# Patient Record
Sex: Female | Born: 1955 | Race: White | Hispanic: No | Marital: Single | Smoking: Current every day smoker
Health system: Southern US, Community
[De-identification: ages and names within clinical notes are randomized; demographics above are authoritative.]

## PROBLEM LIST (undated history)

## (undated) DIAGNOSIS — K7460 Unspecified cirrhosis of liver: Secondary | ICD-10-CM

## (undated) DIAGNOSIS — K649 Unspecified hemorrhoids: Secondary | ICD-10-CM

## (undated) HISTORY — PX: APPENDECTOMY: SHX54

## (undated) HISTORY — PX: EXCISIONAL HEMORRHOIDECTOMY: SHX1541

## (undated) HISTORY — DX: Unspecified hemorrhoids: K64.9

## (undated) HISTORY — PX: TONSILLECTOMY: SUR1361

---

## 2004-01-16 HISTORY — PX: COLONOSCOPY: SHX174

## 2010-09-24 ENCOUNTER — Emergency Department: Payer: Self-pay | Admitting: Emergency Medicine

## 2011-01-05 ENCOUNTER — Emergency Department: Payer: Self-pay | Admitting: Emergency Medicine

## 2014-01-24 ENCOUNTER — Inpatient Hospital Stay: Payer: Self-pay | Admitting: Internal Medicine

## 2014-01-24 LAB — URINALYSIS, COMPLETE
Bilirubin,UR: NEGATIVE
GLUCOSE, UR: NEGATIVE mg/dL (ref 0–75)
Hyaline Cast: 2
Ketone: NEGATIVE
Nitrite: NEGATIVE
Ph: 6 (ref 4.5–8.0)
RBC,UR: 10 /HPF (ref 0–5)
Specific Gravity: 1.013 (ref 1.003–1.030)
Squamous Epithelial: 3
WBC UR: 5 /HPF (ref 0–5)

## 2014-01-24 LAB — COMPREHENSIVE METABOLIC PANEL
ALBUMIN: 3 g/dL — AB (ref 3.4–5.0)
ALK PHOS: 303 U/L — AB
Anion Gap: 12 (ref 7–16)
BUN: 8 mg/dL (ref 7–18)
Bilirubin,Total: 1.8 mg/dL — ABNORMAL HIGH (ref 0.2–1.0)
CALCIUM: 8 mg/dL — AB (ref 8.5–10.1)
Chloride: 104 mmol/L (ref 98–107)
Co2: 25 mmol/L (ref 21–32)
Creatinine: 0.88 mg/dL (ref 0.60–1.30)
EGFR (African American): >60
EGFR (Non-African Amer.): >60
GLUCOSE: 128 mg/dL — AB (ref 65–99)
OSMOLALITY: 281 (ref 275–301)
Potassium: 1.7 mmol/L — CL (ref 3.5–5.1)
SGOT(AST): 120 U/L — ABNORMAL HIGH (ref 15–37)
SGPT (ALT): 36 U/L
Sodium: 141 mmol/L (ref 136–145)
Total Protein: 7.1 g/dL (ref 6.4–8.2)

## 2014-01-24 LAB — POTASSIUM
Potassium: 2.4 mmol/L — CL (ref 3.5–5.1)
Potassium: 1.9 mmol/L — CL (ref 3.5–5.1)

## 2014-01-24 LAB — MAGNESIUM: MAGNESIUM: 1.9 mg/dL

## 2014-01-24 LAB — CBC
HCT: 32.8 % — ABNORMAL LOW (ref 35.0–47.0)
HGB: 10.1 g/dL — ABNORMAL LOW (ref 12.0–16.0)
MCH: 24.8 pg — ABNORMAL LOW (ref 26.0–34.0)
MCHC: 30.8 g/dL — ABNORMAL LOW (ref 32.0–36.0)
MCV: 81 fL (ref 80–100)
Platelet: 152 10*3/uL (ref 150–440)
RBC: 4.07 10*6/uL (ref 3.80–5.20)
RDW: 22.6 % — ABNORMAL HIGH (ref 11.5–14.5)
WBC: 9 10*3/uL (ref 3.6–11.0)

## 2014-01-24 LAB — CK: CK, Total: 782 U/L — ABNORMAL HIGH (ref 26–192)

## 2014-01-24 LAB — TROPONIN I: Troponin-I: <0.02 ng/mL

## 2014-01-25 LAB — COMPREHENSIVE METABOLIC PANEL
ALBUMIN: 2.4 g/dL — AB (ref 3.4–5.0)
ALT: 35 U/L
Alkaline Phosphatase: 247 U/L — ABNORMAL HIGH
Anion Gap: 8 (ref 7–16)
BUN: 7 mg/dL (ref 7–18)
Bilirubin,Total: 1.1 mg/dL — ABNORMAL HIGH (ref 0.2–1.0)
CO2: 27 mmol/L (ref 21–32)
CREATININE: 0.84 mg/dL (ref 0.60–1.30)
Calcium, Total: 7.3 mg/dL — ABNORMAL LOW (ref 8.5–10.1)
Chloride: 108 mmol/L — ABNORMAL HIGH (ref 98–107)
GLUCOSE: 102 mg/dL — AB (ref 65–99)
Osmolality: 283 (ref 275–301)
Potassium: 2.1 mmol/L — CL (ref 3.5–5.1)
SGOT(AST): 112 U/L — ABNORMAL HIGH (ref 15–37)
Sodium: 143 mmol/L (ref 136–145)
Total Protein: 6 g/dL — ABNORMAL LOW (ref 6.4–8.2)

## 2014-01-25 LAB — CK: CK, TOTAL: 1050 U/L — AB (ref 26–192)

## 2014-01-25 LAB — CBC WITH DIFFERENTIAL/PLATELET
Basophil #: 0 10*3/uL (ref 0.0–0.1)
Basophil %: 0.5 %
EOS ABS: 0.1 10*3/uL (ref 0.0–0.7)
Eosinophil %: 2.6 %
HCT: 28.6 % — AB (ref 35.0–47.0)
HGB: 8.7 g/dL — ABNORMAL LOW (ref 12.0–16.0)
LYMPHS PCT: 16.6 %
Lymphocyte #: 0.8 10*3/uL — ABNORMAL LOW (ref 1.0–3.6)
MCH: 24.9 pg — ABNORMAL LOW (ref 26.0–34.0)
MCHC: 30.5 g/dL — ABNORMAL LOW (ref 32.0–36.0)
MCV: 82 fL (ref 80–100)
MONOS PCT: 6.6 %
Monocyte #: 0.3 x10 3/mm (ref 0.2–0.9)
NEUTROS ABS: 3.5 10*3/uL (ref 1.4–6.5)
NEUTROS PCT: 73.7 %
PLATELETS: 105 10*3/uL — AB (ref 150–440)
RBC: 3.5 10*6/uL — ABNORMAL LOW (ref 3.80–5.20)
RDW: 22.5 % — AB (ref 11.5–14.5)
WBC: 4.8 10*3/uL (ref 3.6–11.0)

## 2014-01-25 LAB — POTASSIUM: Potassium: 3 mmol/L — ABNORMAL LOW (ref 3.5–5.1)

## 2014-01-26 LAB — COMPREHENSIVE METABOLIC PANEL
ALT: 54 U/L
AST: 207 U/L — AB (ref 15–37)
Albumin: 2.3 g/dL — ABNORMAL LOW (ref 3.4–5.0)
Alkaline Phosphatase: 252 U/L — ABNORMAL HIGH
Anion Gap: 4 — ABNORMAL LOW (ref 7–16)
BUN: 4 mg/dL — AB (ref 7–18)
Bilirubin,Total: 0.9 mg/dL (ref 0.2–1.0)
Calcium, Total: 7.4 mg/dL — ABNORMAL LOW (ref 8.5–10.1)
Chloride: 116 mmol/L — ABNORMAL HIGH (ref 98–107)
Co2: 25 mmol/L (ref 21–32)
Creatinine: 0.68 mg/dL (ref 0.60–1.30)
EGFR (Non-African Amer.): >60
GLUCOSE: 102 mg/dL — AB (ref 65–99)
Osmolality: 286 (ref 275–301)
Potassium: 4.2 mmol/L (ref 3.5–5.1)
Sodium: 145 mmol/L (ref 136–145)
TOTAL PROTEIN: 5.9 g/dL — AB (ref 6.4–8.2)

## 2014-01-26 LAB — MAGNESIUM: MAGNESIUM: 1.9 mg/dL

## 2014-01-26 LAB — PHOSPHORUS: Phosphorus: 1.1 mg/dL — ABNORMAL LOW (ref 2.5–4.9)

## 2014-05-16 NOTE — H&P (Signed)
PATIENT NAMEBOB, EASTWOOD MR#:  [id number] DATE OF BIRTH:  08/02/1955  REFERRING PHYSICIAN: Eryka A. Edd Fabian, MD  FAMILY PHYSICIAN: None.   REASON FOR ADMISSION: Generalized weakness.   HISTORY OF PRESENT ILLNESS: The patient is a 59 year old female with daily alcohol consumption and moderate tobacco abuse. No significant past medical history. States that she had the flu approximately 1 week ago, which was associated with fevers, chills, nausea, vomiting and diarrhea. This resolved. Also has had some mild chronic bronchitis, continues to smoke. Presents to the Emergency Room today with weakness. Had fallen earlier in the day which she attributes to tripping over her cat. While in the Emergency Room, the patient was noted to have a potassium of 1.7. Denies any recent nausea, vomiting, or diarrhea. Denies any diuretic use. States that she drinks two alcoholic drinks per day and smokes half pack of cigarettes per day. She is now admitted for further evaluation.   PAST MEDICAL HISTORY: 1.  Chronic obstructive pulmonary disease, tobacco abuse.  2.  Moderate alcohol consumption.  3.  Mechanical back pain.  4.  Previous appendectomy.   MEDICATIONS:  None.  ALLERGIES: PENICILLIN.   SOCIAL HISTORY: The patient denies illicit drug use. Smokes half pack per day. Drinks at least 2 alcoholic drinks per day, usually vodka.   FAMILY HISTORY: Positive for hypertension, stroke, and heart disease.   REVIEW OF SYSTEMS:  CONSTITUTIONAL: No fever or change in weight.  EYES: No blurred or double vision. No glaucoma.  EARS, NOSE, THROAT: No tinnitus or hearing loss. No nasal discharge or bleeding. No difficulty swallowing.  RESPIRATORY: Has chronic cough, but no wheezing or hemoptysis. No painful respiration.  CARDIOVASCULAR: No chest pain or orthopnea. No palpitations or syncope.  GASTROINTESTINAL: No nausea, vomiting, or diarrhea. No abdominal pain. No change in bowel habits.  GENITOURINARY: No dysuria or  hematuria. No incontinence.  ENDOCRINE: No polyuria or polydipsia. No heat or cold intolerance.  HEMATOLOGIC: The patient denies anemia, easy bruising, or bleeding.  LYMPHATIC: No swollen glands.  MUSCULOSKELETAL: The patient has back pain, but denies neck, shoulder, knee, or hip pain. No gout.  NEUROLOGIC: No numbness or migraines. Denies stroke or seizures.  PSYCHIATRIC: The patient denies anxiety, insomnia, or depression.   PHYSICAL EXAMINATION: GENERAL: The patient is in no acute distress.  VITAL SIGNS: Currently remarkable for a blood pressure of 112/58, heart rate 87, respiratory rate of 15, temperature of 97.7, saturation 98% on room air.  HEENT: Normocephalic, atraumatic. Pupils are equal, round, and reactive to light and accommodation. Extraocular movements are intact. Sclerae are not icteric. Conjunctivae are clear. Oropharynx is clear. NECK: Supple without JVD. No adenopathy or thyromegaly is noted.  LUNGS: Clear to auscultation and percussion without wheezes, rales, or rhonchi. No dullness. Respiratory effort is normal.  CARDIAC: Regular rate and rhythm with normal S1, S2. No significant rubs, murmurs, or gallops. PMI is nondisplaced. Chest wall is nontender.  ABDOMEN: Soft, nontender, with normoactive bowel sounds. No organomegaly or masses were appreciated. No hernias or bruits were noted.  EXTREMITIES: Without clubbing, cyanosis, or edema. Pulses were 2+ bilaterally.  SKIN: Warm and dry without rash or lesions.  NEUROLOGIC: Cranial nerves II-XII grossly intact. Deep tendon reflexes were symmetric. Motor and sensory examination is nonfocal.  PSYCHIATRIC: Revealed a patient who was alert and oriented to person, place, and time. She was cooperative and used good judgment.   IMAGING: Left hip and lumbar spine films were unremarkable except for degenerative arthritic changes.  Head CT and  cervical spine CT showed no acute injury.   LABORATORY DATA: Urinalysis was unremarkable. Her  white count was 9.0, with a hemoglobin of 10.1. Glucose 128 with a BUN of 8, creatinine of 0.88, and a GFR of greater than 60. Sodium 141 with a potassium of 1.7. Her bilirubin was 1.8 with an alkaline phosphatase of 303 with an ALT of 36 and an AST of 120. Magnesium was 1.9.   ASSESSMENT: 1.  Generalized weakness.  2.  Hypokalemia.  3.  Hyperglycemia.  4.  Abnormal liver function tests.  5.  Moderate alcohol consumption.  6.  Chronic obstructive pulmonary disease and tobacco abuse.  7.  Anemia of chronic disease.   PLAN: The patient will be observed on telemetry. We will supplement her potassium both IV and p.o. Neurologic checks q. 4 hours. PT evaluation in the morning. Follow up routine labs in the morning. The patient does not feel that she needs a NicoDerm patch while she is not smoking in the hospital. We will institute a regular diet now. Further treatment and evaluation will depend upon the patient's progress.  TOTAL TIME SPENT ON THIS PATIENT: 45 minutes.     ____________________________ Leonie Douglas Doy Hutching, MD jds:LT D: 01/24/2014 18:17:18 ET T: 01/24/2014 18:51:06 ET JOB#: [id number]  cc: Leonie Douglas. Doy Hutching, MD, <Dictator> Kenndra Morris Lennice Sites MD ELECTRONICALLY SIGNED 01/25/2014 21:23

## 2014-05-16 NOTE — Discharge Summary (Signed)
PATIENT NAMEKIKUE, [name] MR#:  [id number] DATE OF BIRTH:  1955-03-24  DATE OF ADMISSION:  01/24/2014 DATE OF DISCHARGE:  01/26/2014  DISCHARGE DIAGNOSES: .  1. Severe hypokalemia.  2. Hypophosphatemia.  3. Fall and rhabdomyolysis.  4. Tobacco abuse.   DISCHARGE MEDICATIONS: Percocet 5/325 one tablet every 8 hours p.r.n. for moderate pain as needed. The patient is on Neutra-Phos 2 tablets p.o. b.i.d. 20 tablets are prescribed. The patient has a fall and back pain, so we gave Percocet and 30 tablets are prescribed.   DISPOSITION: Discharged home. The patient refused home physical therapy and walker.   HOSPITAL COURSE: A 59 year old female patient with history of alcohol abuse and drug abuse, comes in because of generalized weakness. The patient was recovering from flu, and the patient noted to have generalized weakness. She was found to have potassium of 1.7. The patient admitted to intensive care unit stepdown because severe hypokalemia and monitored closely for arrthimias.. The patient received IV potassium supplements and received hydration. The patient's weakness thought to be secondary to hypokalemia. The patient's phosphorus and magnesium levels were also checked. The patient's magnesium was 1.9 and phosphorus was 1.1. The patient repeat potassium improved to 4.2 after aggressive p.o. and IV supplementation. The patient also has a history of fall and had back pain, so we had to give her some Percocet. She was seen by physical therapy. They recommended home physical therapy and walker, but she refused therapy and walker, as well. The patient also had rhabdomyolysis due to fall. CK was 1050.  The patient did not have any other fractures, and she went home in stable condition. She improved nicely in respect to hypokalemia and generalized weakness. The patient has no primary doctor, and she is in the process of moving from out of area and she will find one of the local area doctors. She had a CT  head and CT spine, did not show any acute abnormality. The patient also had hip x-ray on the left side, did not show fracture; and lumbar spine x-ray also was done, which showed osteoarthritic changes, but no fracture. The patient went home in stable condition.   TIME SPENT: More than 30 minutes  ADDENDUM:  DISCHARGE PHYSICAL EXAMINATION:  DISCHARGE VITAL SIGNS: Temperature 98 Fahrenheit, heart rate 74, blood pressure 102/69 saturations 98% on room air.  GENERAL: The patient was alert, awake, oriented, not in distress.  HEAD: Atraumatic, normocephalic.  EYES: Pupils equal, reacting to light. Extraocular movements intact.  CARDIOVASCULAR SYSTEM: S1, S2 regular.  LUNGS: Clear.  NEUROLOGICAL: Awake, alert, oriented. No focal neurological deficits.   TIME SPENT: More than 30 minutes    ____________________________ Epifanio Lesches, MD sk:mw D: 01/27/2014 08:25:38 ET T: 01/27/2014 13:37:05 ET JOB#: [id number]  cc: Epifanio Lesches, MD, <Dictator> Epifanio Lesches MD ELECTRONICALLY SIGNED 02/16/2014 11:47

## 2014-05-21 ENCOUNTER — Inpatient Hospital Stay
Admission: EM | Admit: 2014-05-21 | Discharge: 2014-05-25 | DRG: 432 | Disposition: A | Discharge status: Home / Self Care | Payer: Medicaid Other | Attending: Internal Medicine | Admitting: Internal Medicine

## 2014-05-21 ENCOUNTER — Emergency Department: Payer: Medicaid Other

## 2014-05-21 DIAGNOSIS — R7301 Impaired fasting glucose: Secondary | ICD-10-CM | POA: Diagnosis present

## 2014-05-21 DIAGNOSIS — D638 Anemia in other chronic diseases classified elsewhere: Secondary | ICD-10-CM | POA: Diagnosis present

## 2014-05-21 DIAGNOSIS — Z880 Allergy status to penicillin: Secondary | ICD-10-CM | POA: Diagnosis not present

## 2014-05-21 DIAGNOSIS — Z791 Long term (current) use of non-steroidal anti-inflammatories (NSAID): Secondary | ICD-10-CM

## 2014-05-21 DIAGNOSIS — R140 Abdominal distension (gaseous): Secondary | ICD-10-CM

## 2014-05-21 DIAGNOSIS — K7031 Alcoholic cirrhosis of liver with ascites: Principal | ICD-10-CM

## 2014-05-21 DIAGNOSIS — F17210 Nicotine dependence, cigarettes, uncomplicated: Secondary | ICD-10-CM | POA: Diagnosis present

## 2014-05-21 DIAGNOSIS — F1010 Alcohol abuse, uncomplicated: Secondary | ICD-10-CM | POA: Diagnosis present

## 2014-05-21 DIAGNOSIS — K652 Spontaneous bacterial peritonitis: Secondary | ICD-10-CM | POA: Diagnosis present

## 2014-05-21 DIAGNOSIS — E876 Hypokalemia: Secondary | ICD-10-CM

## 2014-05-21 DIAGNOSIS — D696 Thrombocytopenia, unspecified: Secondary | ICD-10-CM | POA: Diagnosis present

## 2014-05-21 DIAGNOSIS — R1900 Intra-abdominal and pelvic swelling, mass and lump, unspecified site: Secondary | ICD-10-CM

## 2014-05-21 HISTORY — DX: Unspecified cirrhosis of liver: K74.60

## 2014-05-21 LAB — CBC WITH DIFFERENTIAL/PLATELET
BASOS ABS: 0 10*3/uL (ref 0–0.1)
BASOS PCT: 1 %
EOS ABS: 0.1 10*3/uL (ref 0–0.7)
Eosinophils Relative: 2 %
HCT: 29.8 % — ABNORMAL LOW (ref 35.0–47.0)
Hemoglobin: 9.3 g/dL — ABNORMAL LOW (ref 12.0–16.0)
Lymphocytes Relative: 20 %
Lymphs Abs: 1.2 10*3/uL (ref 1.0–3.6)
MCH: 26.3 pg (ref 26.0–34.0)
MCHC: 31.2 g/dL — ABNORMAL LOW (ref 32.0–36.0)
MCV: 84.2 fL (ref 80.0–100.0)
Monocytes Absolute: 0.7 10*3/uL (ref 0.2–0.9)
Monocytes Relative: 12 %
Neutro Abs: 3.8 10*3/uL (ref 1.4–6.5)
Neutrophils Relative %: 65 %
PLATELETS: 142 10*3/uL — AB (ref 150–440)
RBC: 3.54 MIL/uL — ABNORMAL LOW (ref 3.80–5.20)
RDW: 26.3 % — AB (ref 11.5–14.5)
WBC: 5.9 10*3/uL (ref 3.6–11.0)

## 2014-05-21 LAB — URINALYSIS COMPLETE WITH MICROSCOPIC (ARMC ONLY)
Bacteria, UA: NONE SEEN
Bilirubin Urine: NEGATIVE
Glucose, UA: NEGATIVE mg/dL
Hgb urine dipstick: NEGATIVE
KETONES UR: NEGATIVE mg/dL
Leukocytes, UA: NEGATIVE
NITRITE: NEGATIVE
PROTEIN: NEGATIVE mg/dL
Specific Gravity, Urine: 1.001 — ABNORMAL LOW (ref 1.005–1.030)
pH: 7 (ref 5.0–8.0)

## 2014-05-21 LAB — COMPREHENSIVE METABOLIC PANEL
ALT: 16 U/L (ref 14–54)
ALT: 15 U/L (ref 14–54)
ANION GAP: 11 (ref 5–15)
ANION GAP: 9 (ref 5–15)
AST: 75 U/L — AB (ref 15–41)
AST: 66 U/L — ABNORMAL HIGH (ref 15–41)
Albumin: 2.3 g/dL — ABNORMAL LOW (ref 3.5–5.0)
Albumin: 2 g/dL — ABNORMAL LOW (ref 3.5–5.0)
Alkaline Phosphatase: 374 U/L — ABNORMAL HIGH (ref 38–126)
Alkaline Phosphatase: 335 U/L — ABNORMAL HIGH (ref 38–126)
BUN: <5 mg/dL — ABNORMAL LOW (ref 6–20)
BUN: <5 mg/dL — ABNORMAL LOW (ref 6–20)
CALCIUM: 7.6 mg/dL — AB (ref 8.9–10.3)
CHLORIDE: 103 mmol/L (ref 101–111)
CO2: 23 mmol/L (ref 22–32)
CO2: 25 mmol/L (ref 22–32)
Calcium: 7.7 mg/dL — ABNORMAL LOW (ref 8.9–10.3)
Chloride: 104 mmol/L (ref 101–111)
Creatinine, Ser: 0.62 mg/dL (ref 0.44–1.00)
Creatinine, Ser: 0.57 mg/dL (ref 0.44–1.00)
GFR calc Af Amer: >60 mL/min (ref >60)
GFR calc non Af Amer: >60 mL/min (ref >60)
GLUCOSE: 142 mg/dL — AB (ref 65–99)
Glucose, Bld: 126 mg/dL — ABNORMAL HIGH (ref 65–99)
POTASSIUM: 2.1 mmol/L — AB (ref 3.5–5.1)
Potassium: 2 mmol/L — CL (ref 3.5–5.1)
SODIUM: 137 mmol/L (ref 135–145)
Sodium: 138 mmol/L (ref 135–145)
Total Bilirubin: 2.3 mg/dL — ABNORMAL HIGH (ref 0.3–1.2)
Total Bilirubin: 1.8 mg/dL — ABNORMAL HIGH (ref 0.3–1.2)
Total Protein: 7.1 g/dL (ref 6.5–8.1)
Total Protein: 6.2 g/dL — ABNORMAL LOW (ref 6.5–8.1)

## 2014-05-21 LAB — CBC
HCT: 26.7 % — ABNORMAL LOW (ref 35.0–47.0)
HEMOGLOBIN: 8.7 g/dL — AB (ref 12.0–16.0)
MCH: 27.2 pg (ref 26.0–34.0)
MCHC: 32.4 g/dL (ref 32.0–36.0)
MCV: 84 fL (ref 80.0–100.0)
Platelets: 111 10*3/uL — ABNORMAL LOW (ref 150–440)
RBC: 3.18 MIL/uL — AB (ref 3.80–5.20)
RDW: 26.6 % — ABNORMAL HIGH (ref 11.5–14.5)
WBC: 3.7 10*3/uL (ref 3.6–11.0)

## 2014-05-21 LAB — ETHANOL: Alcohol, Ethyl (B): 130 mg/dL — ABNORMAL HIGH (ref <5)

## 2014-05-21 LAB — MAGNESIUM: Magnesium: 1.8 mg/dL (ref 1.7–2.4)

## 2014-05-21 LAB — LIPASE, BLOOD: Lipase: 27 U/L (ref 22–51)

## 2014-05-21 MED ORDER — THIAMINE HCL 100 MG/ML IJ SOLN
INTRAMUSCULAR | Status: AC
  Filled 2014-05-21: qty 2

## 2014-05-21 MED ORDER — THIAMINE HCL 100 MG/ML IJ SOLN
100.0000 mg | Freq: Every day | INTRAMUSCULAR | Status: DC
  Administered 2014-05-21 – 2014-05-25 (×5): 100 mg via INTRAVENOUS
  Filled 2014-05-21 (×4): qty 1

## 2014-05-21 MED ORDER — ONDANSETRON HCL 4 MG PO TABS
4.0000 mg | ORAL_TABLET | Freq: Four times a day (QID) | ORAL | Status: DC | PRN
  Administered 2014-05-21: 4 mg via ORAL
  Filled 2014-05-21: qty 1

## 2014-05-21 MED ORDER — LORAZEPAM 1 MG PO TABS
1.0000 mg | ORAL_TABLET | ORAL | Status: DC | PRN
  Administered 2014-05-21: 22:00:00 1 mg via ORAL
  Filled 2014-05-21: qty 1

## 2014-05-21 MED ORDER — OCUVITE PO TABS
1.0000 | ORAL_TABLET | Freq: Every day | ORAL | Status: DC
  Administered 2014-05-24: 13:00:00 1 via ORAL
  Filled 2014-05-21 (×3): qty 1

## 2014-05-21 MED ORDER — ONDANSETRON HCL 4 MG/2ML IJ SOLN: 4.0000 mg | Freq: Four times a day (QID) | INTRAMUSCULAR | Status: DC | PRN

## 2014-05-21 MED ORDER — FOLIC ACID 1 MG PO TABS
1.0000 mg | ORAL_TABLET | Freq: Every day | ORAL | Status: DC
  Administered 2014-05-21 – 2014-05-25 (×5): 1 mg via ORAL
  Filled 2014-05-21 (×4): qty 1

## 2014-05-21 MED ORDER — POTASSIUM PHOSPHATES 15 MMOLE/5ML IV SOLN
40.0000 meq | Freq: Once | INTRAVENOUS | Status: AC
  Administered 2014-05-21: 40 meq via INTRAVENOUS
  Filled 2014-05-21: qty 9.09

## 2014-05-21 MED ORDER — POTASSIUM CHLORIDE 20 MEQ PO PACK
20.0000 meq | PACK | Freq: Two times a day (BID) | ORAL | Status: DC
  Administered 2014-05-21 – 2014-05-23 (×5): 20 meq via ORAL
  Filled 2014-05-21 (×4): qty 1

## 2014-05-21 MED ORDER — MAGNESIUM OXIDE 400 (241.3 MG) MG PO TABS
400.0000 mg | ORAL_TABLET | Freq: Two times a day (BID) | ORAL | Status: DC
  Administered 2014-05-21 – 2014-05-25 (×8): 400 mg via ORAL
  Filled 2014-05-21 (×8): qty 1

## 2014-05-21 MED ORDER — SENNA 8.6 MG PO TABS
1.0000 | ORAL_TABLET | Freq: Two times a day (BID) | ORAL | Status: DC
  Administered 2014-05-21 – 2014-05-24 (×7): 8.6 mg via ORAL
  Filled 2014-05-21 (×8): qty 1

## 2014-05-21 MED ORDER — FOLIC ACID 1 MG PO TABS
ORAL_TABLET | ORAL | Status: AC
  Filled 2014-05-21: qty 1

## 2014-05-21 MED ORDER — SPIRONOLACTONE 25 MG PO TABS
12.5000 mg | ORAL_TABLET | Freq: Every day | ORAL | Status: DC
  Administered 2014-05-22 – 2014-05-23 (×2): 12.5 mg via ORAL
  Administered 2014-05-24: 13:00:00 via ORAL
  Filled 2014-05-21 (×4): qty 1

## 2014-05-21 MED ORDER — ENOXAPARIN SODIUM 40 MG/0.4ML ~~LOC~~ SOLN
40.0000 mg | SUBCUTANEOUS | Status: DC
  Administered 2014-05-23: 40 mg via SUBCUTANEOUS
  Filled 2014-05-21 (×3): qty 0.4

## 2014-05-21 MED ORDER — POTASSIUM CHLORIDE 20 MEQ PO PACK
PACK | ORAL | Status: AC
  Administered 2014-05-21: 20 meq via ORAL
  Filled 2014-05-21: qty 1

## 2014-05-21 MED ORDER — OXYCODONE HCL 5 MG PO TABS
5.0000 mg | ORAL_TABLET | ORAL | Status: DC | PRN
Start: 2014-05-21 — End: 2014-05-25
  Administered 2014-05-21 – 2014-05-25 (×9): 5 mg via ORAL
  Filled 2014-05-21 (×9): qty 1

## 2014-05-21 NOTE — H&P (Signed)
[place] [facility] [place] NAME: [name]    MR#:  [id number]  DATE OF BIRTH:  1955/05/04  DATE OF ADMISSION:  05/21/2014  PRIMARY CARE PHYSICIAN: No primary care provider on file. on the patient's Medicaid card is the [facility].  REQUESTING/REFERRING PHYSICIAN: Dr. Thomasene Lot  CHIEF COMPLAINT:   Chief Complaint  Patient presents with  . Bloated  . Abdominal Pain    HISTORY OF PRESENT ILLNESS:  [name]  is a 59 y.o. female with with medical history of cirrhosis she presents with a gradual onset of abdominal bloating. She's been having abdominal pain for the past week scaled 10 out of 10 intensity all over her abdomen. She is very distended, she had a few episodes of diarrhea. No nausea or vomiting and no blood in the bowel movements. She is feeling very weak.  In the ER, she was found to have a very low potassium. An ultrasound of the abdomen showed ascites and nodular contour of the liver which could be cirrhosis.  PAST MEDICAL HISTORY:   Past Medical History  Diagnosis Date  . Cirrhosis of liver     PAST SURGICAL HISTORY:   Past Surgical History  Procedure Laterality Date  . Appendectomy    . Tonsillectomy      SOCIAL HISTORY:   History  Substance Use Topics  . Smoking status: Current Every Day Smoker -- 0.50 packs/day    Types: Cigarettes  . Smokeless tobacco: Never Used  . Alcohol Use: 12.6 oz/week    21 Shots of liquor per week    FAMILY HISTORY:   Family History  Problem Relation Age of Onset  . Hypertension Mother   . Dementia Father     DRUG ALLERGIES:   Allergies  Allergen Reactions  . Penicillins Hives    REVIEW OF SYSTEMS:  CONSTITUTIONAL: No fever, positive for weakness, positive for cold feeling. Positive for weight gain.  EYES: No blurred or double vision.  EARS, NOSE, AND THROAT: No tinnitus or ear pain. No sore throat RESPIRATORY: No cough, shortness of breath, wheezing  or hemoptysis.  CARDIOVASCULAR: No chest pain, orthopnea, edema.  GASTROINTESTINAL: No nausea, vomiting, or diarrhea. Positive for abdominal pain all over her abdomen. No blood in bowel movements GENITOURINARY: No dysuria, hematuria.  ENDOCRINE: No polyuria, nocturia,  HEMATOLOGY: No anemia, positive for easy bruising. SKIN: No rash or lesion. MUSCULOSKELETAL: No joint pain or arthritis.   NEUROLOGIC: No tingling, numbness, weakness.  PSYCHIATRY: Positive for depression. No thoughts of hurting herself or other people.  MEDICATIONS AT HOME:   Prior to Admission medications   Medication Sig Start Date End Date Taking? Authorizing Provider  naproxen sodium (ANAPROX) 220 MG tablet Take 220 mg by mouth 2 (two) times daily as needed (for back pain).   Yes Historical Provider, MD      VITAL SIGNS:  Blood pressure 114/65, pulse 98, temperature 97.9 F (36.6 C), temperature source Oral, resp. rate 20, height 5\' 4"  (1.626 m), weight 63.504 kg (140 lb), SpO2 98 %.  PHYSICAL EXAMINATION:  GENERAL:  59 y.o.-year-old patient lying in the bed with no acute distress.  EYES: Pupils equal, round, reactive to light and accommodation. No scleral icterus. Extraocular muscles intact.  HEENT: Head atraumatic, normocephalic. Oropharynx and nasopharynx clear.  NECK:  Supple, no jugular venous distention. No thyroid enlargement, no tenderness.  LUNGS: Normal breath sounds bilaterally, no wheezing, rales,rhonchi or crepitation. No use of accessory muscles of respiration.  CARDIOVASCULAR: S1, S2 normal tachycardic. No murmurs, rubs, or gallops.  ABDOMEN: Soft, generalized tenderness, severe distention with positive fluid wave. Bowel sounds present. Hard to assess for hepatomegaly secondary to massive ascites. EXTREMITIES: 2+ edema, no cyanosis, or clubbing.  NEUROLOGIC: Cranial nerves II through XII are intact. Muscle strength 5/5 in all extremities. Sensation intact. Gait not checked.  PSYCHIATRIC: The patient  is alert and oriented x 3.  SKIN: No rash, lesion, or ulcer.   LABORATORY PANEL:   CBC  Recent Labs Lab 05/21/14 1358  WBC 5.9  HGB 9.3*  HCT 29.8*  PLT 142*   Chemistries   Recent Labs Lab 05/21/14 1358 05/21/14 1533  NA 137  --   K 2.0*  --   CL 103  --   CO2 23  --   GLUCOSE 126*  --   BUN <5*  --   CREATININE 0.62  --   CALCIUM 7.7*  --   MG  --  1.8  AST 75*  --   ALT 16  --   ALKPHOS 374*  --   BILITOT 2.3*  --    RADIOLOGY:  US Abdomen Limited Ruq  05/21/2014   CLINICAL DATA:  Abdominal pain for 2 days  EXAM: US ABDOMEN LIMITED - RIGHT UPPER QUADRANT  COMPARISON:  None.  FINDINGS: Gallbladder:  No gallstones are noted within gallbladder. Borderline thickening of gallbladder wall up to 3 mm. There is no sonographic Murphy's sign.  Common bile duct:  Diameter: 3 mm in diameter within normal limits.  Liver:  No focal hepatic mass. There is diffuse increased echogenicity of the liver. Nodular contour of the liver is noted. Chronic liver disease or early cirrhosis cannot be excluded. Clinical correlation is necessary. Perihepatic ascites is noted.  IMPRESSION: 1. No gallstones are noted within gallbladder. 2. Normal CBD.  No sonographic Murphy's sign. 3. Diffuse increased echogenicity of the liver with nodular contour. Chronic liver disease or early cirrhosis cannot be excluded. Perihepatic ascites. Clinical correlation is necessary.   Electronically Signed   By: Lahoma Crocker M.D.   On: 05/21/2014 17:12   IMPRESSION AND PLAN:   #1 severe hypokalemia -IV and oral replacement needed. -I ordered magnesium level. This is borderline low and I will give oral supplementation also. #2 massive ascites with alcohol cirrhosis -I ordered interventional radiology paracentesis for tomorrow morning. -Low-dose Aldactone started.  #3 alcohol abuse -Oral [place] protocol ordered -Must stop drinking alcohol. #4 tobacco abuse smoking cessation counseling done 3 minutes by me, she refused  nicotine patch. #5 elevated liver function test, thrombocytopenia, poor nutritional status all secondary to alcohol cirrhosis. #6 anemia likely of chronic disease #7 impaired fasting glucose-I will check a hemoglobin A1c  All the records are reviewed and case discussed with ED provider. Management plans discussed with the patient, and she is in agreement.  CODE STATUS: Full code  TOTAL TIME TAKING CARE OF THIS PATIENT: 55 minutes.    Loletha Grayer M.D on 05/21/2014 at 7:00 PM  Between 7am to 6pm - Pager - [phone]  After 6pm call admission pager [facility]  Office  [phone]  CC: Primary care physician; No primary care provider on file.  on the patient's Medicaid card is the [facility].

## 2014-05-21 NOTE — ED Provider Notes (Signed)
[facility] Emergency Department Provider Note  ____________________________________________  Time seen: 1545  I have reviewed the triage vital signs and the nursing notes.   HISTORY  Chief Complaint Bloated and Abdominal Pain     HPI [name] is a 59 y.o. female who has a long history of alcohol abuse and some prior episodes of low potassium and distended abdomen. Today she presents with her abdomen increasing in size. She says it has been getting bigger over the past few months but much faster over the past week and half.There is some general discomfort with her abdomen but no severe pain. She is not nauseous or vomiting. She reports a similar episode a number of years ago. At that time it sounds like they did a paracentesis.   She reports she still drink alcohol but not as much as she used to. Her last drink was yesterday. She was admitted to the hospital in January with a very low potassium level of 1.7.     Past Medical History  Diagnosis Date  . Cirrhosis of liver     There are no active problems to display for this patient.   Past Surgical History  Procedure Laterality Date  . Appendectomy    . Tonsillectomy      Current Outpatient Rx  Name  Route  Sig  Dispense  Refill  . naproxen sodium (ANAPROX) 220 MG tablet   Oral   Take 220 mg by mouth 2 (two) times daily as needed (for back pain).           Allergies Penicillins  No family history on file.  Social History History  Substance Use Topics  . Smoking status: Current Every Day Smoker -- 0.50 packs/day    Types: Cigarettes  . Smokeless tobacco: Never Used  . Alcohol Use: 1.8 - 2.4 oz/week    3-4 Shots of liquor per week    Review of Systems Constitutional: Negative for fever. ENT: Negative for sore throat. Cardiovascular: Negative for chest pain. Respiratory: Negative for shortness of breath. History of COPD Gastrointestinal: Positive for abdominal distention and  for general discomfort. Genitourinary: Negative for dysuria. Musculoskeletal: Negative for back pain. Skin: Negative for rash. Neurological: Negative for headaches Psychiatric:Patient continues to drink alcohol.  10-point ROS otherwise negative.  ____________________________________________   PHYSICAL EXAM:  VITAL SIGNS: ED Triage Vitals  Enc Vitals Group     BP 05/21/14 1351 123/66 mmHg     Pulse Rate 05/21/14 1351 103     Resp 05/21/14 1351 17     Temp 05/21/14 1351 97.9 F (36.6 C)     Temp Source 05/21/14 1351 Oral     SpO2 05/21/14 1351 97 %     Weight 05/21/14 1351 140 lb (63.504 kg)     Height 05/21/14 1351 5\' 4"  (1.626 m)     Head Cir --      Peak Flow --      Pain Score 05/21/14 1352 10     Pain Loc --      Pain Edu? --      Excl. in [place]? --     Constitutional: Alert and oriented. Well appearing and in no distress. Pleasant and communicative. Patient is straightforward about her past history, including her alcohol issues. ENT   Head: Normocephalic and atraumatic.   Nose: No congestion/rhinnorhea.   Mouth/Throat: Mucous membranes are moist. Cardiovascular: Normal rate, regular rhythm. Respiratory: Normal respiratory effort without tachypnea. Breath sounds are clear and equal bilaterally. No  wheezes/rales/rhonchi. Gastrointestinal: Notable for distention. Patient has a large amount of ascites. Musculoskeletal: Nontender with normal range of motion in all extremities.  No noted edema. Neurologic:  Normal speech and language. No gross focal neurologic deficits are appreciated.  Skin:  Skin is warm, dry. No rash noted. Psychiatric: Mood and affect are normal. Speech and behavior are normal.  Patient appears to have good insight. She confirms that she continues to drink, but not as much as she used to. ____________________________________________     LABS (pertinent positives/negatives)  White blood cell count of 5.9 and hemoglobin of 9.3. Electrolytes  are notable for potassium level of 2.0. Sodium of 137. BUNs less than 5. Her AST is only 75, her alkaline phosphatase is 374.  ____________________________________________   EKG    ____________________________________________    RADIOLOGY  Abdominal ultrasound, evaluate for cirrhosis: IMPRESSION: 1. No gallstones are noted within gallbladder. 2. Normal CBD. No sonographic Murphy's sign. 3. Diffuse increased echogenicity of the liver with nodular contour. Chronic liver disease or early cirrhosis cannot be excluded. Perihepatic ascites. Clinical correlation is necessary.   ____________________________________________   PROCEDURES  Procedure(s) performed: None  Critical Care performed: No  ____________________________________________   INITIAL IMPRESSION / ASSESSMENT AND PLAN / ED COURSE  With the notable low potassium level will begin potassium replacement, both IV and by mouth. The patient denies a knowledge of liver cirrhosis. While in ultrasound is not indicated for her ascites as it is clinically obvious, we will do an ultrasound to look at her liver. We will speak with the hospitalist for admission due to the need for a therapeutic tap due to the excessive distention of her abdomen.  ----------------------------------------- 6:10 PM on 05/21/2014 -----------------------------------------  Rechecked patient at [facility] patient. She is comfortable with no acute distress. Ultrasound has been performed which shows diffuse increased echogenicity, possible early cirrhosis. There are no gallstones and the common bile duct looks normal. She has been continuing to get her potassium. We will speak with the hospitalist for admission.  ____________________________________________   FINAL CLINICAL IMPRESSION(S) / ED DIAGNOSES  Final diagnoses:  Swelling abdomen  Ascites due to alcoholic cirrhosis  Hypokalemia      Ahmed Prima, MD 05/21/14 1816

## 2014-05-21 NOTE — ED Notes (Signed)
CRITICAL VALUE ALERT  Critical value received:  K+ 2  Date of notification:  05/21/2014  Time of notification:  4514  Critical value read back:Yes.    Nurse who received alert:  Virgilio Belling RN  MD notified (1st page):  1515  Time of first page:   MD notified (2nd page):  Time of second page:  Responding MD Dr Wonda Horner  Time MD responded:  [phone]

## 2014-05-21 NOTE — ED Notes (Signed)
Pt c/o abd distention for the past 2 weeks with a hx of liver cirrhosis, states it has been about 7 years since the last time she had to have fluid drained from her abdomen.. Denies N/V, but c/o diarrhea.Marland Kitchen

## 2014-05-22 LAB — CBC
HCT: 26.4 % — ABNORMAL LOW (ref 35.0–47.0)
Hemoglobin: 8.4 g/dL — ABNORMAL LOW (ref 12.0–16.0)
MCH: 26.6 pg (ref 26.0–34.0)
MCHC: 31.7 g/dL — ABNORMAL LOW (ref 32.0–36.0)
MCV: 83.8 fL (ref 80.0–100.0)
PLATELETS: 106 10*3/uL — AB (ref 150–440)
RBC: 3.15 MIL/uL — AB (ref 3.80–5.20)
RDW: 26.8 % — ABNORMAL HIGH (ref 11.5–14.5)
WBC: 3.5 10*3/uL — AB (ref 3.6–11.0)

## 2014-05-22 LAB — BASIC METABOLIC PANEL
Anion gap: 7 (ref 5–15)
BUN: 5 mg/dL — AB (ref 6–20)
CALCIUM: 8.6 mg/dL — AB (ref 8.9–10.3)
CO2: 27 mmol/L (ref 22–32)
Chloride: 103 mmol/L (ref 101–111)
Creatinine, Ser: 0.75 mg/dL (ref 0.44–1.00)
Glucose, Bld: 93 mg/dL (ref 65–99)
Potassium: 2.5 mmol/L — CL (ref 3.5–5.1)
Sodium: 137 mmol/L (ref 135–145)

## 2014-05-22 LAB — PROTIME-INR
INR: 1.36
Prothrombin Time: 17 seconds — ABNORMAL HIGH (ref 11.4–15.0)

## 2014-05-22 LAB — APTT: aPTT: 36 seconds (ref 24–36)

## 2014-05-22 LAB — HEMOGLOBIN A1C: Hgb A1c MFr Bld: 5.5 % (ref 4.0–6.0)

## 2014-05-22 LAB — MAGNESIUM: MAGNESIUM: 1.9 mg/dL (ref 1.7–2.4)

## 2014-05-22 MED ORDER — FUROSEMIDE 10 MG/ML IJ SOLN
20.0000 mg | Freq: Two times a day (BID) | INTRAMUSCULAR | Status: DC
  Administered 2014-05-22 – 2014-05-25 (×4): 20 mg via INTRAVENOUS
  Filled 2014-05-22 (×6): qty 2

## 2014-05-22 NOTE — Plan of Care (Signed)
Problem: Discharge Progression Outcomes Goal: Other Discharge Outcomes/Goals Outcome: Progressing Care Plan note: Pain: Patient complained of abdominal pain - medicated x1 to good effect. Hemodynamics: Vital signs stable, blood pressure low 709 - 643 systolic. Tele SR 80-90. Diet: Was NPO - changed to 2gNA today - tolerating diet well. Activity: Up and about in room. Large distended abdomen due to ascites. Started on Lasix this evening - hat placed in toilet to monitor output.

## 2014-05-22 NOTE — Plan of Care (Signed)
Problem: Discharge Progression Outcomes Goal: Discharge plan in place and appropriate Individualization  Patient likes to be called [name], she is a moderate fall risk, has history of cirrhosis, alcoholism, and she is a heavy smoker.  Goal: Other Discharge Outcomes/Goals Plan of care: progress to goal: Hypokalemia, Potassium iv bolus administered, po potassium as well , with slight improvement at this time.

## 2014-05-22 NOTE — Progress Notes (Signed)
[place] at [facility]                                                                                                                                                                                            Patient Demographics   [name], is a 59 y.o. female, DOB - 1956-01-06, YJE:[id number]  Admit date - 05/21/2014   Admitting Physician Loletha Grayer, MD  Outpatient Primary MD for the patient is No primary care provider on file.    Chief Complaint  Patient presents with  . Bloated  . Abdominal Pain      Pt admited with abdominal destention and swelling, noted to have ascites. Feels little better still abdominal pain.   Review of Systems:   CONSTITUTIONAL: No documented fever. No fatigue, weakness. No weight gain, no weight loss.  EYES: No blurry or double vision.  ENT: No tinnitus. No postnasal drip. No redness of the oropharynx.  RESPIRATORY: No cough, no wheeze, no hemoptysis. No dyspnea.  CARDIOVASCULAR: No chest pain. No orthopnea. No palpitations. No syncope.  GASTROINTESTINAL: No nausea, no vomiting or diarrhea. No abdominal pain. No melena or hematochezia.  GENITOURINARY: No dysuria or hematuria.  ENDOCRINE: No polyuria or nocturia. No heat or cold intolerance.  HEMATOLOGY: No anemia. No bruising. No bleeding.  INTEGUMENTARY: No rashes. No lesions.  MUSCULOSKELETAL: No arthritis. No swelling. No gout.  NEUROLOGIC: No numbness, tingling, or ataxia. No seizure-type activity.  PSYCHIATRIC: No anxiety. No insomnia. No ADD.    Vitals:   Filed Vitals:   05/21/14 2223 05/21/14 2300 05/22/14 0500 05/22/14 0519  BP: 100/63   106/65  Pulse: 103 91  93  Temp: 99 F (37.2 C)   98.1 F (36.7 C)  TempSrc: Oral   Oral  Resp:    16  Height:      Weight:   69.003 kg (152 lb 2 oz)   SpO2: 95%   95%    Wt Readings from Last 3 Encounters:  05/22/14 69.003 kg (152 lb 2 oz)    No intake or output data in the 24 hours ending  05/22/14 1422  Physical Exam:   GENERAL: Pleasant-appearing in no apparent distress.  HEAD, EYES, EARS, NOSE AND THROAT: Atraumatic, normocephalic. Extraocular muscles are intact. Pupils equal and reactive to light. Sclerae anicteric. No conjunctival injection. No oro-pharyngeal erythema.  NECK: Supple. There is no jugular venous distention. No bruits, no lymphadenopathy, no thyromegaly.  HEART: Regular rate and rhythm, tachycardic. No murmurs, no rubs, no clicks.  LUNGS: Clear to auscultation bilaterally. No rales or rhonchi. No wheezes.  ABDOMEN: Distened, + BS  x 4 EXTREMITIES: No evidence of any cyanosis, clubbing, or peripheral edema.  +2 pedal and radial pulses bilaterally.  NEUROLOGIC: The patient is alert, awake, and oriented x3 with no focal motor or sensory deficits appreciated bilaterally.  SKIN: Moist and warm with no rashes appreciated.     Antibiotics   Anti-infectives    None      Medications   Scheduled Meds: . beta carotene w/minerals  1 tablet Oral Daily  . enoxaparin (LOVENOX) injection  40 mg Subcutaneous Q24H  . folic acid  1 mg Oral Daily  . furosemide  20 mg Intravenous BID  . magnesium oxide  400 mg Oral BID  . potassium chloride  20 mEq Oral BID  . senna  1 tablet Oral BID  . spironolactone  12.5 mg Oral Daily  . thiamine IV  100 mg Intravenous Daily   Continuous Infusions:  PRN Meds:.LORazepam, ondansetron **OR** ondansetron (ZOFRAN) IV, oxyCODONE   Data Review:   Micro Results No results found for this or any previous visit (from the past 240 hour(s)).  Radiology Reports US Abdomen Limited Ruq  05/21/2014   CLINICAL DATA:  Abdominal pain for 2 days  EXAM: US ABDOMEN LIMITED - RIGHT UPPER QUADRANT  COMPARISON:  None.  FINDINGS: Gallbladder:  No gallstones are noted within gallbladder. Borderline thickening of gallbladder wall up to 3 mm. There is no sonographic Murphy's sign.  Common bile duct:  Diameter: 3 mm in diameter within normal limits.   Liver:  No focal hepatic mass. There is diffuse increased echogenicity of the liver. Nodular contour of the liver is noted. Chronic liver disease or early cirrhosis cannot be excluded. Clinical correlation is necessary. Perihepatic ascites is noted.  IMPRESSION: 1. No gallstones are noted within gallbladder. 2. Normal CBD.  No sonographic Murphy's sign. 3. Diffuse increased echogenicity of the liver with nodular contour. Chronic liver disease or early cirrhosis cannot be excluded. Perihepatic ascites. Clinical correlation is necessary.   Electronically Signed   By: Lahoma Crocker M.D.   On: 05/21/2014 17:12     CBC  Recent Labs Lab 05/21/14 1358 05/21/14 2109 05/22/14 0559  WBC 5.9 3.7 3.5*  HGB 9.3* 8.7* 8.4*  HCT 29.8* 26.7* 26.4*  PLT 142* 111* 106*  MCV 84.2 84.0 83.8  MCH 26.3 27.2 26.6  MCHC 31.2* 32.4 31.7*  RDW 26.3* 26.6* 26.8*  LYMPHSABS 1.2  --   --   MONOABS 0.7  --   --   EOSABS 0.1  --   --   BASOSABS 0.0  --   --     Chemistries   Recent Labs Lab 05/21/14 1358 05/21/14 1533 05/21/14 2109  NA 137  --  138  K 2.0*  --  2.1*  CL 103  --  104  CO2 23  --  25  GLUCOSE 126*  --  142*  BUN <5*  --  <5*  CREATININE 0.62  --  0.57  CALCIUM 7.7*  --  7.6*  MG  --  1.8  --   AST 75*  --  66*  ALT 16  --  15  ALKPHOS 374*  --  335*  BILITOT 2.3*  --  1.8*   ------------------------------------------------------------------------------------------------------------------ estimated creatinine clearance is 71.3 mL/min (by C-G formula based on Cr of 0.57). ------------------------------------------------------------------------------------------------------------------ No results for input(s): HGBA1C in the last 72 hours. ------------------------------------------------------------------------------------------------------------------ No results for input(s): CHOL, HDL, LDLCALC, TRIG, CHOLHDL, LDLDIRECT in the last 72  hours. ------------------------------------------------------------------------------------------------------------------ No results for input(s):  TSH, T4TOTAL, T3FREE, THYROIDAB in the last 72 hours.  Invalid input(s): FREET3 ------------------------------------------------------------------------------------------------------------------ No results for input(s): VITAMINB12, FOLATE, FERRITIN, TIBC, IRON, RETICCTPCT in the last 72 hours.  Coagulation profile  Recent Labs Lab 05/22/14 0559  INR 1.36    No results for input(s): DDIMER in the last 72 hours.  Cardiac Enzymes No results for input(s): CKMB, TROPONINI, MYOGLOBIN in the last 168 hours.  Invalid input(s): CK ------------------------------------------------------------------------------------------------------------------ Invalid input(s): [place]   #1 severe hypokalemia -stat bmp check now, replace based on k+  #2 massive ascites with alcohol cirrhosis Ir not availbe to do Paracentits it will be done on Monday resume diet  #3 alcohol abuse -Oral Ciwa protocol ordered -Must stop drinking alcohol. #4 tobacco abuse smoking cessation counseling done   #5 elevated liver function test, thrombocytopenia, poor nutritional status all secondary to alcohol cirrhosis. #6 anemia likely of chronic disease #7 impaired fasting glucose- hgba1c pending      Code Status Orders        Start     Ordered   05/21/14 2037  Full code   Continuous     05/21/14 2036       DVT Prophylaxis   SCDs  Lab Results  Component Value Date   PLT 106* 05/22/2014     Time Spent in minutes  60min   Dustin Flock M.D on 05/22/2014 at 2:22 PM  Between 7am to 6pm - Pager - [phone]  After 6pm go to [email or web address] - password [email or web address] [facility]   Office  [phone]

## 2014-05-22 NOTE — Progress Notes (Signed)
PT Cancellation Note  Patient Details Name: [name] MRN: [id number] DOB: [birth year]   Cancelled Treatment:    Reason Eval/Treat Not Completed: Medical issues which prohibited therapy (Current potassium values are contraindication for therapy. Will attempte evaluation at later date and time when patient is appropriate)  Phillips Grout, PT  [name] 05/22/2014, 8:53 AM

## 2014-05-23 LAB — BASIC METABOLIC PANEL
ANION GAP: 6 (ref 5–15)
BUN: 5 mg/dL — ABNORMAL LOW (ref 6–20)
CHLORIDE: 103 mmol/L (ref 101–111)
CO2: 28 mmol/L (ref 22–32)
CREATININE: 0.54 mg/dL (ref 0.44–1.00)
Calcium: 7.4 mg/dL — ABNORMAL LOW (ref 8.9–10.3)
Glucose, Bld: 97 mg/dL (ref 65–99)
POTASSIUM: 2.5 mmol/L — AB (ref 3.5–5.1)
Sodium: 137 mmol/L (ref 135–145)

## 2014-05-23 LAB — CBC
HEMATOCRIT: 25.8 % — AB (ref 35.0–47.0)
HEMOGLOBIN: 8.1 g/dL — AB (ref 12.0–16.0)
MCH: 26.6 pg (ref 26.0–34.0)
MCHC: 31.3 g/dL — AB (ref 32.0–36.0)
MCV: 85 fL (ref 80.0–100.0)
Platelets: 105 10*3/uL — ABNORMAL LOW (ref 150–440)
RBC: 3.03 MIL/uL — AB (ref 3.80–5.20)
RDW: 26.3 % — ABNORMAL HIGH (ref 11.5–14.5)
WBC: 3.7 10*3/uL (ref 3.6–11.0)

## 2014-05-23 LAB — MISC LABCORP TEST (SEND OUT): Labcorp test code: 140659

## 2014-05-23 MED ORDER — POTASSIUM CHLORIDE CRYS ER 20 MEQ PO TBCR
40.0000 meq | EXTENDED_RELEASE_TABLET | ORAL | Status: AC
  Administered 2014-05-23: 40 meq via ORAL
  Filled 2014-05-23: qty 2

## 2014-05-23 MED ORDER — POTASSIUM CHLORIDE 2 MEQ/ML IV SOLN
Freq: Once | INTRAVENOUS | Status: AC
  Administered 2014-05-23: 11:00:00 via INTRAVENOUS
  Filled 2014-05-23: qty 500

## 2014-05-23 MED ORDER — POTASSIUM CHLORIDE 10 MEQ/100ML IV SOLN: 10.0000 meq | INTRAVENOUS | Status: DC

## 2014-05-23 NOTE — Progress Notes (Signed)
[place] at [facility]                                                                                                                                                                                            Patient Demographics   [name], is a 59 y.o. female, DOB - 20-Oct-1955, [id number]  Admit date - 05/21/2014   Admitting Physician Loletha Grayer, MD  Outpatient Primary MD for the patient is No primary care provider on file.    Chief Complaint  Patient presents with  . Bloated  . Abdominal Pain      Continues to complain of abdominal distention and pain. Able to tolerate diet. Awaiting ultrasound-guided paracentesis.  Review of Systems:   CONSTITUTIONAL: No documented fever. No fatigue, weakness. No weight gain, no weight loss.  EYES: No blurry or double vision.  ENT: No tinnitus. No postnasal drip. No redness of the oropharynx.  RESPIRATORY: No cough, no wheeze, no hemoptysis. No dyspnea.  CARDIOVASCULAR: No chest pain. No orthopnea. No palpitations. No syncope.  GASTROINTESTINAL: No nausea, no vomiting or diarrhea.  + abdominal pain. No melena or hematochezia.  GENITOURINARY: No dysuria or hematuria.  ENDOCRINE: No polyuria or nocturia. No heat or cold intolerance.  HEMATOLOGY: No anemia. No bruising. No bleeding.  INTEGUMENTARY: No rashes. No lesions.  MUSCULOSKELETAL: No arthritis. No swelling. No gout.  NEUROLOGIC: No numbness, tingling, or ataxia. No seizure-type activity.  PSYCHIATRIC: No anxiety. No insomnia. No ADD.    Vitals:   Filed Vitals:   05/22/14 0519 05/22/14 1515 05/22/14 2143 05/23/14 0520  BP: 106/65 102/59 113/58 106/56  Pulse: 93 87 103 90  Temp: 98.1 F (36.7 C) 98.1 F (36.7 C) 99.9 F (37.7 C) 98.1 F (36.7 C)  TempSrc: Oral Oral Oral   Resp: 16 20 16 16   Height:      Weight:      SpO2: 95% 97% 96% 95%    Wt Readings from Last 3 Encounters:  05/22/14 69.003 kg (152 lb 2 oz)      Intake/Output Summary (Last 24 hours) at 05/23/14 1147 Last data filed at 05/23/14 0900  Gross per 24 hour  Intake    570 ml  Output   1100 ml  Net   -530 ml    Physical Exam:   GENERAL: Pleasant-appearing in no apparent distress.  HEAD, EYES, EARS, NOSE AND THROAT: Atraumatic, normocephalic. Extraocular muscles are intact. Pupils equal and reactive to light. Sclerae anicteric. No conjunctival injection. No oro-pharyngeal erythema.  NECK: Supple. There is no jugular venous distention. No bruits, no lymphadenopathy, no thyromegaly.  HEART:  Regular rate and rhythm, tachycardic. No murmurs, no rubs, no clicks.  LUNGS: Clear to auscultation bilaterally. No rales or rhonchi. No wheezes.  ABDOMEN: Distened, + BS x 4, No hepatosplenomegaly, no guarding no rebound tenderness EXTREMITIES: No evidence of any cyanosis, clubbing, or peripheral edema.  +2 pedal and radial pulses bilaterally.  NEUROLOGIC: The patient is alert, awake, and oriented x3 with no focal motor or sensory deficits appreciated bilaterally.  SKIN: Moist and warm with no rashes appreciated.     Antibiotics   Anti-infectives    None      Medications   Scheduled Meds: . beta carotene w/minerals  1 tablet Oral Daily  . enoxaparin (LOVENOX) injection  40 mg Subcutaneous Q24H  . folic acid  1 mg Oral Daily  . furosemide  20 mg Intravenous BID  . magnesium oxide  400 mg Oral BID  . potassium chloride  20 mEq Oral BID  . senna  1 tablet Oral BID  . spironolactone  12.5 mg Oral Daily  . thiamine IV  100 mg Intravenous Daily   Continuous Infusions:  PRN Meds:.LORazepam, ondansetron **OR** ondansetron (ZOFRAN) IV, oxyCODONE   Data Review:   Micro Results No results found for this or any previous visit (from the past 240 hour(s)).  Radiology Reports US Abdomen Limited Ruq  05/21/2014   CLINICAL DATA:  Abdominal pain for 2 days  EXAM: US ABDOMEN LIMITED - RIGHT UPPER QUADRANT  COMPARISON:  None.  FINDINGS:  Gallbladder:  No gallstones are noted within gallbladder. Borderline thickening of gallbladder wall up to 3 mm. There is no sonographic Murphy's sign.  Common bile duct:  Diameter: 3 mm in diameter within normal limits.  Liver:  No focal hepatic mass. There is diffuse increased echogenicity of the liver. Nodular contour of the liver is noted. Chronic liver disease or early cirrhosis cannot be excluded. Clinical correlation is necessary. Perihepatic ascites is noted.  IMPRESSION: 1. No gallstones are noted within gallbladder. 2. Normal CBD.  No sonographic Murphy's sign. 3. Diffuse increased echogenicity of the liver with nodular contour. Chronic liver disease or early cirrhosis cannot be excluded. Perihepatic ascites. Clinical correlation is necessary.   Electronically Signed   By: Lahoma Crocker M.D.   On: 05/21/2014 17:12     CBC  Recent Labs Lab 05/21/14 1358 05/21/14 2109 05/22/14 0559 05/23/14 0636  WBC 5.9 3.7 3.5* 3.7  HGB 9.3* 8.7* 8.4* 8.1*  HCT 29.8* 26.7* 26.4* 25.8*  PLT 142* 111* 106* 105*  MCV 84.2 84.0 83.8 85.0  MCH 26.3 27.2 26.6 26.6  MCHC 31.2* 32.4 31.7* 31.3*  RDW 26.3* 26.6* 26.8* 26.3*  LYMPHSABS 1.2  --   --   --   MONOABS 0.7  --   --   --   EOSABS 0.1  --   --   --   BASOSABS 0.0  --   --   --     Chemistries   Recent Labs Lab 05/21/14 1358 05/21/14 1533 05/21/14 2109 05/22/14 1245 05/23/14 0636  NA 137  --  138 137 137  K 2.0*  --  2.1* 2.5* 2.5*  CL 103  --  104 103 103  CO2 23  --  25 27 28   GLUCOSE 126*  --  142* 93 97  BUN <5*  --  <5* 5* 5*  CREATININE 0.62  --  0.57 0.75 0.54  CALCIUM 7.7*  --  7.6* 8.6* 7.4*  MG  --  1.8  --  1.9  --  AST 75*  --  66*  --   --   ALT 16  --  15  --   --   ALKPHOS 374*  --  335*  --   --   BILITOT 2.3*  --  1.8*  --   --    ------------------------------------------------------------------------------------------------------------------ estimated creatinine clearance is 71.3 mL/min (by C-G formula based on  Cr of 0.54). ------------------------------------------------------------------------------------------------------------------  Recent Labs  05/22/14 0559  HGBA1C 5.5   ------------------------------------------------------------------------------------------------------------------ No results for input(s): CHOL, HDL, LDLCALC, TRIG, CHOLHDL, LDLDIRECT in the last 72 hours. ------------------------------------------------------------------------------------------------------------------ No results for input(s): TSH, T4TOTAL, T3FREE, THYROIDAB in the last 72 hours.  Invalid input(s): FREET3 ------------------------------------------------------------------------------------------------------------------ No results for input(s): VITAMINB12, FOLATE, FERRITIN, TIBC, IRON, RETICCTPCT in the last 72 hours.  Coagulation profile  Recent Labs Lab 05/22/14 0559  INR 1.36    No results for input(s): DDIMER in the last 72 hours.  Cardiac Enzymes No results for input(s): CKMB, TROPONINI, MYOGLOBIN in the last 168 hours.  Invalid input(s): CK ------------------------------------------------------------------------------------------------------------------ Invalid input(s): [place]   #1 severe hypokalemia - on oral supplements, iv KCL 60meq.   #2 massive ascites with alcohol cirrhosis parancentisis tomm am. Due to pain start abx for possible sbp #3 alcohol abuse -Oral Ciwa protocol  , no evidence of withdrawl -Must stop drinking alcohol. #4 tobacco abuse smoking cessation counseling done   #5 elevated liver function test, thrombocytopenia, poor nutritional status all secondary to alcohol cirrhosis. #6 anemia likely of chronic disease #7 impaired fasting glucose- [id number]      Code Status Orders        Start     Ordered   05/21/14 2037  Full code   Continuous     05/21/14 2036       DVT Prophylaxis   SCDs  Lab Results  Component Value Date    PLT 105* 05/23/2014     Time Spent in minutes  37min   Dustin Flock M.D on 05/23/2014 at 11:47 AM  Between 7am to 6pm - Pager - [phone]  After 6pm go to [email or web address] - password [email or web address] [facility]   Office  [phone]

## 2014-05-23 NOTE — Plan of Care (Signed)
Problem: Discharge Progression Outcomes Goal: Other Discharge Outcomes/Goals Outcome: Progressing Pain meds x1 today. Abd remains very distended. Paracentesis planned for tomorrow. Potassium remains low at 2.5. Replacement given IV and PO

## 2014-05-23 NOTE — Plan of Care (Signed)
Problem: Discharge Progression Outcomes Goal: Discharge plan in place and appropriate Individualization  Individualization of Care:   Patient likes to be called [name].  PMH: Alcoholism, Cirrhosis, and is a heavy smoker.     Goal: Other Discharge Outcomes/Goals Plan of care progress to goal:   Patient c/o abdominal pain once - PRN pain medication given, relief noted. Patient on telemetry, NSR. Up to bathroom independently. Abdomen remains distended.

## 2014-05-23 NOTE — Progress Notes (Signed)
PT Cancellation Note  Patient Details Name: [name] MRN: [id number] DOB: 10-Sep-1955   Cancelled Treatment:    Reason Eval/Treat Not Completed: Medical issues which prohibited therapy (Current potassium values are contraindication for therapy. Will attempte evaluation at later date and time when patient is appropriate)  Phillips Grout, [place] 05/23/2014, 8:21 AM

## 2014-05-24 ENCOUNTER — Inpatient Hospital Stay: Payer: Medicaid Other

## 2014-05-24 LAB — CBC
HCT: 25.1 % — ABNORMAL LOW (ref 35.0–47.0)
HEMOGLOBIN: 8 g/dL — AB (ref 12.0–16.0)
MCH: 26.8 pg (ref 26.0–34.0)
MCHC: 31.7 g/dL — AB (ref 32.0–36.0)
MCV: 84.6 fL (ref 80.0–100.0)
Platelets: 106 10*3/uL — ABNORMAL LOW (ref 150–440)
RBC: 2.97 MIL/uL — AB (ref 3.80–5.20)
RDW: 25.9 % — AB (ref 11.5–14.5)
WBC: 3.3 10*3/uL — ABNORMAL LOW (ref 3.6–11.0)

## 2014-05-24 LAB — PROTEIN, TOTAL: TOTAL PROTEIN: 5.8 g/dL — AB (ref 6.5–8.1)

## 2014-05-24 LAB — BASIC METABOLIC PANEL
Anion gap: 6 (ref 5–15)
BUN: 5 mg/dL — ABNORMAL LOW (ref 6–20)
CO2: 28 mmol/L (ref 22–32)
CREATININE: 0.64 mg/dL (ref 0.44–1.00)
Calcium: 7.3 mg/dL — ABNORMAL LOW (ref 8.9–10.3)
Chloride: 101 mmol/L (ref 101–111)
GFR calc Af Amer: >60 mL/min (ref >60)
GFR calc non Af Amer: >60 mL/min (ref >60)
Glucose, Bld: 95 mg/dL (ref 65–99)
Potassium: 2.9 mmol/L — CL (ref 3.5–5.1)
SODIUM: 135 mmol/L (ref 135–145)

## 2014-05-24 MED ORDER — SODIUM CHLORIDE 0.9 % IV SOLN
Freq: Once | INTRAVENOUS | Status: AC
  Administered 2014-05-24: 10:00:00 via INTRAVENOUS
  Filled 2014-05-24: qty 500

## 2014-05-24 MED ORDER — CIPROFLOXACIN IN D5W 400 MG/200ML IV SOLN
400.0000 mg | Freq: Two times a day (BID) | INTRAVENOUS | Status: DC
  Administered 2014-05-24 – 2014-05-25 (×2): 400 mg via INTRAVENOUS
  Filled 2014-05-24 (×5): qty 200

## 2014-05-24 MED ORDER — OCUVITE PO TABS
1.0000 | ORAL_TABLET | Freq: Every day | ORAL | Status: DC
  Filled 2014-05-24: qty 1

## 2014-05-24 MED ORDER — POTASSIUM CHLORIDE 10 MEQ/100ML IV SOLN: 10.0000 meq | INTRAVENOUS | Status: DC

## 2014-05-24 NOTE — Plan of Care (Signed)
Problem: Discharge Progression Outcomes Goal: Discharge plan in place and appropriate Individualization  Pt prefers to be called [name] Moderate fall risk-offer toileting on safety rounds Hx of Cirrhosis, alcoholism and chronic smoker Goal: Other Discharge Outcomes/Goals Plan of care progress to goal:  Pain: pain med given x1 with improvement.  Diet: npo for US guided paracentesis today

## 2014-05-24 NOTE — Plan of Care (Signed)
Problem: Discharge Progression Outcomes Goal: Other Discharge Outcomes/Goals Outcome: Progressing Paracentesis performed today. 7.6L removed.  Pain improved. Pain meds x1 today NPO this morning Poor appetite for lunch

## 2014-05-24 NOTE — Progress Notes (Signed)
[place] at [facility]                                                                                                                                                                                            Patient Demographics   [name], is a 59 y.o. female, DOB - 1955/07/30, WKM:[id number]  Admit date - 05/21/2014   Admitting Physician Loletha Grayer, MD  Outpatient Primary MD for the patient is No primary care provider on file.    Chief Complaint  Patient presents with  . Bloated  . Abdominal Pain      Had paracentits earlier, pain improved   Review of Systems:   CONSTITUTIONAL: No documented fever. No fatigue, weakness. No weight gain, no weight loss.  EYES: No blurry or double vision.  ENT: No tinnitus. No postnasal drip. No redness of the oropharynx.  RESPIRATORY: No cough, no wheeze, no hemoptysis. No dyspnea.  CARDIOVASCULAR: No chest pain. No orthopnea. No palpitations. No syncope.  GASTROINTESTINAL: No nausea, no vomiting or diarrhea.  + abdominal pain. No melena or hematochezia.  GENITOURINARY: No dysuria or hematuria.  ENDOCRINE: No polyuria or nocturia. No heat or cold intolerance.  HEMATOLOGY: No anemia. No bruising. No bleeding.  INTEGUMENTARY: No rashes. No lesions.  MUSCULOSKELETAL: No arthritis. No swelling. No gout.  NEUROLOGIC: No numbness, tingling, or ataxia. No seizure-type activity.  PSYCHIATRIC: No anxiety. No insomnia. No ADD.    Vitals:   Filed Vitals:   05/24/14 1155 05/24/14 1200 05/24/14 1203 05/24/14 1247  BP: 83/51 87/53 87/49  91/60  Pulse: 84 85 86 85  Temp:    98.5 F (36.9 C)  TempSrc:    Oral  Resp: 18 18 18 16   Height:      Weight:      SpO2: 94% 94% 96% 93%    Wt Readings from Last 3 Encounters:  05/24/14 64.819 kg (142 lb 14.4 oz)     Intake/Output Summary (Last 24 hours) at 05/24/14 1320 Last data filed at 05/23/14 1700  Gross per 24 hour  Intake    120 ml  Output      0 ml   Net    120 ml    Physical Exam:   GENERAL: Pleasant-appearing in no apparent distress.  HEAD, EYES, EARS, NOSE AND THROAT: Atraumatic, normocephalic. Extraocular muscles are intact. Pupils equal and reactive to light. Sclerae anicteric. No conjunctival injection. No oro-pharyngeal erythema.  NECK: Supple. There is no jugular venous distention. No bruits, no lymphadenopathy, no thyromegaly.  HEART: Regular rate and rhythm, tachycardic. No murmurs, no rubs, no clicks.  LUNGS: Clear  to auscultation bilaterally. No rales or rhonchi. No wheezes.  ABDOMEN: Distened, + BS x 4, No hepatosplenomegaly, no guarding no rebound tenderness EXTREMITIES: No evidence of any cyanosis, clubbing, or peripheral edema.  +2 pedal and radial pulses bilaterally.  NEUROLOGIC: The patient is alert, awake, and oriented x3 with no focal motor or sensory deficits appreciated bilaterally.  SKIN: Moist and warm with no rashes appreciated.     Antibiotics   Anti-infectives    Start     Dose/Rate Route Frequency Ordered Stop   05/24/14 1315  ciprofloxacin (CIPRO) IVPB 400 mg     400 mg 200 mL/hr over 60 Minutes Intravenous Every 12 hours 05/24/14 1310        Medications   Scheduled Meds: . beta carotene w/minerals  1 tablet Oral Daily  . ciprofloxacin  400 mg Intravenous Q12H  . folic acid  1 mg Oral Daily  . furosemide  20 mg Intravenous BID  . magnesium oxide  400 mg Oral BID  . senna  1 tablet Oral BID  . spironolactone  12.5 mg Oral Daily  . thiamine IV  100 mg Intravenous Daily   Continuous Infusions:  PRN Meds:.LORazepam, ondansetron **OR** ondansetron (ZOFRAN) IV, oxyCODONE   Data Review:   Micro Results No results found for this or any previous visit (from the past 240 hour(s)).  Radiology Reports US Paracentesis  05/24/2014   CLINICAL DATA:  Abdominal distention.  Ascites.  EXAM: ULTRASOUND GUIDED PARACENTESIS  COMPARISON:  None.  PROCEDURE: An ultrasound guided paracentesis was thoroughly  discussed with the patient and questions answered. The benefits, risks, alternatives and complications were also discussed. The patient understands and wishes to proceed with the procedure. Written consent was obtained.  Ultrasound was performed to localize and mark an adequate pocket of fluid in the right lower quadrant of the abdomen. The area was then prepped and draped in the normal sterile fashion. 1% Lidocaine was used for local anesthesia. Under ultrasound guidance a paracentesis catheter was introduced. Paracentesis was performed. The catheter was removed and a dressing applied.  COMPLICATIONS: None immediate.  FINDINGS: A total of approximately 7.8 L of serous fluid was removed. A fluid sample was sent for laboratory analysis.  IMPRESSION: Successful ultrasound guided paracentesis yielding 7.8 L of ascites.   Electronically Signed   By: Marijo Conception, M.D.   On: 05/24/2014 12:40   US Abdomen Limited Ruq  05/21/2014   CLINICAL DATA:  Abdominal pain for 2 days  EXAM: US ABDOMEN LIMITED - RIGHT UPPER QUADRANT  COMPARISON:  None.  FINDINGS: Gallbladder:  No gallstones are noted within gallbladder. Borderline thickening of gallbladder wall up to 3 mm. There is no sonographic Murphy's sign.  Common bile duct:  Diameter: 3 mm in diameter within normal limits.  Liver:  No focal hepatic mass. There is diffuse increased echogenicity of the liver. Nodular contour of the liver is noted. Chronic liver disease or early cirrhosis cannot be excluded. Clinical correlation is necessary. Perihepatic ascites is noted.  IMPRESSION: 1. No gallstones are noted within gallbladder. 2. Normal CBD.  No sonographic Murphy's sign. 3. Diffuse increased echogenicity of the liver with nodular contour. Chronic liver disease or early cirrhosis cannot be excluded. Perihepatic ascites. Clinical correlation is necessary.   Electronically Signed   By: Lahoma Crocker M.D.   On: 05/21/2014 17:12     CBC  Recent Labs Lab 05/21/14 1358  05/21/14 2109 05/22/14 0559 05/23/14 0636 05/24/14 0520  WBC 5.9 3.7 3.5* 3.7 3.3*  HGB 9.3* 8.7* 8.4* 8.1* 8.0*  HCT 29.8* 26.7* 26.4* 25.8* 25.1*  PLT 142* 111* 106* 105* 106*  MCV 84.2 84.0 83.8 85.0 84.6  MCH 26.3 27.2 26.6 26.6 26.8  MCHC 31.2* 32.4 31.7* 31.3* 31.7*  RDW 26.3* 26.6* 26.8* 26.3* 25.9*  LYMPHSABS 1.2  --   --   --   --   MONOABS 0.7  --   --   --   --   EOSABS 0.1  --   --   --   --   BASOSABS 0.0  --   --   --   --     Chemistries   Recent Labs Lab 05/21/14 1358 05/21/14 1533 05/21/14 2109 05/22/14 1245 05/23/14 0636 05/24/14 0520  NA 137  --  138 137 137 135  K 2.0*  --  2.1* 2.5* 2.5* 2.9*  CL 103  --  104 103 103 101  CO2 23  --  25 27 28 28   GLUCOSE 126*  --  142* 93 97 95  BUN <5*  --  <5* 5* 5* 5*  CREATININE 0.62  --  0.57 0.75 0.54 0.64  CALCIUM 7.7*  --  7.6* 8.6* 7.4* 7.3*  MG  --  1.8  --  1.9  --   --   AST 75*  --  66*  --   --   --   ALT 16  --  15  --   --   --   ALKPHOS 374*  --  335*  --   --   --   BILITOT 2.3*  --  1.8*  --   --   --    ------------------------------------------------------------------------------------------------------------------ estimated creatinine clearance is 64.6 mL/min (by C-G formula based on Cr of 0.64). ------------------------------------------------------------------------------------------------------------------  Recent Labs  05/22/14 0559  HGBA1C 5.5   ------------------------------------------------------------------------------------------------------------------ No results for input(s): CHOL, HDL, LDLCALC, TRIG, CHOLHDL, LDLDIRECT in the last 72 hours. ------------------------------------------------------------------------------------------------------------------ No results for input(s): TSH, T4TOTAL, T3FREE, THYROIDAB in the last 72 hours.  Invalid input(s):  FREET3 ------------------------------------------------------------------------------------------------------------------ No results for input(s): VITAMINB12, FOLATE, FERRITIN, TIBC, IRON, RETICCTPCT in the last 72 hours.  Coagulation profile  Recent Labs Lab 05/22/14 0559  INR 1.36    No results for input(s): DDIMER in the last 72 hours.  Cardiac Enzymes No results for input(s): CKMB, TROPONINI, MYOGLOBIN in the last 168 hours.  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 #4 tobacco abuse smoking cessation counseling done   #5 elevated liver function test, thrombocytopenia, poor nutritional status all secondary to alcohol cirrhosis. #6 anemia likely of chronic disease #7 impaired fasting glucose- [id number]  Discharge tomm     Code Status Orders        Start     Ordered   05/21/14 2037  Full code   Continuous     05/21/14 2036       DVT Prophylaxis   SCDs  Lab Results  Component Value Date   PLT 106* 05/24/2014     Time Spent in minutes 63min   Dustin Flock M.D on 05/24/2014 at 1:20 PM  Between 7am to 6pm - Pager - [phone]  After 6pm go to [email or web address] - [place]  [facility]   Office  [phone]

## 2014-05-24 NOTE — Progress Notes (Signed)
PT Cancellation Note  Patient Details Name: [name] MRN: [id number] DOB: 02/15/1955   Cancelled Treatment:    Reason Eval/Treat Not Completed: Medical issues which prohibited therapy (Current Potassium lab value is a contraindication for therapy.  Will hold PT eval until pt is medically appropriate to participate in physical therapy.)   Leitha Bleak 05/24/2014, 8:28 AM Leitha Bleak, [place]

## 2014-05-25 LAB — BASIC METABOLIC PANEL
ANION GAP: 5 (ref 5–15)
BUN: <5 mg/dL — ABNORMAL LOW (ref 6–20)
CO2: 28 mmol/L (ref 22–32)
Calcium: 7.3 mg/dL — ABNORMAL LOW (ref 8.9–10.3)
Chloride: 102 mmol/L (ref 101–111)
Creatinine, Ser: 0.6 mg/dL (ref 0.44–1.00)
GFR calc Af Amer: >60 mL/min (ref >60)
GFR calc non Af Amer: >60 mL/min (ref >60)
GLUCOSE: 107 mg/dL — AB (ref 65–99)
POTASSIUM: 3.2 mmol/L — AB (ref 3.5–5.1)
SODIUM: 135 mmol/L (ref 135–145)

## 2014-05-25 LAB — BODY FLUID CELL COUNT WITH DIFFERENTIAL
EOS FL: 0 %
Lymphs, Fluid: 56 %
MONOCYTE-MACROPHAGE-SEROUS FLUID: 44 %
Neutrophil Count, Fluid: 0 %
OTHER CELLS FL: 0 %
Total Nucleated Cell Count, Fluid: 30 cu mm

## 2014-05-25 LAB — PROTEIN, BODY FLUID: Total protein, fluid: <3 g/dL

## 2014-05-25 LAB — C DIFFICILE QUICK SCREEN W PCR REFLEX
C DIFFICILE (CDIFF) INTERP: NEGATIVE
C Diff antigen: NEGATIVE
C Diff toxin: NEGATIVE

## 2014-05-25 MED ORDER — SPIRONOLACTONE 50 MG PO TABS: 50.0000 mg | ORAL_TABLET | Freq: Every day | ORAL | Status: DC

## 2014-05-25 MED ORDER — FUROSEMIDE 20 MG PO TABS: 20.0000 mg | ORAL_TABLET | Freq: Two times a day (BID) | ORAL | Status: AC

## 2014-05-25 MED ORDER — CIPROFLOXACIN HCL 500 MG PO TABS: 500.0000 mg | ORAL_TABLET | Freq: Two times a day (BID) | ORAL | Status: AC

## 2014-05-25 MED ORDER — POTASSIUM CHLORIDE CRYS ER 20 MEQ PO TBCR
40.0000 meq | EXTENDED_RELEASE_TABLET | Freq: Once | ORAL | Status: AC
  Administered 2014-05-25: 40 meq via ORAL
  Filled 2014-05-25: qty 2

## 2014-05-25 MED ORDER — THERA VITAL M PO TABS: 1.0000 | ORAL_TABLET | Freq: Every day | ORAL | Status: DC

## 2014-05-25 MED ORDER — POTASSIUM CHLORIDE CRYS ER 20 MEQ PO TBCR: 40.0000 meq | EXTENDED_RELEASE_TABLET | Freq: Once | ORAL | Status: DC

## 2014-05-25 MED ORDER — POTASSIUM CHLORIDE ER 10 MEQ PO TBCR: 10.0000 meq | EXTENDED_RELEASE_TABLET | Freq: Every day | ORAL | Status: DC

## 2014-05-25 MED ORDER — OXYCODONE-ACETAMINOPHEN 5-325 MG PO TABS: 1.0000 | ORAL_TABLET | Freq: Three times a day (TID) | ORAL | Status: DC | PRN

## 2014-05-25 MED ORDER — FUROSEMIDE 20 MG PO TABS: 20.0000 mg | ORAL_TABLET | Freq: Two times a day (BID) | ORAL | Status: DC

## 2014-05-25 NOTE — Evaluation (Signed)
Physical Therapy Evaluation Patient Details Name: [name] MRN: [id number] DOB: 04/20/1955 Today's Date: 05/25/2014   History of Present Illness  Pt is a 59 y.o. female admitted with gradual onset abdominal bloating, severe hypokalemia, and massive ascites with alcohol cirrhosis.  Pt s/p paracentesis 05/24/14 removing 7.8 L.  Clinical Impression  Pt demonstrates steady independent functional mobility without an AD and scored 28/28 on the Tinetti balance assessment (indicating pt is at low risk for falls).  No further PT indicated at this time in hospital or upon discharge.  Will complete current PT orders.    Follow Up Recommendations No PT follow up    Equipment Recommendations       Recommendations for Other Services       Precautions / Restrictions Precautions Precautions: Fall Restrictions Weight Bearing Restrictions: No      Mobility  Bed Mobility Overal bed mobility: Independent                Transfers Overall transfer level: Independent Equipment used: None                Ambulation/Gait Ambulation/Gait assistance: Independent Ambulation Distance (Feet): 200 Feet Assistive device: None Gait Pattern/deviations: WFL(Within Functional Limits)        Stairs            Wheelchair Mobility    Modified Rankin (Stroke Patients Only)       Balance Overall balance assessment: Independent                               Standardized Balance Assessment Standardized Balance Assessment :  (Tinetti balance assessment: 28/28 (indicates low risk for falls))           Pertinent Vitals/Pain Pain Assessment: 0-10 Pain Score: 4  Pain Location: 4/10 back; 1/10 abdominal Pain Intervention(s): Limited activity within patient's tolerance;Monitored during session           Vitals:  At rest HR 96 bpm and O2 98% on room air; after activity HR 98 bpm and O2 98% on room air  Home Living Family/patient expects to be discharged to:: Private  residence Living Arrangements: Alone (with her cat)     Home Access: Level entry     Home Layout: One level Home Equipment: Walker - 2 wheels      Prior Function Level of Independence: Independent         Comments: occasionally uses RW when her back hurts (tripped on her cat and fell in January and with increased back pain).     Hand Dominance        Extremity/Trunk Assessment   Upper Extremity Assessment: Overall WFL for tasks assessed           Lower Extremity Assessment: Overall WFL for tasks assessed         Communication   Communication: No difficulties  Cognition Arousal/Alertness: Awake/alert Behavior During Therapy: WFL for tasks assessed/performed Overall Cognitive Status: Within Functional Limits for tasks assessed                      General Comments      Exercises        Assessment/Plan    PT Assessment Patent does not need any further PT services  PT Diagnosis     PT Problem List    PT Treatment Interventions     PT Goals (Current goals can be found in the Care  Plan section) Acute Rehab PT Goals Patient Stated Goal: To go home PT Goal Formulation: With patient Time For Goal Achievement: 06/22/14 Potential to Achieve Goals: Good    Frequency     Barriers to discharge        Co-evaluation               End of Session Equipment Utilized During Treatment: Gait belt Activity Tolerance: Patient tolerated treatment well Patient left: in bed           Time: 3734-2876 PT Time Calculation (min) (ACUTE ONLY): 14 min   Charges:   PT Evaluation $Initial PT Evaluation Tier I: 1 Procedure     PT G CodesLeitha Bleak 2014-06-22, [place], [place]

## 2014-05-25 NOTE — Plan of Care (Signed)
Problem: Discharge Progression Outcomes Goal: Tolerating diet Outcome: Completed/Met Date Met:  05/25/14 Oxycodone 57m po given for pain. Noted improvement. Labs with noted improvement. Tolerating diet. No s/s of N/V.

## 2014-05-25 NOTE — Discharge Summary (Signed)
[name], 59 y.o., DOB Aug 11, 1955, MRN [id number]. Admission date: 05/21/2014 Discharge Date 05/25/2014 Primary MD No primary care provider on file. Admitting Physician Loletha Grayer, MD  Admission Diagnosis  Hypokalemia [E87.6] Swelling abdomen [R19.00] Ascites due to alcoholic cirrhosis [O24.23]  Discharge Diagnosis   Active Problems:   Hypokalemia Ascites  Past Medical History  Diagnosis Date  . Cirrhosis of liver     Past Surgical History  Procedure Laterality Date  . Tonsillectomy    . Appendectomy        Hospital Course See H&P, Labs, Consult and Test reports for all details in brief, patient was admitted for  Abdominal pain with distention. Pt was also noted to have severe hypokalemia. Pt K was slowly replaced. She underwent paracentits with improvement in her sx. She had close to 7l fluid removal.  I strongly recommended she stop drinking.She will need f/u with gi as out patient.  She is started on lasix as well as spironolactone.  Fluid anyalsis from ascites was c/w transudative fluid.   Consults  None  Significant Tests:  See full reports for all details    US Paracentesis  05/24/2014   CLINICAL DATA:  Abdominal distention.  Ascites.  EXAM: ULTRASOUND GUIDED PARACENTESIS  COMPARISON:  None.  PROCEDURE: An ultrasound guided paracentesis was thoroughly discussed with the patient and questions answered. The benefits, risks, alternatives and complications were also discussed. The patient understands and wishes to proceed with the procedure. Written consent was obtained.  Ultrasound was performed to localize and mark an adequate pocket of fluid in the right lower quadrant of the abdomen. The area was then prepped and draped in the normal sterile fashion. 1% Lidocaine was used for local anesthesia. Under ultrasound guidance a paracentesis catheter was introduced. Paracentesis was performed. The catheter was removed and a dressing applied.  COMPLICATIONS: None immediate.   FINDINGS: A total of approximately 7.8 L of serous fluid was removed. A fluid sample was sent for laboratory analysis.  IMPRESSION: Successful ultrasound guided paracentesis yielding 7.8 L of ascites.   Electronically Signed   By: Marijo Conception, M.D.   On: 05/24/2014 12:40   US Abdomen Limited Ruq  05/21/2014   CLINICAL DATA:  Abdominal pain for 2 days  EXAM: US ABDOMEN LIMITED - RIGHT UPPER QUADRANT  COMPARISON:  None.  FINDINGS: Gallbladder:  No gallstones are noted within gallbladder. Borderline thickening of gallbladder wall up to 3 mm. There is no sonographic Murphy's sign.  Common bile duct:  Diameter: 3 mm in diameter within normal limits.  Liver:  No focal hepatic mass. There is diffuse increased echogenicity of the liver. Nodular contour of the liver is noted. Chronic liver disease or early cirrhosis cannot be excluded. Clinical correlation is necessary. Perihepatic ascites is noted.  IMPRESSION: 1. No gallstones are noted within gallbladder. 2. Normal CBD.  No sonographic Murphy's sign. 3. Diffuse increased echogenicity of the liver with nodular contour. Chronic liver disease or early cirrhosis cannot be excluded. Perihepatic ascites. Clinical correlation is necessary.   Electronically Signed   By: Lahoma Crocker M.D.   On: 05/21/2014 17:12       Today   Subjective:   [name] today has no headache,no chest abdominal pain,no new weakness tingling or numbness, feels much better wants to go home today.  Abdominal pain much improved, no nausea or vomiting.   Objective:   Blood pressure 92/50, pulse 80, temperature 98.6 F (37 C), temperature source Oral, resp. rate 18, height 5\' 4"  (  1.626 m), weight 57.652 kg (127 lb 1.6 oz), SpO2 97 %.  .  Intake/Output Summary (Last 24 hours) at 05/25/14 1308 Last data filed at 05/25/14 0952  Gross per 24 hour  Intake    120 ml  Output      0 ml  Net    120 ml    Exam Awake Alert, Oriented *3, No new F.N deficits, Normal  affect [place].AT,PERRAL Supple Neck,No JVD, No cervical lymphadenopathy appreciated.  Symmetrical Chest wall movement, Good air movement bilaterally, CTAB RRR,No Gallops,Rubs or new Murmurs, No Parasternal Heave +ve B.Sounds, Abd Soft, Non tender, No organomegaly appriciated, No rebound -guarding or rigidity. No Cyanosis, Clubbing or edema, No new Rash or bruise  Data Review   Cultures -  CBC w Diff: Lab Results  Component Value Date   WBC 3.3* 05/24/2014   WBC 4.8 01/25/2014   HGB 8.0* 05/24/2014   HGB 8.7* 01/25/2014   HCT 25.1* 05/24/2014   HCT 28.6* 01/25/2014   PLT 106* 05/24/2014   PLT 105* 01/25/2014   LYMPHOPCT 20 05/21/2014   LYMPHOPCT 16.6 01/25/2014   MONOPCT 12 05/21/2014   MONOPCT 6.6 01/25/2014   EOSPCT 2 05/21/2014   EOSPCT 2.6 01/25/2014   BASOPCT 1 05/21/2014   BASOPCT 0.5 01/25/2014   CMP: Lab Results  Component Value Date   NA 135 05/25/2014   NA 145 01/26/2014   K 3.2* 05/25/2014   K 4.2 01/26/2014   CL 102 05/25/2014   CL 116* 01/26/2014   CO2 28 05/25/2014   CO2 25 01/26/2014   BUN <5* 05/25/2014   BUN 4* 01/26/2014   CREATININE 0.60 05/25/2014   CREATININE 0.68 01/26/2014   PROT 5.8* 05/24/2014   PROT 5.9* 01/26/2014   ALBUMIN 2.0* 05/21/2014   ALBUMIN 2.3* 01/26/2014   BILITOT 1.8* 05/21/2014   ALKPHOS 335* 05/21/2014   ALKPHOS 252* 01/26/2014   AST 66* 05/21/2014   AST 207* 01/26/2014   ALT 15 05/21/2014   ALT 54 01/26/2014  .  Micro Results Recent Results (from the past 240 hour(s))  C difficile quick scan w PCR reflex [facility])     Status: None   Collection Time: 05/24/14 10:35 PM  Result Value Ref Range Status   C Diff antigen NEGATIVE  Final   C Diff toxin NEGATIVE  Final   C Diff interpretation Negative for C. difficile  Final     Discharge Instructions      Follow-up Information    Follow up with pcp In 7 days.   Contact information:   case manager to provide name of primar md      Follow up with [facility], MD In 2  weeks.   Specialty:  Gastroenterology   Why:  liver cirrohsis and ascites   Contact information:   [place] [address] [phone]       Discharge Medications     Medication List    STOP taking these medications        naproxen sodium 220 MG tablet  Commonly known as:  ANAPROX      TAKE these medications        ciprofloxacin 500 MG tablet  Commonly known as:  CIPRO  Take 1 tablet (500 mg total) by mouth 2 (two) times daily.     furosemide 20 MG tablet  Commonly known as:  LASIX  Take 1 tablet (20 mg total) by mouth 2 (two) times daily.     multivitamin tablet  Take 1  tablet by mouth daily.     oxyCODONE-acetaminophen 5-325 MG per tablet  Commonly known as:  ROXICET  Take 1 tablet by mouth every 8 (eight) hours as needed for severe pain.     potassium chloride 10 MEQ tablet  Commonly known as:  K-DUR  Take 1 tablet (10 mEq total) by mouth daily.     spironolactone 50 MG tablet  Commonly known as:  ALDACTONE  Take 1 tablet (50 mg total) by mouth daily.         Total Time in preparing paper work, data evaluation and todays exam - 35 minutes  Dustin Flock M.D on 05/25/2014 at 1:08 PM  [facility]   Office  [phone]

## 2014-05-25 NOTE — Discharge Instructions (Signed)
°  DIET:  Low Na diet   DISCHARGE CONDITION:  Stable  ACTIVITY:  Activity as tolerated  OXYGEN:  Home Oxygen: No.   Oxygen Delivery: room air  DISCHARGE LOCATION:  home   If you experience worsening of your admission symptoms, develop shortness of breath, life threatening emergency, suicidal or homicidal thoughts you must seek medical attention immediately by calling 911 or calling your MD immediately  if symptoms less severe.  You Must read complete instructions/literature along with all the possible adverse reactions/side effects for all the Medicines you take and that have been prescribed to you. Take any new Medicines after you have completely understood and accpet all the possible adverse reactions/side effects.   Please note  You were cared for by a hospitalist during your hospital stay. If you have any questions about your discharge medications or the care you received while you were in the hospital after you are discharged, you can call the unit and asked to speak with the hospitalist on call if the hospitalist that took care of you is not available. Once you are discharged, your primary care physician will handle any further medical issues. Please note that NO REFILLS for any discharge medications will be authorized once you are discharged, as it is imperative that you return to your primary care physician (or establish a relationship with a primary care physician if you do not have one) for your aftercare needs so that they can reassess your need for medications and monitor your lab values.

## 2014-05-25 NOTE — Care Management (Signed)
Admitted to [facility] with the diagnosis of hypokalemia. Medicaid is listed as insurance.  According to [facility] insurance card, Ms Pyle will be going to the [facility] for follow-up.  States she has transportation home. Shelbie Ammons RN MSN Care Management [phone]

## 2014-05-25 NOTE — Plan of Care (Signed)
Problem: Discharge Progression Outcomes Goal: Discharge plan in place and appropriate Individualization  Outcome: Progressing Individualization   Pt prefers to be called [name] Moderate fall risk-offer toileting on safety rounds Hx of Cirrhosis, alcoholism and chronic smoker Goal: Other Discharge Outcomes/Goals Outcome: Progressing Plan of care progress to goal Pain control: Pt received Oxy IR for c/o abdominal pain with improvement Hemodynamically stable: Paracentesis yesterday. 7.8 liters removed. Diarrhea. Pt did not report diarrhea until after Senna was given. Pt checked for Cdiff which was negative. Complications resolved: Pain and diarrhea improving. Tolerating diet: fair appetite.  Activity appropriate for discharge: Pt requiring minimal assist.

## 2014-05-28 LAB — CYTOLOGY - NON PAP

## 2014-05-28 LAB — PATHOLOGIST SMEAR REVIEW

## 2014-05-29 LAB — GRAM STAIN

## 2014-05-29 LAB — BODY FLUID CULTURE: CULTURE: NO GROWTH

## 2014-05-29 LAB — ANAEROBIC CULTURE

## 2014-07-07 ENCOUNTER — Emergency Department
Admission: EM | Admit: 2014-07-07 | Discharge: 2014-07-07 | Disposition: A | Discharge status: Home / Self Care | Payer: Medicaid Other | Attending: Emergency Medicine | Admitting: Emergency Medicine

## 2014-07-07 ENCOUNTER — Telehealth: Payer: Self-pay | Admitting: Urgent Care

## 2014-07-07 ENCOUNTER — Encounter: Payer: Self-pay | Admitting: Emergency Medicine

## 2014-07-07 DIAGNOSIS — Z880 Allergy status to penicillin: Secondary | ICD-10-CM | POA: Insufficient documentation

## 2014-07-07 DIAGNOSIS — Z720 Tobacco use: Secondary | ICD-10-CM | POA: Insufficient documentation

## 2014-07-07 DIAGNOSIS — K7031 Alcoholic cirrhosis of liver with ascites: Secondary | ICD-10-CM

## 2014-07-07 DIAGNOSIS — Z79899 Other long term (current) drug therapy: Secondary | ICD-10-CM | POA: Diagnosis not present

## 2014-07-07 DIAGNOSIS — E876 Hypokalemia: Secondary | ICD-10-CM | POA: Diagnosis not present

## 2014-07-07 DIAGNOSIS — R140 Abdominal distension (gaseous): Secondary | ICD-10-CM | POA: Diagnosis present

## 2014-07-07 LAB — CBC WITH DIFFERENTIAL/PLATELET
BASOS ABS: 0 10*3/uL (ref 0–0.1)
Basophils Relative: 1 %
Eosinophils Absolute: 0.1 10*3/uL (ref 0–0.7)
Eosinophils Relative: 2 %
HCT: 25.9 % — ABNORMAL LOW (ref 35.0–47.0)
HEMOGLOBIN: 8.2 g/dL — AB (ref 12.0–16.0)
LYMPHS PCT: 17 %
Lymphs Abs: 0.8 10*3/uL — ABNORMAL LOW (ref 1.0–3.6)
MCH: 27 pg (ref 26.0–34.0)
MCHC: 31.5 g/dL — ABNORMAL LOW (ref 32.0–36.0)
MCV: 85.6 fL (ref 80.0–100.0)
MONO ABS: 0.4 10*3/uL (ref 0.2–0.9)
MONOS PCT: 9 %
NEUTROS ABS: 3.5 10*3/uL (ref 1.4–6.5)
Neutrophils Relative %: 71 %
Platelets: 146 10*3/uL — ABNORMAL LOW (ref 150–440)
RBC: 3.03 MIL/uL — ABNORMAL LOW (ref 3.80–5.20)
RDW: 19.5 % — ABNORMAL HIGH (ref 11.5–14.5)
WBC: 4.9 10*3/uL (ref 3.6–11.0)

## 2014-07-07 LAB — MAGNESIUM: Magnesium: 1.8 mg/dL (ref 1.7–2.4)

## 2014-07-07 LAB — COMPREHENSIVE METABOLIC PANEL
ALT: 13 U/L — ABNORMAL LOW (ref 14–54)
AST: 60 U/L — ABNORMAL HIGH (ref 15–41)
Albumin: 2.2 g/dL — ABNORMAL LOW (ref 3.5–5.0)
Alkaline Phosphatase: 175 U/L — ABNORMAL HIGH (ref 38–126)
Anion gap: 8 (ref 5–15)
BILIRUBIN TOTAL: 1.1 mg/dL (ref 0.3–1.2)
BUN: 10 mg/dL (ref 6–20)
CALCIUM: 7.9 mg/dL — AB (ref 8.9–10.3)
CHLORIDE: 97 mmol/L — AB (ref 101–111)
CO2: 29 mmol/L (ref 22–32)
CREATININE: 0.82 mg/dL (ref 0.44–1.00)
GFR calc non Af Amer: >60 mL/min (ref >60)
GLUCOSE: 125 mg/dL — AB (ref 65–99)
Potassium: 2.8 mmol/L — CL (ref 3.5–5.1)
Sodium: 134 mmol/L — ABNORMAL LOW (ref 135–145)
Total Protein: 6.9 g/dL (ref 6.5–8.1)

## 2014-07-07 LAB — PROTIME-INR
INR: 1.16
Prothrombin Time: 15 seconds (ref 11.4–15.0)

## 2014-07-07 MED ORDER — POTASSIUM CHLORIDE 10 MEQ/100ML IV SOLN
10.0000 meq | Freq: Once | INTRAVENOUS | Status: AC
  Administered 2014-07-07: 10 meq via INTRAVENOUS
  Filled 2014-07-07: qty 100

## 2014-07-07 MED ORDER — POTASSIUM CHLORIDE CRYS ER 20 MEQ PO TBCR: 40.0000 meq | EXTENDED_RELEASE_TABLET | Freq: Once | ORAL | Status: DC

## 2014-07-07 MED ORDER — POTASSIUM CHLORIDE 20 MEQ PO PACK
PACK | ORAL | Status: AC
Start: 2014-07-07 — End: 2014-07-07
  Administered 2014-07-07: 40 meq via ORAL
  Filled 2014-07-07: qty 2

## 2014-07-07 MED ORDER — POTASSIUM CHLORIDE 20 MEQ PO PACK
40.0000 meq | PACK | Freq: Once | ORAL | Status: AC
  Administered 2014-07-07: 40 meq via ORAL

## 2014-07-07 MED ORDER — OXYCODONE HCL 5 MG PO TABS
5.0000 mg | ORAL_TABLET | Freq: Four times a day (QID) | ORAL | Status: DC | PRN
  Administered 2014-07-07: 5 mg via ORAL

## 2014-07-07 MED ORDER — OXYCODONE HCL 5 MG PO TABS: 5.0000 mg | ORAL_TABLET | Freq: Four times a day (QID) | ORAL | Status: DC | PRN

## 2014-07-07 MED ORDER — OXYCODONE HCL 5 MG PO TABS
ORAL_TABLET | ORAL | Status: AC
  Administered 2014-07-07: 5 mg via ORAL
  Filled 2014-07-07: qty 1

## 2014-07-07 NOTE — ED Provider Notes (Signed)
[facility] Emergency Department Provider Note   ____________________________________________  Time seen: On arrival to room I have reviewed the triage vital signs and the triage nursing note.  HISTORY  Chief Complaint Bloated   Historian Patient  HPI [name] is a 59 y.o. female who has a history of liver failure with severe ascites. She was admitted in May and had 7 L taken off. She saw a new primary care physician a few days ago and has an appointment with Dr. wall, GI, and another week. She has had increased abdominal swelling and resultant pain due to swelling over the past several weeks. It's worse when she is lying flat. She feels somewhat short of breath, but there is no chest pain. The abdominal pain is located at the edges or upper chest where is very swollen. There is been no fevers. There've been no vomiting or diarrhea.    Past Medical History  Diagnosis Date  . Cirrhosis of liver     Patient Active Problem List   Diagnosis Date Noted  . Hypokalemia 05/21/2014    Past Surgical History  Procedure Laterality Date  . Tonsillectomy    . Appendectomy      Current Outpatient Rx  Name  Route  Sig  Dispense  Refill  . furosemide (LASIX) 20 MG tablet   Oral   Take 1 tablet (20 mg total) by mouth 2 (two) times daily.   60 tablet   0     Allergies Penicillins  Family History  Problem Relation Age of Onset  . Hypertension Mother   . Dementia Father     Social History History  Substance Use Topics  . Smoking status: Current Every Day Smoker -- 0.50 packs/day    Types: Cigarettes  . Smokeless tobacco: Never Used  . Alcohol Use: 12.6 oz/week    21 Shots of liquor per week    Review of Systems  Constitutional: Negative for fever. Eyes: Negative for visual changes. ENT: Negative for sore throat. Cardiovascular: Negative for chest pain. Respiratory: No cough Gastrointestinal: Negative for vomiting and  diarrhea. Genitourinary: Negative for dysuria. Musculoskeletal: Positive for back pain. Skin: Negative for rash. Neurological: Negative for headaches, focal weakness or numbness. 10 point Review of Systems otherwise negative ____________________________________________   PHYSICAL EXAM:  VITAL SIGNS: ED Triage Vitals  Enc Vitals Group     BP 07/07/14 0809 117/58 mmHg     Pulse Rate 07/07/14 0809 109     Resp 07/07/14 0809 14     Temp 07/07/14 0809 98 F (36.7 C)     Temp Source 07/07/14 0809 Oral     SpO2 07/07/14 0809 98 %     Weight 07/07/14 0809 140 lb (63.504 kg)     Height 07/07/14 0809 5\' 4"  (1.626 m)     Head Cir --      Peak Flow --      Pain Score 07/07/14 0809 9     Pain Loc --      Pain Edu? --      Excl. in [place]? --      Constitutional: Alert and oriented. Well appearing and in no distress. Eyes: Conjunctivae are normal. PERRL. Normal extraocular movements. ENT   Head: Normocephalic and atraumatic.   Nose: No congestion/rhinnorhea.   Mouth/Throat: Mucous membranes are moist.   Neck: No stridor. Cardiovascular/Chest: Normal rate, regular rhythm.  No murmurs, rubs, or gallops. Respiratory: Normal respiratory effort without tachypnea nor retractions. Breath sounds are clear and  equal bilaterally. No wheezes/rales/rhonchi. Gastrointestinal: Soft. Mild tenderness in the epigastrium at the edge of the swelling of the abdomen into the chest. Nontender in the rest of the abdomen. Very distended with a fluid wave. Genitourinary/rectal: Deferred Musculoskeletal: Nontender with normal range of motion in all extremities. No joint effusions.  No lower extremity tenderness . 3+ edema bilateral lower extremities without tenderness of the calves Neurologic:  Normal speech and language. No gross focal neurologic deficits are appreciated. Skin:  Skin is warm, dry and intact. No rash noted. Psychiatric: Mood and affect are normal. Speech and behavior are normal.  Patient exhibits appropriate insight and judgment.  ____________________________________________   EKG I, Lisa Roca, MD, the attending physician have personally viewed and interpreted all ECGs.  None ____________________________________________  LABS (pertinent positives/negatives)  Significant for sodium 134, potassium 2.8, chloride 97, AST 60 and a LT 13 with alkaline phosphatase 175 White blood cell count 4.9, hemoglobin 8.2 Magnesium 1.8  ____________________________________________  RADIOLOGY All Xrays were viewed by me. Imagine interpreted by Radiologist.  None __________________________________________  PROCEDURES  Procedure(s) performed: None Critical Care performed: None  ____________________________________________   ED COURSE / ASSESSMENT AND PLAN    Pertinent labs & imaging results that were available during my care of the patient were reviewed by me and considered in my medical decision making (see chart for details).  Patient is here with symptomatic abdominal ascites without any historical or clinical or laboratory findings concerning for spontaneous bacterial peritonitis. Although she does state that she is short of breath, her respiratory effort is normal, and her oxygenation is normal. She will need ascites fluid removed, however nonemergency basis. Patient was given potassium repletion.  She is not appropriate yet with GI, I was able to speak with Dr. Yolanda Bonine nurse practitioner who will see her tomorrow after interventional radiology performs outpatient paracentesis.  Patient / Family / Caregiver informed of clinical course, medical decision-making process, and agree with plan.   I discussed return precautions, follow-up instructions, and discharged instructions with patient and/or family.  ___________________________________________   FINAL CLINICAL IMPRESSION(S) / ED DIAGNOSES   Final diagnoses:  Ascites due to alcoholic cirrhosis   Hypokalemia      Lisa Roca, MD 07/07/14 1346

## 2014-07-07 NOTE — Telephone Encounter (Signed)
Appt has been made for Pt with Vickey Huger, NP. 07/08/14 @ 10:00. Pt advised of date/time/location.

## 2014-07-07 NOTE — ED Notes (Signed)
Patient presents with abdominal swelling. States that she had to have fluid drained off in May and thinks she needs fluid drained again.

## 2014-07-07 NOTE — Telephone Encounter (Signed)
Received call from [place]. Pt needs urgent APPT with me tomorrow or Friday for ETOH cirrhosis/ascites.  Please call to arrange. Thanks

## 2014-07-07 NOTE — ED Notes (Signed)
Pt with distended abd, firm. + bowel sounds. Known cirrosis. Jaundice skin color. +2 edema BLE

## 2014-07-07 NOTE — Discharge Instructions (Signed)
Evaluation in the emergency department does not indicate that you need admission to the hospital, or emergency room paracentesis, however you do need this done quickly, and you are scheduled to have interventional radiology perform a paracentesis tomorrow. You will receive a phone call from the original radiology department to tell you what time to come tomorrow. Also the nurse practitioner for Dr. Allen Norris will see you tomorrow as well, please call the office to find out what time.  Return to the emergency department immediately for any fever, new or worsening abdominal pain, vomiting, or any new or worsening trouble breathing or shortness breath. Ascites Ascites is a gathering of fluid in the belly (abdomen). This is most often caused by liver disease. It may also be caused by a number of other less common problems. It causes a ballooning out (distension) of the abdomen. CAUSES  Scarring of the liver (cirrhosis) is the most common cause of ascites. Other causes include:  Infection or inflammation in the abdomen.  Cancer in the abdomen.  Heart failure.  Certain forms of kidney failure (nephritic syndrome).  Inflammation of the pancreas.  Clots in the veins of the liver. SYMPTOMS  In the early stages of ascites, you may not have any symptoms. The main symptom of ascites is a sense of abdominal bloating. This is due to the presence of fluid. This may also cause an increase in abdominal or waist size. People with this condition can develop swelling in the legs, and men can develop a swollen scrotum. When there is a lot of fluid, it may be hard to breath. Stretching of the abdomen by fluid can be painful. DIAGNOSIS  Certain features of your medical history, such as a history of liver disease and of an enlarging abdomen, can suggest the presence of ascites. The diagnosis of ascites can be made on physical exam by your caregiver. An abdominal ultrasound examination can confirm that ascites is present,  and estimate the amount of fluid. Once ascites is confirmed, it is important to determine its cause. Again, a history of one of the conditions listed in "CAUSES" provides a strong clue. A physical exam is important, and blood and X-ray tests may be needed. During a procedure called paracentesis, a sample of fluid is removed from the abdomen. This can determine certain key features about the fluid, such as whether or not infection or cancer is present. Your caregiver will determine if a paracentesis is necessary. They will describe the procedure to you. PREVENTION  Ascites is a complication of other conditions. Therefore to prevent ascites, you must seek treatment for any significant health conditions you have. Once ascites is present, careful attention to fluid and salt intake may help prevent it from getting worse. If you have ascites, you should not drink alcohol. PROGNOSIS  The prognosis of ascites depends on the underlying disease. If the disease is reversible, such as with certain infections or with heart failure, then ascites may improve or disappear. When ascites is caused by cirrhosis, then it indicates that the liver disease has worsened, and further evaluation and treatment of the liver disease is needed. If your ascites is caused by cancer, then the success or failure of the cancer treatment will determine whether your ascites will improve or worsen. RISKS AND COMPLICATIONS  Ascites is likely to worsen if it is not properly diagnosed and treated. A large amount of ascites can cause pain and difficulty breathing. The main complication, besides worsening, is infection (called spontaneous bacterial peritonitis). This requires  prompt treatment. TREATMENT  The treatment of ascites depends on its cause. When liver disease is your cause, medical management using water pills (diuretics) and decreasing salt intake is often effective. Ascites due to peritoneal inflammation or malignancy (cancer) alone does  not respond to salt restriction and diuretics. Hospitalization is sometimes required. If the treatment of ascites cannot be managed with medications, a number of other treatments are available. Your caregivers will help you decide which will work best for you. Some of these are:  Removal of fluid from the abdomen (paracentesis).  Fluid from the abdomen is passed into a vein (peritoneovenous shunting).  Liver transplantation.  Transjugular intrahepatic portosystemic stent shunt. HOME CARE INSTRUCTIONS  It is important to monitor body weight and the intake and output of fluids. Weigh yourself at the same time every day. Record your weights. Fluid restriction may be necessary. It is also important to know your salt intake. The more salt you take in, the more fluid you will retain. Ninety percent of people with ascites respond to this approach.  Follow any directions for medicines carefully.  Follow up with your caregiver, as directed.  Report any changes in your health, especially any new or worsening symptoms.  If your ascites is from liver disease, avoid alcohol and other substances toxic to the liver. SEEK MEDICAL CARE IF:   Your weight increases more than a few pounds in a few days.  Your abdominal or waist size increases.  You develop swelling in your legs.  You had swelling and it worsens. SEEK IMMEDIATE MEDICAL CARE IF:   You develop a fever.  You develop new abdominal pain.  You develop difficulty breathing.  You develop confusion.  You have bleeding from the mouth, stomach, or rectum. MAKE SURE YOU:   Understand these instructions.  Will watch your condition.  Will get help right away if you are not doing well or get worse. Document Released: 01/01/2005 Document Revised: 03/26/2011 Document Reviewed: 08/02/2006 [facility] Patient Information 2015 [place], [place]. This information is not intended to replace advice given to you by your health care provider. Make sure  you discuss any questions you have with your health care provider.

## 2014-07-08 ENCOUNTER — Ambulatory Visit (INDEPENDENT_AMBULATORY_CARE_PROVIDER_SITE_OTHER): Payer: Medicaid Other | Admitting: Urgent Care

## 2014-07-08 ENCOUNTER — Telehealth: Payer: Self-pay

## 2014-07-08 ENCOUNTER — Encounter: Payer: Self-pay | Admitting: Urgent Care

## 2014-07-08 ENCOUNTER — Other Ambulatory Visit
Admission: RE | Admit: 2014-07-08 | Discharge: 2014-07-08 | Disposition: A | Discharge status: Home / Self Care | Payer: Medicaid Other | Source: Ambulatory Visit | Attending: Urgent Care | Admitting: Urgent Care

## 2014-07-08 VITALS — BP 102/54 | HR 105 | Temp 97.3°F | Ht 64.0 in | Wt 143.5 lb

## 2014-07-08 DIAGNOSIS — K7031 Alcoholic cirrhosis of liver with ascites: Secondary | ICD-10-CM | POA: Diagnosis not present

## 2014-07-08 DIAGNOSIS — K7030 Alcoholic cirrhosis of liver without ascites: Secondary | ICD-10-CM | POA: Insufficient documentation

## 2014-07-08 DIAGNOSIS — R601 Generalized edema: Secondary | ICD-10-CM | POA: Insufficient documentation

## 2014-07-08 DIAGNOSIS — E876 Hypokalemia: Secondary | ICD-10-CM | POA: Diagnosis not present

## 2014-07-08 LAB — CBC WITH DIFFERENTIAL/PLATELET
Basophils Absolute: 0 10*3/uL (ref 0–0.1)
Basophils Relative: 0 %
Eosinophils Absolute: 0.1 10*3/uL (ref 0–0.7)
Eosinophils Relative: 2 %
HCT: 26.5 % — ABNORMAL LOW (ref 35.0–47.0)
HEMOGLOBIN: 8.2 g/dL — AB (ref 12.0–16.0)
LYMPHS ABS: 0.9 10*3/uL — AB (ref 1.0–3.6)
LYMPHS PCT: 17 %
MCH: 26.8 pg (ref 26.0–34.0)
MCHC: 31 g/dL — ABNORMAL LOW (ref 32.0–36.0)
MCV: 86.5 fL (ref 80.0–100.0)
MONO ABS: 0.3 10*3/uL (ref 0.2–0.9)
Monocytes Relative: 7 %
Neutro Abs: 3.7 10*3/uL (ref 1.4–6.5)
Neutrophils Relative %: 74 %
Platelets: 161 10*3/uL (ref 150–440)
RBC: 3.07 MIL/uL — ABNORMAL LOW (ref 3.80–5.20)
RDW: 20.2 % — ABNORMAL HIGH (ref 11.5–14.5)
WBC: 4.9 10*3/uL (ref 3.6–11.0)

## 2014-07-08 LAB — COMPREHENSIVE METABOLIC PANEL
ALT: 13 U/L — AB (ref 14–54)
AST: 62 U/L — ABNORMAL HIGH (ref 15–41)
Albumin: 2.1 g/dL — ABNORMAL LOW (ref 3.5–5.0)
Alkaline Phosphatase: 179 U/L — ABNORMAL HIGH (ref 38–126)
Anion gap: 9 (ref 5–15)
BUN: 8 mg/dL (ref 6–20)
CHLORIDE: 99 mmol/L — AB (ref 101–111)
CO2: 27 mmol/L (ref 22–32)
Calcium: 7.7 mg/dL — ABNORMAL LOW (ref 8.9–10.3)
Creatinine, Ser: 0.71 mg/dL (ref 0.44–1.00)
GFR calc Af Amer: >60 mL/min (ref >60)
GLUCOSE: 109 mg/dL — AB (ref 65–99)
Potassium: 3.3 mmol/L — ABNORMAL LOW (ref 3.5–5.1)
Sodium: 135 mmol/L (ref 135–145)
Total Bilirubin: 1.2 mg/dL (ref 0.3–1.2)
Total Protein: 7 g/dL (ref 6.5–8.1)

## 2014-07-08 LAB — PROTIME-INR
INR: 1.18
PROTHROMBIN TIME: 15.2 s — AB (ref 11.4–15.0)

## 2014-07-08 NOTE — Telephone Encounter (Signed)
Patient seen in office this am. Set-up for Paracentesis at [facility].  Appointment is for 07/09/14 at 1230pm. NPO after MN.  Called patient at this time and explained instructions over the phone. Pt verbalized understanding of these instructions.

## 2014-07-08 NOTE — Progress Notes (Signed)
Gastroenterology Initial Patient Visit  Referring Provider:     Donnie Coffin, MD Primary Care Physician:  Donnie Coffin, MD Primary Gastroenterologist:  Dr. Allen Norris     Reason for Consultation:     ETOH cirrhosis        HPI:   [name] is a 59 y.o. y/o female new patient here to establish care for severe ETOH cirrhosis.  Pt states she was inpatient at [facility] over Mother's Day & had a LVAP.  She has had refractory ascites.  She used to consume heavy ETOH from 1989 to May 2016.  She used to drink 3 glasses of vodka at night.  She has been having SOBOE.  She has pressure in abdomen from fluid.  She has gained a significant amount of weight but cannot quantify.  BMs are normal without melena or rectal bleeding.  Denies fever, chills, clay-colored stools, jaundice, or pruritis. 05/24/14 7.8 Liters removed during LVAP.   ABD [place] 05/21/14-No gallstones are noted within gallbladder. 2. Normal CBD. No sonographic Murphy's sign. 3. Diffuse increased echogenicity of the liver with nodular contour. Chronic liver disease or early cirrhosis cannot be excluded. Perihepatic ascites. Clinical correlation is necessary. PERITONEAL FLUID; PARACENTESIS:  - NEGATIVE FOR MALIGNANT CELLS.  - INFLAMMATION AND MESOTHELIAL CELLS PRESENT.    Component     Latest Ref Rng 05/25/2014 07/07/2014           Glucose     65 - 99 mg/dL 107 (H) 125 (H)  BUN     6 - 20 mg/dL <5 (L) 10  Creatinine     0.44 - 1.00 mg/dL 0.60 0.82  Sodium     135 - 145 mmol/L 135 134 (L)  Potassium     3.5 - 5.1 mmol/L 3.2 (L) 2.8 (LL)  Chloride     101 - 111 mmol/L 102 97 (L)  CO2     22 - 32 mmol/L 28 29  Calcium     8.9 - 10.3 mg/dL 7.3 (L) 7.9 (L)  AST     15 - 41 U/L  60 (H)  ALT     14 - 54 U/L  13 (L)  Alkaline Phosphatase     38 - 126 U/L  175 (H)  Albumin     3.5 - 5.0 g/dL  2.2 (L)  Total Protein     6.5 - 8.1 g/dL  6.9  Total Bilirubin     0.3 - 1.2 mg/dL  1.1  Anion gap     5 - 15 5 8   EGFR (African  American)     >60 mL/min >60 >60  EGFR (Non-African Amer.)     >60 mL/min >60 >60   CBC Latest Ref Rng 07/08/2014 07/07/2014 05/24/2014  WBC 3.6 - 11.0 K/uL 4.9 4.9 3.3(L)  Hemoglobin 12.0 - 16.0 g/dL 8.2(L) 8.2(L) 8.0(L)  Hematocrit 35.0 - 47.0 % 26.5(L) 25.9(L) 25.1(L)  Platelets 150 - 440 K/uL 161 146(L) 106(L)   INR/Prothrombin Time:  1.16  Mag 1.8 Anaerobic cx negative Component     Latest Ref Rng 05/25/2014          Specimen Description      ABDOMEN  Special Requests      NONE  Gram Stain        Culture      NO ANAEROBES ISOLATED  Report Status      05/29/2014 FINAL   Component     Latest Ref Rng 05/24/2014  Fluid Type-FCT  ABDOMEN  Color, Fluid     YELLOW YELLOW  Appearance, Fluid     CLEAR CLEAR (A)  WBC, Fluid      30  Neutrophil Count, Fluid      0  Lymphs, Fluid      56  Monocyte-Macrophage-Serous Fluid      44  Eos, Fluid      0  Other Cells, Fluid      0   Past Medical History  Diagnosis Date  . Cirrhosis of liver   . Hemorrhoid     Past Surgical History  Procedure Laterality Date  . Tonsillectomy    . Appendectomy    . Colonoscopy  2006    Normal    Prior to Admission medications   Medication Sig Start Date End Date Taking? Authorizing Provider  furosemide (LASIX) 20 MG tablet Take 1 tablet (20 mg total) by mouth 2 (two) times daily. 05/25/14  Yes Dustin Flock, MD  oxyCODONE (OXY IR/ROXICODONE) 5 MG immediate release tablet Take 1 tablet (5 mg total) by mouth every 6 (six) hours as needed for moderate pain or severe pain. 07/07/14  Yes Lisa Roca, MD    Family History  Problem Relation Age of Onset  . Hypertension Mother   . Dementia Father   . Colon cancer Neg Hx   . Alcohol abuse Brother   . Alcohol abuse Brother   . Alcohol abuse Brother      History   Social History Narrative   Lives alone, disabled, worked at [employer] previously, 1 daughter, 2 sons-healthy   History  Substance Use Topics  . Smoking status:  Current Every Day Smoker -- 0.50 packs/day for 40 years    Types: Cigarettes  . Smokeless tobacco: Never Used  . Alcohol Use: No     Comment: quit May 2016    Allergies as of 07/08/2014 - Review Complete 07/08/2014  Allergen Reaction Noted  . Penicillins Hives 05/21/2014    Review of Systems:    All systems reviewed and negative except where noted in HPI.   Physical Exam:  BP 102/54 mmHg  Pulse 105  Temp(Src) 97.3 F (36.3 C) (Oral)  Ht 5' 4"  (1.626 m)  Wt 143 lb 8 oz (65.091 kg)  BMI 24.62 kg/m2 No LMP recorded. Patient is postmenopausal. General:   Alert,  Well-developed, chronically ill-appearing, pleasant and cooperative in NAD Head:  Normocephalic and atraumatic. Eyes:  Sclera clear, no icterus.   Conjunctiva pink. Ears:  Normal auditory acuity. Nose:  No deformity, discharge, or lesions. Mouth:  No deformity or lesions,oropharynx pink & moist. Neck:  Supple; no masses or thyromegaly. Lungs:  Respirations even and unlabored.  Clear throughout to auscultation.   No wheezes, crackles, or rhonchi. No acute distress. Heart:  Regular rate and rhythm; no murmurs, clicks, rubs, or gallops. Abdomen:  +Massive tense ascites with protruding umbilicus.  Normal bowel sounds.  No bruits.  Firm, tense.  Unable to palpate hepatosplenomegaly or hernias due to massive ascites.  No guarding or rebound tenderness.     Rectal:  Deferred.  Msk:  Symmetrical without gross deformities.  Good, equal movement & strength bilaterally. Pulses:  Normal pulses noted. Extremities:  No clubbing or edema.  No cyanosis.  No asterixis. Neurologic:  Alert and oriented x3;  grossly normal neurologically. Skin: +Mulitple telangectasias.  Intact without significant lesions or rashes.  No jaundice. Lymph Nodes:  No significant cervical adenopathy. Psych:  Alert and cooperative. Normal mood and affect.

## 2014-07-08 NOTE — Assessment & Plan Note (Signed)
See cirrhosis °

## 2014-07-08 NOTE — Patient Instructions (Signed)
Abdominal paracentesis at [facility] Avoid tylenol & acetaminophen  Avoid alcohol Low sodium diet Please get your labs as soon as possible.  We will call you with results. TO ER if abdominal pain, fever, dizziness or weakness Low-Sodium Eating Plan Sodium raises blood pressure and causes water to be held in the body. Getting less sodium from food will help lower your blood pressure, reduce any swelling, and protect your heart, liver, and kidneys. We get sodium by adding salt (sodium chloride) to food. Most of our sodium comes from canned, boxed, and frozen foods. Restaurant foods, fast foods, and pizza are also very high in sodium. Even if you take medicine to lower your blood pressure or to reduce fluid in your body, getting less sodium from your food is important. WHAT IS MY PLAN? Most people should limit their sodium intake to 2,300 mg a day. Your health care provider recommends that you limit your sodium intake to __________ a day.  WHAT DO I NEED TO KNOW ABOUT THIS EATING PLAN? For the low-sodium eating plan, you will follow these general guidelines:  Choose foods with a % Daily Value for sodium of less than 5% (as listed on the food label).   Use salt-free seasonings or herbs instead of table salt or sea salt.   Check with your health care provider or pharmacist before using salt substitutes.   Eat fresh foods.  Eat more vegetables and fruits.  Limit canned vegetables. If you do use them, rinse them well to decrease the sodium.   Limit cheese to 1 oz (28 g) per day.   Eat lower-sodium products, often labeled as "lower sodium" or "no salt added."  Avoid foods that contain monosodium glutamate (MSG). MSG is sometimes added to [place] food and some canned foods.  Check food labels (Nutrition Facts labels) on foods to learn how much sodium is in one serving.  Eat more home-cooked food and less restaurant, buffet, and fast food.  When eating at a restaurant, ask that your food  be prepared with less salt or none, if possible.  HOW DO I READ FOOD LABELS FOR SODIUM INFORMATION? The Nutrition Facts label lists the amount of sodium in one serving of the food. If you eat more than one serving, you must multiply the listed amount of sodium by the number of servings. Food labels may also identify foods as:  Sodium free--Less than 5 mg in a serving.  Very low sodium--35 mg or less in a serving.  Low sodium--140 mg or less in a serving.  Light in sodium--50% less sodium in a serving. For example, if a food that usually has 300 mg of sodium is changed to become light in sodium, it will have 150 mg of sodium.  Reduced sodium--25% less sodium in a serving. For example, if a food that usually has 400 mg of sodium is changed to reduced sodium, it will have 300 mg of sodium. WHAT FOODS CAN I EAT? Grains Low-sodium cereals, including oats, puffed wheat and rice, and shredded wheat cereals. Low-sodium crackers. Unsalted rice and pasta. Lower-sodium bread.  Vegetables Frozen or fresh vegetables. Low-sodium or reduced-sodium canned vegetables. Low-sodium or reduced-sodium tomato sauce and paste. Low-sodium or reduced-sodium tomato and vegetable juices.  Fruits Fresh, frozen, and canned fruit. Fruit juice.  Meat and Other Protein Products Low-sodium canned tuna and salmon. Fresh or frozen meat, poultry, seafood, and fish. Lamb. Unsalted nuts. Dried beans, peas, and lentils without added salt. Unsalted canned beans. Homemade soups without salt. Eggs.  Dairy Milk. Soy milk. Ricotta cheese. Low-sodium or reduced-sodium cheeses. Yogurt.  Condiments Fresh and dried herbs and spices. Salt-free seasonings. Onion and garlic powders. Low-sodium varieties of mustard and ketchup. Lemon juice.  Fats and Oils Reduced-sodium salad dressings. Unsalted butter.  Other Unsalted popcorn and pretzels.  The items listed above may not be a complete list of recommended foods or beverages.  Contact your dietitian for more options. WHAT FOODS ARE NOT RECOMMENDED? Grains Instant hot cereals. Bread stuffing, pancake, and biscuit mixes. Croutons. Seasoned rice or pasta mixes. Noodle soup cups. Boxed or frozen macaroni and cheese. Self-rising flour. Regular salted crackers. Vegetables Regular canned vegetables. Regular canned tomato sauce and paste. Regular tomato and vegetable juices. Frozen vegetables in sauces. Salted french fries. Olives. Angie Fava. Relishes. Sauerkraut. Salsa. Meat and Other Protein Products Salted, canned, smoked, spiced, or pickled meats, seafood, or fish. Bacon, ham, sausage, hot dogs, corned beef, chipped beef, and packaged luncheon meats. Salt pork. Jerky. Pickled herring. Anchovies, regular canned tuna, and sardines. Salted nuts. Dairy Processed cheese and cheese spreads. Cheese curds. Blue cheese and cottage cheese. Buttermilk.  Condiments Onion and garlic salt, seasoned salt, table salt, and sea salt. Canned and packaged gravies. Worcestershire sauce. Tartar sauce. Barbecue sauce. Teriyaki sauce. Soy sauce, including reduced sodium. Steak sauce. Fish sauce. Oyster sauce. Cocktail sauce. Horseradish. Regular ketchup and mustard. Meat flavorings and tenderizers. Bouillon cubes. Hot sauce. Tabasco sauce. Marinades. Taco seasonings. Relishes. Fats and Oils Regular salad dressings. Salted butter. Margarine. Ghee. Bacon fat.  Other Potato and tortilla chips. Corn chips and puffs. Salted popcorn and pretzels. Canned or dried soups. Pizza. Frozen entrees and pot pies.  The items listed above may not be a complete list of foods and beverages to avoid. Contact your dietitian for more information. Document Released: 06/23/2001 Document Revised: 01/06/2013 Document Reviewed: 11/05/2012 [facility] Patient Information 2015 [place], [place]. This information is not intended to replace advice given to you by your health care provider. Make sure you discuss any questions  you have with your health care provider.

## 2014-07-08 NOTE — Assessment & Plan Note (Addendum)
[name] is a pleasant 59 y.o. female with decompensated ETOH cirrhosis.  She quit ETOH last month.   She has extensive anasarca, mild thrombocytopenia , and significant anemia.    Hepatitis A/B vaccine status:Unkown She will eventually need an EGD to screen for esophageal varices US guided LVAP ASAP.  Will give ALBUMIN 25 GRAMS IF 5L REMOVED and additional 12.5 GRAMS per 2 L removed above 5L. Will send fluid for cell count & diff & culture.  Unfortunately her BP is very low & so is K so adding diuretics is not an option at this time Will recheck labs Low Na diet given Avoid acetaminophen

## 2014-07-09 ENCOUNTER — Ambulatory Visit
Admission: RE | Admit: 2014-07-09 | Discharge: 2014-07-09 | Disposition: A | Discharge status: Home / Self Care | Payer: Medicaid Other | Source: Ambulatory Visit | Attending: Urgent Care | Admitting: Urgent Care

## 2014-07-09 ENCOUNTER — Other Ambulatory Visit: Payer: Self-pay | Admitting: Urgent Care

## 2014-07-09 ENCOUNTER — Encounter: Payer: Self-pay | Admitting: Urgent Care

## 2014-07-09 DIAGNOSIS — Z880 Allergy status to penicillin: Secondary | ICD-10-CM | POA: Insufficient documentation

## 2014-07-09 DIAGNOSIS — I10 Essential (primary) hypertension: Secondary | ICD-10-CM | POA: Insufficient documentation

## 2014-07-09 DIAGNOSIS — K7031 Alcoholic cirrhosis of liver with ascites: Secondary | ICD-10-CM | POA: Diagnosis not present

## 2014-07-09 DIAGNOSIS — R601 Generalized edema: Secondary | ICD-10-CM

## 2014-07-09 DIAGNOSIS — R188 Other ascites: Secondary | ICD-10-CM | POA: Insufficient documentation

## 2014-07-09 DIAGNOSIS — K7030 Alcoholic cirrhosis of liver without ascites: Secondary | ICD-10-CM | POA: Diagnosis present

## 2014-07-09 LAB — AFP TUMOR MARKER: AFP-Tumor Marker: 2.9 ng/mL (ref 0.0–8.3)

## 2014-07-09 LAB — HEPATITIS A ANTIBODY, TOTAL: Hep A Total Ab: NEGATIVE

## 2014-07-09 NOTE — Telephone Encounter (Deleted)
error 

## 2014-07-09 NOTE — Telephone Encounter (Signed)
error 

## 2014-07-12 ENCOUNTER — Telehealth: Payer: Self-pay | Admitting: Urgent Care

## 2014-07-12 NOTE — Telephone Encounter (Signed)
Please call to make FU appt this week with me for anasarca/ETOH cirrhosis Thanks

## 2014-07-13 ENCOUNTER — Telehealth: Payer: Self-pay

## 2014-07-13 ENCOUNTER — Other Ambulatory Visit: Payer: Self-pay | Admitting: Urgent Care

## 2014-07-13 ENCOUNTER — Ambulatory Visit (INDEPENDENT_AMBULATORY_CARE_PROVIDER_SITE_OTHER): Payer: Medicaid Other | Admitting: Urgent Care

## 2014-07-13 ENCOUNTER — Telehealth: Payer: Self-pay | Admitting: Urgent Care

## 2014-07-13 ENCOUNTER — Encounter: Payer: Self-pay | Admitting: Urgent Care

## 2014-07-13 VITALS — BP 125/68 | HR 102 | Temp 97.8°F | Ht 64.0 in | Wt 142.0 lb

## 2014-07-13 DIAGNOSIS — K7031 Alcoholic cirrhosis of liver with ascites: Secondary | ICD-10-CM

## 2014-07-13 DIAGNOSIS — R188 Other ascites: Secondary | ICD-10-CM

## 2014-07-13 DIAGNOSIS — R601 Generalized edema: Secondary | ICD-10-CM

## 2014-07-13 NOTE — Telephone Encounter (Signed)
Spoke with Uremie in the lab and she is looking into the rest of labs that have not resulted. She will return call when she gets to the bottom of this.

## 2014-07-13 NOTE — Patient Instructions (Signed)
Low Na diet Paracentesis once BP ok To ER if weakness, SOB, fever or pain

## 2014-07-13 NOTE — Assessment & Plan Note (Signed)
Lincoln Kleiner is a pleasant 59 y.o. female with decompensated ETOH cirrhosis. She quit ETOH last month. She has extensive anasarca, mild thrombocytopenia , and significant anemia.  Will retry appt for LVAP if BP ok  US guided LVAP ASAP. Will give ALBUMIN 25 GRAMS IF 5L REMOVED and additional 12.5 GRAMS per 2 L removed above 5L. Will send fluid for cell count & diff & culture.  Low Na diet given Avoid acetaminophen

## 2014-07-13 NOTE — Telephone Encounter (Signed)
Half of patient's labs are missing & never resulted.  Please call lab prior to appt today to see what is going on. Thanks

## 2014-07-13 NOTE — Telephone Encounter (Signed)
Spoke with [facility] who explains that other orders were not put in by lab at the time of draw. She can add the rest of the Hepatitis panel however, she is unable to add the Ethanol level. Explained that we do indeed need the Hepatitis labs added on.

## 2014-07-13 NOTE — Telephone Encounter (Signed)
Called patient to let her know that her paracentesis procedure will be done on Thursday 07/15/2014 at 1:45PM at the [facility] Patient was also instructed that she could eat and to not take her Lasix the night before and the morning of. Patient understood and had no further questions.

## 2014-07-13 NOTE — Progress Notes (Signed)
Primary Care Physician: Donnie Coffin, MD Primary Gastroenterologist:  Dr Allen Norris  Chief Complaint  Patient presents with  . Follow-up    Cirrhosis    HPI: [name] is a 59 y.o. female here for follow up of ETOH cirrhosis & ascites.  She was sent for LVAP but it could not be done due to hypotension.  She feels well just tired & discomfort from abdominal ascites.  Denies abd pain, nausea, vomiting, pruritis, fever or jaundice. CBC Latest Ref Rng 07/08/2014 07/07/2014 05/24/2014  WBC 3.6 - 11.0 K/uL 4.9 4.9 3.3(L)  Hemoglobin 12.0 - 16.0 g/dL 8.2(L) 8.2(L) 8.0(L)  Hematocrit 35.0 - 47.0 % 26.5(L) 25.9(L) 25.1(L)  Platelets 150 - 440 K/uL 161 146(L) 106(L)     CMP Latest Ref Rng 07/08/2014 07/07/2014 05/25/2014  Glucose 65 - 99 mg/dL 109(H) 125(H) 107(H)  BUN 6 - 20 mg/dL 8 10 <5(L)  Creatinine 0.44 - 1.00 mg/dL 0.71 0.82 0.60  Sodium 135 - 145 mmol/L 135 134(L) 135  Potassium 3.5 - 5.1 mmol/L 3.3(L) 2.8(LL) 3.2(L)  Chloride 101 - 111 mmol/L 99(L) 97(L) 102  CO2 22 - 32 mmol/L 27 29 28   Calcium 8.9 - 10.3 mg/dL 7.7(L) 7.9(L) 7.3(L)  Total Protein 6.5 - 8.1 g/dL 7.0 6.9 -  Total Bilirubin 0.3 - 1.2 mg/dL 1.2 1.1 -  Alkaline Phos 38 - 126 U/L 179(H) 175(H) -  AST 15 - 41 U/L 62(H) 60(H) -  ALT 14 - 54 U/L 13(L) 13(L) -   AFP normal.  INR 1.18.   Current Outpatient Prescriptions  Medication Sig Dispense Refill  . furosemide (LASIX) 20 MG tablet Take 1 tablet (20 mg total) by mouth 2 (two) times daily. 60 tablet 0   No current facility-administered medications for this visit.    Allergies as of 07/13/2014 - Review Complete 07/13/2014  Allergen Reaction Noted  . Penicillins Hives 05/21/2014    Review of Systems: Gen: Denies any fever, chills, weakness, malaise ENT: Negative for hoarseness, difficulty swallowing , nasal congestion CV: Denies chest pain, angina, palpitations, syncope, orthopnea, PND, peripheral edema, and claudication. Resp: Denies dyspnea at rest, dyspnea  with exercise, cough, sputum, wheezing, coughing up blood, and pleurisy. GI: See HPI GU:  Negative for dysuria, hematuria, urinary incontinence, urinary frequency, nocturnal urination.  Endo: Negative for unusual weight change or sweats Derm: Denies jaundice, rash, itching, or unhealing ulcers.  Psych: Denies depression, anxiety, memory loss, suicidal ideation, hallucinations, paranoia, and confusion. Heme: Denies bruising, bleeding, and enlarged lymph nodes.   Physical Examination:  BP 125/68 mmHg  Pulse 102  Temp(Src) 97.8 F (36.6 C) (Oral)  Wt 142 lb (64.411 kg) Body mass index is 24.36 kg/(m^2). No LMP recorded. Patient is postmenopausal. General: Alert, Well-developed, chronically ill-appearing, pleasant and cooperative in NAD Head: Normocephalic and atraumatic. Eyes: Sclera clear, no icterus. Conjunctiva pink. Ears: Normal auditory acuity. Nose: No deformity, discharge, or lesions. Mouth: No deformity or lesions,oropharynx pink & moist. Neck: Supple; no masses or thyromegaly. Lungs: Respirations even and unlabored. Clear throughout to auscultation. No wheezes, crackles, or rhonchi. No acute distress. Heart: Regular rate and rhythm; no murmurs, clicks, rubs, or gallops. Abdomen: +Massive tense ascites with protruding umbilicus. Normal bowel sounds. No bruits. Firm, tense. Unable to palpate hepatosplenomegaly or hernias due to massive ascites. No guarding or rebound tenderness.  Rectal: Deferred.  Msk: Symmetrical without gross deformities. Good, equal movement & strength bilaterally. Pulses: Normal pulses noted. Extremities: No clubbing or edema. No cyanosis. No asterixis. Neurologic: Alert and oriented  x3; grossly normal neurologically. Skin: +Mulitple telangectasias. Intact without significant lesions or rashes. No jaundice. Lymph Nodes: No significant cervical adenopathy. Psych: Alert and cooperative. Normal mood and affect. Imaging  Studies: US Abdomen Limited  07/09/2014   CLINICAL DATA:  Ascites.  Alcoholic cirrhosis of liver.  EXAM: LIMITED ABDOMEN ULTRASOUND FOR ASCITES  TECHNIQUE: Limited ultrasound survey for ascites was performed in all four abdominal quadrants.  COMPARISON:  May 24, 2014.  FINDINGS: Moderate to large amount of ascites is noted in all 4 quadrants. However, due to patient's hypotension. Paracentesis could not be performed.  IMPRESSION: Significant amount of ascites is present, but paracentesis cannot be performed due to patient's hypertension. Dr. Ronnald Ramp office was notified of this by myself personally.   Electronically Signed   By: Marijo Conception, M.D.   On: 07/09/2014 14:02

## 2014-07-14 ENCOUNTER — Other Ambulatory Visit: Payer: Self-pay | Admitting: Radiology

## 2014-07-14 ENCOUNTER — Encounter: Payer: Self-pay | Admitting: Urgent Care

## 2014-07-14 LAB — HEPATITIS B SURFACE ANTIGEN: HEP B S AG: NEGATIVE

## 2014-07-14 LAB — HEPATITIS C ANTIBODY: HCV Ab: <0.1 s/co ratio (ref 0.0–0.9)

## 2014-07-14 LAB — HEPATITIS B SURFACE ANTIBODY,QUALITATIVE: HEP B S AB: NONREACTIVE

## 2014-07-15 ENCOUNTER — Ambulatory Visit
Admission: RE | Admit: 2014-07-15 | Discharge: 2014-07-15 | Disposition: A | Discharge status: Home / Self Care | Payer: Medicaid Other | Source: Ambulatory Visit | Attending: Urgent Care | Admitting: Urgent Care

## 2014-07-15 DIAGNOSIS — K7031 Alcoholic cirrhosis of liver with ascites: Secondary | ICD-10-CM | POA: Insufficient documentation

## 2014-07-15 DIAGNOSIS — R188 Other ascites: Secondary | ICD-10-CM

## 2014-07-15 DIAGNOSIS — R601 Generalized edema: Secondary | ICD-10-CM | POA: Diagnosis present

## 2014-07-15 LAB — BODY FLUID CELL COUNT WITH DIFFERENTIAL
Eos, Fluid: 0 %
Lymphs, Fluid: 26 %
Monocyte-Macrophage-Serous Fluid: 64 %
Neutrophil Count, Fluid: 10 %
Other Cells, Fluid: 0 %
Total Nucleated Cell Count, Fluid: 113 cu mm

## 2014-07-15 MED ORDER — ALBUMIN HUMAN 25 % IV SOLN
25.0000 g | Freq: Once | INTRAVENOUS | Status: AC
  Administered 2014-07-15: 25 g via INTRAVENOUS
  Filled 2014-07-15: qty 100

## 2014-07-15 MED ORDER — ALBUMIN HUMAN 25 % IV SOLN
12.5000 g | Freq: Once | INTRAVENOUS | Status: DC
  Filled 2014-07-15: qty 50

## 2014-07-16 ENCOUNTER — Telehealth: Payer: Self-pay | Admitting: Urgent Care

## 2014-07-16 LAB — PATHOLOGIST SMEAR REVIEW

## 2014-07-16 NOTE — Telephone Encounter (Signed)
Please make appt with me next week for followup ascites

## 2014-07-18 LAB — BODY FLUID CULTURE: CULTURE: NO GROWTH

## 2014-07-21 ENCOUNTER — Telehealth: Payer: Self-pay | Admitting: Urgent Care

## 2014-07-21 NOTE — Telephone Encounter (Signed)
Called patient and made follow up appointment with [facility] for Friday July 8th, per Kandice's request.

## 2014-07-23 ENCOUNTER — Ambulatory Visit: Payer: Medicaid Other | Admitting: Urgent Care

## 2014-08-25 ENCOUNTER — Other Ambulatory Visit: Payer: Self-pay | Admitting: Gastroenterology

## 2014-08-25 DIAGNOSIS — R188 Other ascites: Secondary | ICD-10-CM

## 2014-08-26 ENCOUNTER — Other Ambulatory Visit: Payer: Self-pay | Admitting: Radiology

## 2014-08-27 ENCOUNTER — Ambulatory Visit
Admission: RE | Admit: 2014-08-27 | Discharge: 2014-08-27 | Disposition: A | Discharge status: Home / Self Care | Payer: Medicaid Other | Source: Ambulatory Visit | Attending: Gastroenterology | Admitting: Gastroenterology

## 2014-08-27 DIAGNOSIS — K7031 Alcoholic cirrhosis of liver with ascites: Secondary | ICD-10-CM | POA: Insufficient documentation

## 2014-08-27 DIAGNOSIS — R188 Other ascites: Secondary | ICD-10-CM

## 2014-08-27 MED ORDER — ALBUMIN HUMAN 25 % IV SOLN
12.5000 g | Freq: Once | INTRAVENOUS | Status: AC
  Administered 2014-08-27: 12.5 g via INTRAVENOUS
  Filled 2014-08-27: qty 50

## 2014-08-27 MED ORDER — ALBUMIN HUMAN 25 % IV SOLN
25.0000 g | Freq: Once | INTRAVENOUS | Status: AC
  Administered 2014-08-27: 25 g via INTRAVENOUS
  Filled 2014-08-27: qty 100

## 2014-09-09 ENCOUNTER — Ambulatory Visit: Payer: Medicaid Other | Admitting: Gastroenterology

## 2014-09-16 ENCOUNTER — Encounter: Payer: Self-pay | Admitting: Gastroenterology

## 2014-09-16 ENCOUNTER — Ambulatory Visit (INDEPENDENT_AMBULATORY_CARE_PROVIDER_SITE_OTHER): Payer: Medicaid Other | Admitting: Gastroenterology

## 2014-09-16 VITALS — BP 124/65 | HR 119 | Temp 98.1°F | Ht 65.0 in | Wt 149.0 lb

## 2014-09-16 DIAGNOSIS — K7031 Alcoholic cirrhosis of liver with ascites: Secondary | ICD-10-CM

## 2014-09-16 MED ORDER — SPIRONOLACTONE 50 MG PO TABS: 50.0000 mg | ORAL_TABLET | Freq: Every day | ORAL | Status: DC

## 2014-09-16 NOTE — Progress Notes (Signed)
Primary Care Physician: Donnie Coffin, MD  Primary Gastroenterologist:  Dr. Lucilla Lame  Chief Complaint  Patient presents with  . Follow up ascites    HPI: [name] is a 59 y.o. female here for follow-up of her alcoholic liver disease and ascites. The patient reports that she had fluid taken off a few weeks ago but it was not done completely because of her blood pressure. She is now on Lasix and comes in today with abdominal discomfort from her distention. She reports that she continues to avoid alcohol.  Current Outpatient Prescriptions  Medication Sig Dispense Refill  . furosemide (LASIX) 20 MG tablet Take 1 tablet (20 mg total) by mouth 2 (two) times daily. 60 tablet 0  . spironolactone (ALDACTONE) 50 MG tablet Take 1 tablet (50 mg total) by mouth daily. 30 tablet 11   No current facility-administered medications for this visit.    Allergies as of 09/16/2014 - Review Complete 09/16/2014  Allergen Reaction Noted  . Penicillins Hives 05/21/2014    ROS:  General: Negative for anorexia, weight loss, fever, chills, fatigue, weakness. ENT: Negative for hoarseness, difficulty swallowing , nasal congestion. CV: Negative for chest pain, angina, palpitations, dyspnea on exertion, peripheral edema.  Respiratory: Negative for dyspnea at rest, dyspnea on exertion, cough, sputum, wheezing.  GI: See history of present illness. GU:  Negative for dysuria, hematuria, urinary incontinence, urinary frequency, nocturnal urination.  Endo: Negative for unusual weight change.    Physical Examination:   BP 124/65 mmHg  Pulse 119  Temp(Src) 98.1 F (36.7 C)  Ht 5\' 5"  (1.651 m)  Wt 149 lb (67.586 kg)  BMI 24.79 kg/m2  General: Well-nourished, well-developed in no acute distress.  Eyes: No icterus. Conjunctivae pink. Mouth: Oropharyngeal mucosa moist and pink , no lesions erythema or exudate. Lungs: Clear to auscultation bilaterally. Non-labored. Heart: Regular rate and rhythm, no  murmurs rubs or gallops.  Abdomen: Protuberant abdomen with shifting dullness and fluid wave consistent with ascites, no rebound or guarding.   Extremities: No lower extremity edema. No clubbing or deformities. Neuro: Alert and oriented x 3.  Grossly intact. Skin: Warm and dry, no jaundice.   Psych: Alert and cooperative, normal mood and affect.  Labs:    Imaging Studies: US Paracentesis  08/27/2014   CLINICAL DATA:  59 year old female with a history of recurrent ascites  EXAM: ULTRASOUND GUIDED  PARACENTESIS  COMPARISON:  07/15/2014  PROCEDURE: An ultrasound guided paracentesis was thoroughly discussed with the patient and questions answered. The benefits, risks, alternatives and complications were also discussed. The patient understands and wishes to proceed with the procedure. Written consent was obtained.  Ultrasound was performed to localize and mark an adequate pocket of fluid in the right lower quadrant of the abdomen. The area was then prepped and draped in the normal sterile fashion. 1% Lidocaine was used for local anesthesia. Under ultrasound guidance a Safe-T-Centesis catheter was introduced. Paracentesis was performed. The catheter was removed and a dressing applied.  COMPLICATIONS: None.  FINDINGS: A total of approximately 9.5 L of thin yellow fluid was removed. A fluid sample was not sent for laboratory analysis.  IMPRESSION: Status post ultrasound-guided therapeutic paracentesis, with 9.5 L thin yellow fluid removed.  Signed,  Mackenzie Fanny. Earleen Newport, DO  [facility]  [facility]   Electronically Signed   By: Corrie Mckusick D.O.   On: 08/27/2014 16:50    Assessment and Plan:   Dashley Monts is a 59 y.o. y/o female with  alcoholic liver disease and cirrhosis. The patient now has been having recurrent rapid tense ascites. The patient will be started on a low-dose of [place] toenail she will also be set up for repeat paracentesis. If the patient  becomes refractory she may be considered for a TIPS procedure. The patient has been explained the risks of encephalopathy with the TIPS. She states she would continue to try the medication and paracentesis before the TIPS. Patient has been explained the plan and agrees with it.   Note: This dictation was prepared with Dragon dictation along with smaller phrase technology. Any transcriptional errors that result from this process are unintentional.

## 2014-09-22 ENCOUNTER — Other Ambulatory Visit: Payer: Self-pay

## 2014-09-22 DIAGNOSIS — K7031 Alcoholic cirrhosis of liver with ascites: Secondary | ICD-10-CM

## 2014-09-24 ENCOUNTER — Other Ambulatory Visit: Payer: Self-pay

## 2014-09-28 ENCOUNTER — Other Ambulatory Visit: Payer: Self-pay | Admitting: Radiology

## 2014-09-29 ENCOUNTER — Ambulatory Visit
Admission: RE | Admit: 2014-09-29 | Discharge: 2014-09-29 | Disposition: A | Discharge status: Home / Self Care | Payer: Medicaid Other | Source: Ambulatory Visit | Attending: Gastroenterology | Admitting: Gastroenterology

## 2014-09-29 ENCOUNTER — Other Ambulatory Visit: Payer: Self-pay

## 2014-09-29 DIAGNOSIS — K7031 Alcoholic cirrhosis of liver with ascites: Secondary | ICD-10-CM

## 2014-09-29 MED ORDER — ALBUMIN HUMAN 25 % IV SOLN: 12.5000 g | Freq: Once | INTRAVENOUS | Status: DC

## 2014-09-30 ENCOUNTER — Ambulatory Visit
Admission: RE | Admit: 2014-09-30 | Discharge: 2014-09-30 | Disposition: A | Discharge status: Home / Self Care | Payer: Medicaid Other | Source: Ambulatory Visit | Attending: Gastroenterology | Admitting: Gastroenterology

## 2014-09-30 ENCOUNTER — Other Ambulatory Visit: Payer: Self-pay | Admitting: Gastroenterology

## 2014-09-30 ENCOUNTER — Other Ambulatory Visit: Payer: Self-pay

## 2014-09-30 DIAGNOSIS — K7031 Alcoholic cirrhosis of liver with ascites: Secondary | ICD-10-CM

## 2014-09-30 MED ORDER — ALBUMIN HUMAN 25 % IV SOLN
25.0000 g | Freq: Once | INTRAVENOUS | Status: AC
  Administered 2014-09-30: 25 g via INTRAVENOUS
  Filled 2014-09-30: qty 100

## 2014-10-10 ENCOUNTER — Inpatient Hospital Stay (HOSPITAL_COMMUNITY): Payer: Medicaid Other | Admitting: Anesthesiology

## 2014-10-10 ENCOUNTER — Inpatient Hospital Stay: Payer: Medicaid Other

## 2014-10-10 ENCOUNTER — Inpatient Hospital Stay
Admission: EM | Admit: 2014-10-10 | Discharge: 2014-10-10 | DRG: 871 | Disposition: A | Discharge status: Other Acute Inpatient Hospital | Payer: Medicaid Other | Attending: Internal Medicine | Admitting: Internal Medicine

## 2014-10-10 ENCOUNTER — Encounter: Payer: Self-pay | Admitting: Emergency Medicine

## 2014-10-10 ENCOUNTER — Encounter (HOSPITAL_COMMUNITY): Payer: Self-pay | Admitting: Anesthesiology

## 2014-10-10 ENCOUNTER — Inpatient Hospital Stay (HOSPITAL_COMMUNITY)
Admission: AD | Admit: 2014-10-10 | Discharge: 2014-10-16 | DRG: 853 | Disposition: A | Discharge status: Home Health | Payer: Medicaid Other | Source: Other Acute Inpatient Hospital | Attending: Internal Medicine | Admitting: Internal Medicine

## 2014-10-10 ENCOUNTER — Encounter (HOSPITAL_COMMUNITY)
Admission: AD | Disposition: A | Discharge status: Home Health | Payer: Self-pay | Source: Other Acute Inpatient Hospital | Attending: Internal Medicine

## 2014-10-10 DIAGNOSIS — F17210 Nicotine dependence, cigarettes, uncomplicated: Secondary | ICD-10-CM | POA: Diagnosis present

## 2014-10-10 DIAGNOSIS — K652 Spontaneous bacterial peritonitis: Secondary | ICD-10-CM | POA: Diagnosis present

## 2014-10-10 DIAGNOSIS — N170 Acute kidney failure with tubular necrosis: Secondary | ICD-10-CM | POA: Diagnosis present

## 2014-10-10 DIAGNOSIS — A419 Sepsis, unspecified organism: Principal | ICD-10-CM | POA: Diagnosis present

## 2014-10-10 DIAGNOSIS — E871 Hypo-osmolality and hyponatremia: Secondary | ICD-10-CM | POA: Diagnosis present

## 2014-10-10 DIAGNOSIS — D62 Acute posthemorrhagic anemia: Secondary | ICD-10-CM | POA: Diagnosis not present

## 2014-10-10 DIAGNOSIS — K659 Peritonitis, unspecified: Secondary | ICD-10-CM | POA: Diagnosis not present

## 2014-10-10 DIAGNOSIS — D689 Coagulation defect, unspecified: Secondary | ICD-10-CM | POA: Diagnosis present

## 2014-10-10 DIAGNOSIS — Z880 Allergy status to penicillin: Secondary | ICD-10-CM

## 2014-10-10 DIAGNOSIS — E872 Acidosis: Secondary | ICD-10-CM | POA: Diagnosis present

## 2014-10-10 DIAGNOSIS — K255 Chronic or unspecified gastric ulcer with perforation: Secondary | ICD-10-CM | POA: Diagnosis present

## 2014-10-10 DIAGNOSIS — R6521 Severe sepsis with septic shock: Secondary | ICD-10-CM | POA: Diagnosis present

## 2014-10-10 DIAGNOSIS — F1010 Alcohol abuse, uncomplicated: Secondary | ICD-10-CM | POA: Diagnosis not present

## 2014-10-10 DIAGNOSIS — J9601 Acute respiratory failure with hypoxia: Secondary | ICD-10-CM | POA: Diagnosis not present

## 2014-10-10 DIAGNOSIS — K7290 Hepatic failure, unspecified without coma: Secondary | ICD-10-CM | POA: Diagnosis present

## 2014-10-10 DIAGNOSIS — K631 Perforation of intestine (nontraumatic): Secondary | ICD-10-CM | POA: Diagnosis not present

## 2014-10-10 DIAGNOSIS — K7031 Alcoholic cirrhosis of liver with ascites: Secondary | ICD-10-CM | POA: Diagnosis present

## 2014-10-10 DIAGNOSIS — K251 Acute gastric ulcer with perforation: Secondary | ICD-10-CM

## 2014-10-10 DIAGNOSIS — E46 Unspecified protein-calorie malnutrition: Secondary | ICD-10-CM | POA: Diagnosis present

## 2014-10-10 DIAGNOSIS — J9600 Acute respiratory failure, unspecified whether with hypoxia or hypercapnia: Secondary | ICD-10-CM

## 2014-10-10 DIAGNOSIS — E876 Hypokalemia: Secondary | ICD-10-CM | POA: Diagnosis present

## 2014-10-10 DIAGNOSIS — R109 Unspecified abdominal pain: Secondary | ICD-10-CM | POA: Diagnosis present

## 2014-10-10 DIAGNOSIS — R1084 Generalized abdominal pain: Secondary | ICD-10-CM

## 2014-10-10 DIAGNOSIS — D259 Leiomyoma of uterus, unspecified: Secondary | ICD-10-CM | POA: Diagnosis present

## 2014-10-10 DIAGNOSIS — E43 Unspecified severe protein-calorie malnutrition: Secondary | ICD-10-CM

## 2014-10-10 DIAGNOSIS — R739 Hyperglycemia, unspecified: Secondary | ICD-10-CM | POA: Diagnosis not present

## 2014-10-10 DIAGNOSIS — R0602 Shortness of breath: Secondary | ICD-10-CM

## 2014-10-10 DIAGNOSIS — N179 Acute kidney failure, unspecified: Secondary | ICD-10-CM | POA: Diagnosis not present

## 2014-10-10 DIAGNOSIS — K429 Umbilical hernia without obstruction or gangrene: Secondary | ICD-10-CM | POA: Diagnosis present

## 2014-10-10 DIAGNOSIS — R188 Other ascites: Secondary | ICD-10-CM

## 2014-10-10 DIAGNOSIS — Z6822 Body mass index (BMI) 22.0-22.9, adult: Secondary | ICD-10-CM

## 2014-10-10 DIAGNOSIS — K709 Alcoholic liver disease, unspecified: Secondary | ICD-10-CM

## 2014-10-10 HISTORY — PX: LAPAROTOMY: SHX154

## 2014-10-10 LAB — LIPASE, BLOOD
LIPASE: 25 U/L (ref 22–51)
Lipase: 29 U/L (ref 22–51)

## 2014-10-10 LAB — COMPREHENSIVE METABOLIC PANEL
ALBUMIN: 2.6 g/dL — AB (ref 3.5–5.0)
ALK PHOS: 68 U/L (ref 38–126)
ALT: 6 U/L — AB (ref 14–54)
ALT: 9 U/L — AB (ref 14–54)
AST: 60 U/L — AB (ref 15–41)
AST: 31 U/L (ref 15–41)
Albumin: 1.9 g/dL — ABNORMAL LOW (ref 3.5–5.0)
Alkaline Phosphatase: 97 U/L (ref 38–126)
Anion gap: 14 (ref 5–15)
Anion gap: 13 (ref 5–15)
BILIRUBIN TOTAL: 1.4 mg/dL — AB (ref 0.3–1.2)
BUN: 23 mg/dL — AB (ref 6–20)
BUN: 24 mg/dL — AB (ref 6–20)
CALCIUM: 7.5 mg/dL — AB (ref 8.9–10.3)
CHLORIDE: 103 mmol/L (ref 101–111)
CO2: 21 mmol/L — ABNORMAL LOW (ref 22–32)
CO2: 17 mmol/L — AB (ref 22–32)
CREATININE: 1.65 mg/dL — AB (ref 0.44–1.00)
CREATININE: 1.8 mg/dL — AB (ref 0.44–1.00)
Calcium: 8.9 mg/dL (ref 8.9–10.3)
Chloride: 102 mmol/L (ref 101–111)
GFR calc Af Amer: 38 mL/min — ABNORMAL LOW (ref >60)
GFR calc Af Amer: 34 mL/min — ABNORMAL LOW (ref >60)
GFR, EST NON AFRICAN AMERICAN: 33 mL/min — AB (ref >60)
GFR, EST NON AFRICAN AMERICAN: 29 mL/min — AB (ref >60)
Glucose, Bld: 125 mg/dL — ABNORMAL HIGH (ref 65–99)
Glucose, Bld: 111 mg/dL — ABNORMAL HIGH (ref 65–99)
POTASSIUM: 3.2 mmol/L — AB (ref 3.5–5.1)
Potassium: 3 mmol/L — ABNORMAL LOW (ref 3.5–5.1)
SODIUM: 133 mmol/L — AB (ref 135–145)
Sodium: 137 mmol/L (ref 135–145)
TOTAL PROTEIN: 6.2 g/dL — AB (ref 6.5–8.1)
Total Bilirubin: 0.9 mg/dL (ref 0.3–1.2)
Total Protein: 5.2 g/dL — ABNORMAL LOW (ref 6.5–8.1)

## 2014-10-10 LAB — CBC WITH DIFFERENTIAL/PLATELET
BASOS ABS: 0 10*3/uL (ref 0–0.1)
BASOS ABS: 0 10*3/uL (ref 0.0–0.1)
BASOS PCT: 0 %
Basophils Relative: 0 %
EOS ABS: 0 10*3/uL (ref 0.0–0.7)
EOS PCT: 0 %
Eosinophils Absolute: 0 10*3/uL (ref 0–0.7)
Eosinophils Relative: 0 %
HCT: 29.3 % — ABNORMAL LOW (ref 36.0–46.0)
HEMATOCRIT: 31.9 % — AB (ref 35.0–47.0)
Hemoglobin: 9.8 g/dL — ABNORMAL LOW (ref 12.0–16.0)
Hemoglobin: 8.5 g/dL — ABNORMAL LOW (ref 12.0–15.0)
LYMPHS ABS: 0.6 10*3/uL — AB (ref 0.7–4.0)
LYMPHS PCT: 8 %
Lymphocytes Relative: 3 %
Lymphs Abs: 0.4 10*3/uL — ABNORMAL LOW (ref 1.0–3.6)
MCH: 23.3 pg — ABNORMAL LOW (ref 26.0–34.0)
MCH: 22.7 pg — ABNORMAL LOW (ref 26.0–34.0)
MCHC: 30.7 g/dL — AB (ref 32.0–36.0)
MCHC: 29 g/dL — ABNORMAL LOW (ref 30.0–36.0)
MCV: 75.9 fL — AB (ref 80.0–100.0)
MCV: 78.3 fL (ref 78.0–100.0)
MONO ABS: 0.2 10*3/uL (ref 0.2–0.9)
MONO ABS: 0.2 10*3/uL (ref 0.1–1.0)
MONOS PCT: 4 %
Monocytes Relative: 1 %
NEUTROS ABS: 4.2 10*3/uL (ref 1.4–6.5)
NEUTROS ABS: 17.9 10*3/uL — AB (ref 1.7–7.7)
Neutrophils Relative %: 88 %
Neutrophils Relative %: 96 %
PLATELETS: 206 10*3/uL (ref 150–440)
PLATELETS: 394 10*3/uL (ref 150–400)
RBC: 4.21 MIL/uL (ref 3.80–5.20)
RBC: 3.74 MIL/uL — ABNORMAL LOW (ref 3.87–5.11)
RDW: 19 % — AB (ref 11.5–14.5)
RDW: 18 % — AB (ref 11.5–15.5)
WBC: 4.8 10*3/uL (ref 3.6–11.0)
WBC: 18.7 10*3/uL — ABNORMAL HIGH (ref 4.0–10.5)

## 2014-10-10 LAB — URINALYSIS, ROUTINE W REFLEX MICROSCOPIC
GLUCOSE, UA: 100 mg/dL — AB
KETONES UR: 15 mg/dL — AB
Nitrite: NEGATIVE
PH: 5.5 (ref 5.0–8.0)
Protein, ur: NEGATIVE mg/dL
Specific Gravity, Urine: 1.029 (ref 1.005–1.030)
Urobilinogen, UA: 1 mg/dL (ref 0.0–1.0)

## 2014-10-10 LAB — MRSA PCR SCREENING: MRSA by PCR: POSITIVE — AB

## 2014-10-10 LAB — LACTIC ACID, PLASMA
LACTIC ACID, VENOUS: 3 mmol/L — AB (ref 0.5–2.0)
LACTIC ACID, VENOUS: 2.8 mmol/L — AB (ref 0.5–2.0)
LACTIC ACID, VENOUS: 3.9 mmol/L — AB (ref 0.5–2.0)
Lactic Acid, Venous: 5.4 mmol/L (ref 0.5–2.0)

## 2014-10-10 LAB — URINE MICROSCOPIC-ADD ON

## 2014-10-10 LAB — PREPARE RBC (CROSSMATCH)

## 2014-10-10 LAB — LACTATE DEHYDROGENASE, PLEURAL OR PERITONEAL FLUID: LD, Fluid: 196 U/L — ABNORMAL HIGH (ref 3–23)

## 2014-10-10 LAB — POCT I-STAT 3, ART BLOOD GAS (G3+)
ACID-BASE DEFICIT: 9 mmol/L — AB (ref 0.0–2.0)
BICARBONATE: 15.5 meq/L — AB (ref 20.0–24.0)
O2 SAT: 90 %
PO2 ART: 62 mmHg — AB (ref 80.0–100.0)
TCO2: 16 mmol/L (ref 0–100)
pCO2 arterial: 29.2 mmHg — ABNORMAL LOW (ref 35.0–45.0)
pH, Arterial: 7.334 — ABNORMAL LOW (ref 7.350–7.450)

## 2014-10-10 LAB — BODY FLUID CELL COUNT WITH DIFFERENTIAL
LYMPHS FL: 3 %
MONOCYTE-MACROPHAGE-SEROUS FLUID: 2 %
NEUTROPHIL FLUID: 95 %
Total Nucleated Cell Count, Fluid: 13761 cu mm

## 2014-10-10 LAB — PROCALCITONIN: PROCALCITONIN: 25.82 ng/mL

## 2014-10-10 LAB — AMYLASE: Amylase: 129 U/L — ABNORMAL HIGH (ref 28–100)

## 2014-10-10 LAB — MAGNESIUM: Magnesium: 1.5 mg/dL — ABNORMAL LOW (ref 1.7–2.4)

## 2014-10-10 LAB — PROTIME-INR
INR: 1.24
INR: 1.49 (ref 0.00–1.49)
PROTHROMBIN TIME: 18.1 s — AB (ref 11.6–15.2)
Prothrombin Time: 15.8 seconds — ABNORMAL HIGH (ref 11.4–15.0)

## 2014-10-10 LAB — ALBUMIN, FLUID (OTHER): ALBUMIN FL: 1.2 g/dL

## 2014-10-10 LAB — CORTISOL: CORTISOL PLASMA: 34.7 ug/dL

## 2014-10-10 LAB — APTT
APTT: 35 s (ref 24–37)
aPTT: 31 seconds (ref 24–36)

## 2014-10-10 LAB — GLUCOSE, CAPILLARY: Glucose-Capillary: 103 mg/dL — ABNORMAL HIGH (ref 65–99)

## 2014-10-10 LAB — PROTEIN, BODY FLUID: Total protein, fluid: <3 g/dL

## 2014-10-10 LAB — GLUCOSE, SEROUS FLUID: Glucose, Fluid: <20 mg/dL

## 2014-10-10 LAB — TROPONIN I

## 2014-10-10 LAB — PHOSPHORUS: Phosphorus: 5.8 mg/dL — ABNORMAL HIGH (ref 2.5–4.6)

## 2014-10-10 LAB — BRAIN NATRIURETIC PEPTIDE: B NATRIURETIC PEPTIDE 5: 287.5 pg/mL — AB (ref 0.0–100.0)

## 2014-10-10 SURGERY — LAPAROTOMY, EXPLORATORY
Anesthesia: General | Site: Abdomen

## 2014-10-10 MED ORDER — CISATRACURIUM BESYLATE 20 MG/10ML IV SOLN
INTRAVENOUS | Status: AC
  Filled 2014-10-10: qty 10

## 2014-10-10 MED ORDER — SUCCINYLCHOLINE CHLORIDE 20 MG/ML IJ SOLN
INTRAMUSCULAR | Status: DC | PRN
  Administered 2014-10-10: 100 mg via INTRAVENOUS

## 2014-10-10 MED ORDER — SODIUM CHLORIDE 0.9 % IV BOLUS (SEPSIS)
1000.0000 mL | Freq: Once | INTRAVENOUS | Status: AC
  Administered 2014-10-10: 1000 mL via INTRAVENOUS

## 2014-10-10 MED ORDER — PANTOPRAZOLE SODIUM 40 MG IV SOLR
40.0000 mg | INTRAVENOUS | Status: DC
  Administered 2014-10-10: 40 mg via INTRAVENOUS
  Filled 2014-10-10 (×2): qty 40

## 2014-10-10 MED ORDER — IOHEXOL 240 MG/ML SOLN
25.0000 mL | INTRAMUSCULAR | Status: AC
  Administered 2014-10-10 (×2): 25 mL via ORAL

## 2014-10-10 MED ORDER — DEXTROSE 5 % IV SOLN
2.0000 g | INTRAVENOUS | Status: AC
  Administered 2014-10-10: 2 g via INTRAVENOUS
  Filled 2014-10-10: qty 2

## 2014-10-10 MED ORDER — MUPIROCIN 2 % EX OINT
1.0000 "application " | TOPICAL_OINTMENT | Freq: Two times a day (BID) | CUTANEOUS | Status: AC
  Administered 2014-10-10 – 2014-10-15 (×10): 1 via NASAL
  Filled 2014-10-10 (×3): qty 22

## 2014-10-10 MED ORDER — NOREPINEPHRINE BITARTRATE 1 MG/ML IV SOLN: 0.0000 ug/min | INTRAVENOUS | Status: DC

## 2014-10-10 MED ORDER — HYDROMORPHONE HCL 1 MG/ML IJ SOLN
0.5000 mg | Freq: Once | INTRAMUSCULAR | Status: AC
  Administered 2014-10-10: 0.5 mg via INTRAVENOUS

## 2014-10-10 MED ORDER — FENTANYL CITRATE (PF) 250 MCG/5ML IJ SOLN
INTRAMUSCULAR | Status: AC
  Filled 2014-10-10: qty 5

## 2014-10-10 MED ORDER — HYDROMORPHONE HCL 1 MG/ML IJ SOLN
0.5000 mg | INTRAMUSCULAR | Status: DC | PRN
  Administered 2014-10-11 – 2014-10-12 (×6): 0.5 mg via INTRAVENOUS
  Filled 2014-10-10 (×7): qty 1

## 2014-10-10 MED ORDER — HYDROMORPHONE HCL 1 MG/ML IJ SOLN: 0.2500 mg | INTRAMUSCULAR | Status: DC | PRN

## 2014-10-10 MED ORDER — METRONIDAZOLE IN NACL 5-0.79 MG/ML-% IV SOLN
500.0000 mg | Freq: Three times a day (TID) | INTRAVENOUS | Status: DC
  Administered 2014-10-10 – 2014-10-16 (×17): 500 mg via INTRAVENOUS
  Filled 2014-10-10 (×19): qty 100

## 2014-10-10 MED ORDER — CISATRACURIUM BESYLATE (PF) 10 MG/5ML IV SOLN
INTRAVENOUS | Status: DC | PRN
  Administered 2014-10-10: 4 mg via INTRAVENOUS

## 2014-10-10 MED ORDER — VANCOMYCIN HCL IN DEXTROSE 1-5 GM/200ML-% IV SOLN: 1000.0000 mg | Freq: Two times a day (BID) | INTRAVENOUS | Status: DC

## 2014-10-10 MED ORDER — HYDROMORPHONE HCL 1 MG/ML IJ SOLN
INTRAMUSCULAR | Status: AC
  Administered 2014-10-10: 0.5 mg via INTRAVENOUS
  Filled 2014-10-10: qty 1

## 2014-10-10 MED ORDER — CHLORHEXIDINE GLUCONATE CLOTH 2 % EX PADS
6.0000 | MEDICATED_PAD | Freq: Every day | CUTANEOUS | Status: AC
  Administered 2014-10-11 – 2014-10-15 (×5): 6 via TOPICAL

## 2014-10-10 MED ORDER — OXYCODONE HCL 5 MG PO TABS: 5.0000 mg | ORAL_TABLET | Freq: Once | ORAL | Status: DC | PRN

## 2014-10-10 MED ORDER — ETOMIDATE 2 MG/ML IV SOLN
INTRAVENOUS | Status: DC | PRN
Start: 2014-10-10 — End: 2014-10-11
  Administered 2014-10-10: 16 mg via INTRAVENOUS

## 2014-10-10 MED ORDER — SODIUM CHLORIDE 0.9 % IV SOLN
Freq: Once | INTRAVENOUS | Status: AC
  Administered 2014-10-10: 15:00:00 via INTRAVENOUS

## 2014-10-10 MED ORDER — NOREPINEPHRINE BITARTRATE 1 MG/ML IV SOLN
4000.0000 ug | INTRAVENOUS | Status: DC | PRN
  Administered 2014-10-10: 25 ug/min via INTRAVENOUS

## 2014-10-10 MED ORDER — VANCOMYCIN HCL IN DEXTROSE 1-5 GM/200ML-% IV SOLN
1000.0000 mg | INTRAVENOUS | Status: DC
  Administered 2014-10-11 – 2014-10-13 (×3): 1000 mg via INTRAVENOUS
  Filled 2014-10-10 (×4): qty 200

## 2014-10-10 MED ORDER — FENTANYL CITRATE (PF) 100 MCG/2ML IJ SOLN
INTRAMUSCULAR | Status: DC | PRN
  Administered 2014-10-10: 100 ug via INTRAVENOUS

## 2014-10-10 MED ORDER — SODIUM CHLORIDE 0.9 % IV SOLN
250.0000 mL | INTRAVENOUS | Status: DC | PRN
  Administered 2014-10-10 (×2): via INTRAVENOUS

## 2014-10-10 MED ORDER — METRONIDAZOLE IN NACL 5-0.79 MG/ML-% IV SOLN
500.0000 mg | Freq: Once | INTRAVENOUS | Status: AC
  Administered 2014-10-10: 500 mg via INTRAVENOUS
  Filled 2014-10-10: qty 100

## 2014-10-10 MED ORDER — DEXTROSE 5 % IV SOLN
2.0000 g | Freq: Two times a day (BID) | INTRAVENOUS | Status: DC
  Administered 2014-10-11 – 2014-10-13 (×6): 2 g via INTRAVENOUS
  Filled 2014-10-10 (×8): qty 2

## 2014-10-10 MED ORDER — LIDOCAINE-EPINEPHRINE (PF) 1 %-1:200000 IJ SOLN
30.0000 mL | Freq: Once | INTRAMUSCULAR | Status: AC
  Administered 2014-10-10: 30 mL via INTRADERMAL
  Filled 2014-10-10: qty 30

## 2014-10-10 MED ORDER — VANCOMYCIN HCL 10 G IV SOLR
1500.0000 mg | Freq: Once | INTRAVENOUS | Status: AC
  Administered 2014-10-10: 1500 mg via INTRAVENOUS
  Filled 2014-10-10 (×2): qty 1500

## 2014-10-10 MED ORDER — MORPHINE SULFATE (PF) 2 MG/ML IV SOLN
1.0000 mg | Freq: Once | INTRAVENOUS | Status: AC
  Administered 2014-10-10: 1 mg via INTRAVENOUS
  Filled 2014-10-10: qty 1

## 2014-10-10 MED ORDER — POTASSIUM CHLORIDE 10 MEQ/100ML IV SOLN
10.0000 meq | INTRAVENOUS | Status: AC
  Administered 2014-10-10 – 2014-10-11 (×4): 10 meq via INTRAVENOUS
  Filled 2014-10-10 (×4): qty 100

## 2014-10-10 MED ORDER — NOREPINEPHRINE BITARTRATE 1 MG/ML IV SOLN
0.0000 ug/min | INTRAVENOUS | Status: DC
  Administered 2014-10-10: 25 ug/min via INTRAVENOUS
  Administered 2014-10-11 (×2): 40 ug/min via INTRAVENOUS
  Administered 2014-10-11: 38 ug/min via INTRAVENOUS
  Filled 2014-10-10 (×6): qty 16

## 2014-10-10 MED ORDER — NOREPINEPHRINE 4 MG/250ML-% IV SOLN
0.0000 ug/min | INTRAVENOUS | Status: DC
  Administered 2014-10-10: 2 ug/min via INTRAVENOUS
  Filled 2014-10-10: qty 250

## 2014-10-10 MED ORDER — ONDANSETRON HCL 4 MG/2ML IJ SOLN: 4.0000 mg | Freq: Four times a day (QID) | INTRAMUSCULAR | Status: DC | PRN

## 2014-10-10 MED ORDER — PROPOFOL 500 MG/50ML IV EMUL
INTRAVENOUS | Status: DC | PRN
  Administered 2014-10-10: 25 ug/kg/min via INTRAVENOUS

## 2014-10-10 MED ORDER — OXYCODONE HCL 5 MG/5ML PO SOLN: 5.0000 mg | Freq: Once | ORAL | Status: DC | PRN

## 2014-10-10 MED ORDER — DEXTROSE 5 % IV SOLN: 2.0000 g | Freq: Four times a day (QID) | INTRAVENOUS | Status: DC

## 2014-10-10 MED ORDER — SODIUM CHLORIDE 0.9 % IV SOLN
INTRAVENOUS | Status: DC
  Administered 2014-10-10 – 2014-10-12 (×5): via INTRAVENOUS

## 2014-10-10 MED ORDER — PROPOFOL 1000 MG/100ML IV EMUL
5.0000 ug/kg/min | INTRAVENOUS | Status: DC
  Administered 2014-10-11 (×2): 40 ug/kg/min via INTRAVENOUS
  Filled 2014-10-10: qty 200

## 2014-10-10 MED ORDER — NOREPINEPHRINE BITARTRATE 1 MG/ML IV SOLN
0.0000 ug/min | INTRAVENOUS | Status: DC
  Filled 2014-10-10: qty 4

## 2014-10-10 MED ORDER — ALBUMIN HUMAN 5 % IV SOLN
INTRAVENOUS | Status: DC | PRN
  Administered 2014-10-10: 22:00:00 via INTRAVENOUS

## 2014-10-10 MED ORDER — 0.9 % SODIUM CHLORIDE (POUR BTL) OPTIME
TOPICAL | Status: DC | PRN
  Administered 2014-10-10: 14000 mL

## 2014-10-10 SURGICAL SUPPLY — 57 items
BLADE SURG ROTATE 9660 (MISCELLANEOUS) IMPLANT
BNDG GAUZE ELAST 4 BULKY (GAUZE/BANDAGES/DRESSINGS) ×6 IMPLANT
CANISTER SUCTION 2500CC (MISCELLANEOUS) ×3 IMPLANT
CHLORAPREP W/TINT 26ML (MISCELLANEOUS) ×3 IMPLANT
COVER MAYO STAND STRL (DRAPES) IMPLANT
COVER SURGICAL LIGHT HANDLE (MISCELLANEOUS) ×3 IMPLANT
DRAPE LAPAROSCOPIC ABDOMINAL (DRAPES) ×3 IMPLANT
DRAPE PROXIMA HALF (DRAPES) IMPLANT
DRAPE UTILITY XL STRL (DRAPES) IMPLANT
DRAPE WARM FLUID 44X44 (DRAPE) ×3 IMPLANT
DRSG OPSITE POSTOP 4X10 (GAUZE/BANDAGES/DRESSINGS) IMPLANT
DRSG OPSITE POSTOP 4X8 (GAUZE/BANDAGES/DRESSINGS) IMPLANT
ELECT BLADE 6.5 EXT (BLADE) ×3 IMPLANT
ELECT CAUTERY BLADE 6.4 (BLADE) ×3 IMPLANT
ELECT REM PT RETURN 9FT ADLT (ELECTROSURGICAL) ×3
ELECTRODE REM PT RTRN 9FT ADLT (ELECTROSURGICAL) ×1 IMPLANT
GLOVE BIO SURGEON STRL SZ 6 (GLOVE) ×6 IMPLANT
GLOVE BIOGEL PI IND STRL 6.5 (GLOVE) ×3 IMPLANT
GLOVE BIOGEL PI IND STRL 8 (GLOVE) ×2 IMPLANT
GLOVE BIOGEL PI INDICATOR 6.5 (GLOVE) ×6
GLOVE BIOGEL PI INDICATOR 8 (GLOVE) ×4
GLOVE ECLIPSE 8.0 STRL XLNG CF (GLOVE) ×3 IMPLANT
GLOVE SURG SS PI 6.0 STRL IVOR (GLOVE) ×6 IMPLANT
GOWN STRL REUS W/ TWL LRG LVL3 (GOWN DISPOSABLE) ×2 IMPLANT
GOWN STRL REUS W/TWL 2XL LVL3 (GOWN DISPOSABLE) ×3 IMPLANT
GOWN STRL REUS W/TWL LRG LVL3 (GOWN DISPOSABLE) ×4
KIT BASIN OR (CUSTOM PROCEDURE TRAY) ×3 IMPLANT
KIT ROOM TURNOVER OR (KITS) ×3 IMPLANT
LIGASURE IMPACT 36 18CM CVD LR (INSTRUMENTS) IMPLANT
NS IRRIG 1000ML POUR BTL (IV SOLUTION) ×42 IMPLANT
PACK GENERAL/GYN (CUSTOM PROCEDURE TRAY) ×3 IMPLANT
PAD ARMBOARD 7.5X6 YLW CONV (MISCELLANEOUS) ×3 IMPLANT
PENCIL BUTTON HOLSTER BLD 10FT (ELECTRODE) IMPLANT
SPECIMEN JAR LARGE (MISCELLANEOUS) IMPLANT
SPONGE GAUZE 4X4 12PLY STER LF (GAUZE/BANDAGES/DRESSINGS) ×3 IMPLANT
SPONGE LAP 18X18 X RAY DECT (DISPOSABLE) IMPLANT
STAPLER VISISTAT 35W (STAPLE) ×3 IMPLANT
SUCTION POOLE TIP (SUCTIONS) ×3 IMPLANT
SUT NOVA NAB DX-16 0-1 5-0 T12 (SUTURE) ×3 IMPLANT
SUT PDS AB 1 CT  36 (SUTURE) ×2
SUT PDS AB 1 CT 36 (SUTURE) ×1 IMPLANT
SUT PDS AB 1 TP1 96 (SUTURE) ×6 IMPLANT
SUT PDS II 0 TP-1 LOOPED 60 (SUTURE) IMPLANT
SUT SILK 2 0 SH CR/8 (SUTURE) ×3 IMPLANT
SUT SILK 3 0 SH CR/8 (SUTURE) ×3 IMPLANT
SUT VIC AB 2-0 SH 18 (SUTURE) IMPLANT
SUT VIC AB 3-0 SH 18 (SUTURE) IMPLANT
SUT VICRYL 4-0 PS2 18IN ABS (SUTURE) IMPLANT
SUT VICRYL AB 2 0 TIES (SUTURE) ×3 IMPLANT
SUT VICRYL AB 3 0 TIES (SUTURE) ×3 IMPLANT
TAPE CLOTH SURG 6X10 WHT LF (GAUZE/BANDAGES/DRESSINGS) ×3 IMPLANT
TOWEL OR 17X24 6PK STRL BLUE (TOWEL DISPOSABLE) ×3 IMPLANT
TOWEL OR 17X26 10 PK STRL BLUE (TOWEL DISPOSABLE) ×6 IMPLANT
TRAY FOLEY CATH 16FRSI W/METER (SET/KITS/TRAYS/PACK) IMPLANT
TUBE CONNECTING 12'X1/4 (SUCTIONS)
TUBE CONNECTING 12X1/4 (SUCTIONS) IMPLANT
YANKAUER SUCT BULB TIP NO VENT (SUCTIONS) IMPLANT

## 2014-10-10 NOTE — Consult Note (Signed)
[facility] [place] NAME: [name]    MR#:  [id number]  DATE OF BIRTH:  07-15-1955  DATE OF ADMISSION:  10/10/2014  PRIMARY CARE PHYSICIAN: Donnie Coffin, MD   REQUESTING/REFERRING PHYSICIAN: Dr. Joni Fears  CHIEF COMPLAINT:   Chief Complaint  Patient presents with  . Abdominal Pain    HISTORY OF PRESENT ILLNESS:  [name]  is a 59 y.o. female with a known history of alcoholic liver cirrhosis diagnosed about 6 months ago, worsening ascites requiring more frequent tabs presents to the hospital secondary to worsening abdominal pain.   patient states she was fine until yesterday, Ate [place] food yesterday afternoon and last evening around 4:00 she started having significant lower abdominal pain. Her belly is very distended from ascites, but she states this is normal for her. She just had about 10.5 L taken out from her abdomen about 10 days ago. Her ascites is getting worse and she's been needing more frequent taps. She is being followed by gastroenterology as outpatient. Patient denies any nausea or vomiting. Denies any fevers or chills. Her blood pressure is on the low normal side at this time. Takes ibuprofen prn for pain CT abd with perforated gastric ulcer likely and peritonitis signs.  PAST MEDICAL HISTORY:   Past Medical History  Diagnosis Date  . Cirrhosis of liver   . Hemorrhoid     PAST SURGICAL HISTOIRY:   Past Surgical History  Procedure Laterality Date  . Tonsillectomy    . Appendectomy    . Colonoscopy  2006    Normal  . Excisional hemorrhoidectomy      SOCIAL HISTORY:   Social History  Substance Use Topics  . Smoking status: Current Every Day Smoker -- 0.50 packs/day for 40 years    Types: Cigarettes  . Smokeless tobacco: Never Used  . Alcohol Use: No     Comment: quit May 2016    FAMILY HISTORY:   Family History  Problem Relation Age of Onset  . Hypertension Mother   . Dementia Father    . Colon cancer Neg Hx   . Alcohol abuse Brother   . Alcohol abuse Brother   . Alcohol abuse Brother     DRUG ALLERGIES:   Allergies  Allergen Reactions  . Penicillins Hives    Has patient had a PCN reaction causing immediate rash, facial/tongue/throat swelling, SOB or lightheadedness with hypotension: Yes Has patient had a PCN reaction causing severe rash involving mucus membranes or skin necrosis: No Has patient had a PCN reaction that required hospitalization No Has patient had a PCN reaction occurring within the last 10 years: No If all of the above answers are "NO", then may proceed with Cephalosporin use.    REVIEW OF SYSTEMS:   Review of Systems  Constitutional: Negative for fever, chills, weight loss and malaise/fatigue.  HENT: Negative for ear discharge, ear pain, hearing loss, nosebleeds and tinnitus.   Eyes: Negative for blurred vision, double vision and photophobia.  Respiratory: Negative for cough, hemoptysis, shortness of breath and wheezing.   Cardiovascular: Negative for chest pain, palpitations, orthopnea and leg swelling.  Gastrointestinal: Positive for abdominal pain. Negative for heartburn, nausea, vomiting, diarrhea, constipation and melena.  Genitourinary: Negative for dysuria, urgency, frequency and hematuria.  Musculoskeletal: Negative for myalgias, back pain and neck pain.  Skin: Negative for rash.  Neurological: Positive for weakness. Negative for dizziness, tingling, tremors, sensory change, speech change, focal weakness and headaches.  Endo/Heme/Allergies: Does  not bruise/bleed easily.  Psychiatric/Behavioral: Negative for depression.      MEDICATIONS AT HOME:   Prior to Admission medications   Medication Sig Start Date End Date Taking? Authorizing Marieme Mcmackin  furosemide (LASIX) 20 MG tablet Take 1 tablet (20 mg total) by mouth 2 (two) times daily. 05/25/14  Yes Dustin Flock, MD  spironolactone (ALDACTONE) 50 MG tablet Take 1 tablet (50 mg total)  by mouth daily. 09/16/14  Yes Lucilla Lame, MD  albumin human 25 % bottle Inject 50 mLs (12.5 g total) into the vein once. Give 8 grams IV Albumin if greater than 5L of fluid pulled off, including first 5L 09/29/14   Lucilla Lame, MD      VITAL SIGNS:  Blood pressure 74/52, pulse 89, temperature 97.5 F (36.4 C), temperature source Oral, resp. rate 14, height 5\' 5"  (1.651 m), weight 61.236 kg (135 lb), SpO2 99 %.  PHYSICAL EXAMINATION:   Physical Exam  GENERAL:  59 y.o.-year-old patient lying in the bed with mild distress secondary to abdominal pain.  EYES: Pupils equal, round, reactive to light and accommodation. Icteric sclerae. Extraocular muscles intact.  HEENT: Head atraumatic, normocephalic. Oropharynx and nasopharynx clear.  NECK:  Supple, no jugular venous distention. No thyroid enlargement, no tenderness.  LUNGS: Normal breath sounds bilaterally, no wheezing, rales,rhonchi or crepitation. No use of accessory muscles of respiration.  CARDIOVASCULAR: S1, S2 normal. No murmurs, rubs, or gallops.  ABDOMEN: Extremely distended abdomen, dilated veins noted superficially on the abdomen. Umbilical hernia noted. There's a tender bruised spot right about the umbilicus. Significant tenderness noted all over the abdomen but worse with voluntary guarding in the lower abdominal region. Hypoactive bowel sounds heard. Fluid thrill present. EXTREMITIES: 1+ pedal edema, no cyanosis, or clubbing.  NEUROLOGIC: Cranial nerves II through XII are intact. Muscle strength 5/5 in all extremities. Sensation intact. Gait not checked.  PSYCHIATRIC: The patient is alert and oriented x 3.  SKIN: No obvious rash, lesion, or ulcer.   LABORATORY PANEL:   CBC  Recent Labs Lab 10/10/14 0710  WBC 4.8  HGB 9.8*  HCT 31.9*  PLT 206   ------------------------------------------------------------------------------------------------------------------  Chemistries   Recent Labs Lab 10/10/14 0710  NA 137  K 3.2*   CL 102  CO2 21*  GLUCOSE 125*  BUN 23*  CREATININE 1.65*  CALCIUM 8.9  AST 60*  ALT 6*  ALKPHOS 97  BILITOT 1.4*   ------------------------------------------------------------------------------------------------------------------  Cardiac Enzymes No results for input(s): TROPONINI in the last 168 hours. ------------------------------------------------------------------------------------------------------------------  RADIOLOGY:  Ct Abdomen Pelvis Wo Contrast  10/10/2014   CLINICAL DATA:  Decrease abdominal pain, liver disease, ascites  EXAM: CT ABDOMEN AND PELVIS WITHOUT CONTRAST  TECHNIQUE: Multidetector CT imaging of the abdomen and pelvis was performed following the standard protocol without IV contrast.  COMPARISON:  Right upper quadrant ultrasound dated 05/21/2014  FINDINGS: Lower chest: Trace bilateral pleural effusions. Mild dependent atelectasis the lung bases.  Hepatobiliary: Cirrhotic configuration of the liver.  Gallbladder is unremarkable. No intrahepatic or extrahepatic ductal dilatation.  Pancreas: Within normal limits.  Spleen: Splenomegaly.  Adrenals/Urinary Tract: Adrenal glands are within normal limits.  Right kidney is notable for three nonobstructing right renal calculi measuring up to 4 mm. 1.4 cm lateral right upper pole renal cyst (series 2/ image 44).  4 mm posterior interpolar left renal cyst (series 2/ image 41).  No hydronephrosis.  Bladder is underdistended but unremarkable.  Stomach/Bowel: Contrast within the stomach. Associated extraluminal contrast in the right mid abdomen (series 2/images 26 and  29), associated with the distal gastric antrum and proximal duodenum.  Focal contrast along the posterior aspect of the gastric antrum suggest a perforated gastric ulcer (series 2/image 33).  Scattered foci of free air with additional moderate pneumoperitoneum in the anterior abdomen (series 2/ images 41 and 62).  No evidence of bowel obstruction.  Appendix is not  discretely visualized.  Mild wall thickening involving the right colon (series 2/ image 65), possibly secondary to ascites, infectious/ inflammatory colitis considered less likely but not excluded.  Colonic diverticulosis, without evidence of diverticulitis.  Vascular/Lymphatic: Atherosclerotic calcifications of the abdominal aorta and branch vessels.  No suspicious abdominopelvic lymphadenopathy.  Reproductive: Uterus is notable for multiple uterine fibroids, including a 4.6 cm calcified subserosal fundal fibroid (series 2/image 78).  No adnexal masses.  Other: Large volume abdominopelvic ascites.  Musculoskeletal: Degenerative changes of the visualized thoracolumbar spine, most prominent at L4-5.  IMPRESSION: Extraluminal contrast along the distal stomach/proximal duodenum, possibly secondary to a perforated gastric ulcer along the posterior gastric antrum.  Associated moderate pneumoperitoneum.  Large volume abdominopelvic ascites.  Additional ancillary findings as above.  Critical Value/emergent results were called by telephone at the time of interpretation on 10/10/2014 at 11:00 am to Dr. Carrie Mew, who verbally acknowledged these results.   Electronically Signed   By: Julian Hy M.D.   On: 10/10/2014 11:01   Dg Chest Portable 1 View  10/10/2014   CLINICAL DATA:  Placement of right internal jugular line.  EXAM: PORTABLE CHEST 1 VIEW  COMPARISON:  None.  FINDINGS: Right internal jugular central line is in place with the tip in the lower right atrium or possibly into the IVC. This is approximately 10 cm below the cavoatrial junction. Low lung volumes. Minimal bibasilar atelectasis. Heart is normal size. No acute bony abnormality.  IMPRESSION: Right internal jugular central line passes were well into the right atrium or possibly through the heart into the IVC, approximately 10 cm below the cavoatrial junction.  Low lung volumes, bibasilar atelectasis.   Electronically Signed   By: Rolm Baptise M.D.    On: 10/10/2014 13:07    EKG:   Orders placed or performed in visit on 01/24/14  . EKG 12-Lead    IMPRESSION AND PLAN:   59 year old Caucasian female with past medical history significant for large volume ascites, alcoholic liver cirrhosis presents to the hospital secondary to abdominal pain.  #1 perforated gastric ulcer-CT of the abdomen on admission showing perforated gastric ulcer with moderate pneumoperitoneum. -She has large amounts of ascites. She would be a high-risk candidate for surgery. -Surgical consult requested and it would be in patient's better interest for her to be transferred to a tertiary care facility.  #2 septic shock-likely from perforated ulcer and may be SBP with ascites -Empiric antibiotics with Rocephin recommended -We'll need ICU admission and pressors if needed. Lactic acid is elevated.  #3 large volume ascites-secondary to liver cirrhosis. Recently 10.5 L tapped about 10 days ago. - Diuretics on hold due to hypotension  #4 alcoholic liver cirrhosis-quit drinking about 6 months ago. -GI need to be consulted.  #5 hypokalemia-will need to be replaced  #6 tobacco use disorder-counseled for almost 3 minutes, refused any nicotine replacements.  #7 acute renal failure-likely ATN from her sepsis and hypotension. -Monitor, avoid nephrotoxins.  #8 DVT prophylaxis-Ted's and SCDs  ER physician will check with other facilities to arrange transfer.  All the records are reviewed and case discussed with Consulting Daily Doe. Management plans discussed with the patient, family and  they are in agreement.  CODE STATUS: Full code  TOTAL CRITICAL CARE TIME SPENT IN TAKING CARE OF THIS PATIENT: 65 minutes.    Gladstone Lighter M.D on 10/10/2014 at 1:31 PM  Between 7am to 6pm - Pager - [phone]  After 6pm go to [email or web address] - password [email or web address] [facility]  Office  [phone]  CC: Primary care Physician: Donnie Coffin, MD

## 2014-10-10 NOTE — ED Notes (Signed)
Called [facility] transfer center spoke to [place] for transfer

## 2014-10-10 NOTE — ED Notes (Signed)
Titration stopped now at 34mcg/min with B/P of 93/77 (84)

## 2014-10-10 NOTE — Consult Note (Signed)
Patient ID: [name], female   DOB: [birth year], 59 y.o.   MRN: [id number] CC: ABDOMINAL PAIN HPI [name] is a 59 y.o. female who presents to the emergency department with a 3 day history of progressively worsening abdominal pain. She is known to the group for alcoholic liver disease with large volume ascites. She states that this pain is different than the pain she usually gets when she has too much fluid on board and is in need of a paracentesis. She has had multiple events requiring paracentesis in the past 6 months. She recounts at least 5 such times in that time period, with two of them in the past 3 weeks and having more fluid pulled off this morning by the emergency department. She had subjective fevers two days ago but none currently. She denies any nausea, vomiting, chest pain, shortness of breath, diarrhea or constipation. Her only complaint is abdominal pain. She states that when she has pain she takes ibuprofen, of which the last time was Tuesday (5 days ago). She recently had aldactone added to her chronic medications, but otherwise only takes lasix on a regular basis. Denies any other problems.  HPI  Past Medical History  Diagnosis Date  . Cirrhosis of liver   . Hemorrhoid     Past Surgical History  Procedure Laterality Date  . Tonsillectomy    . Appendectomy    . Colonoscopy  2006    Normal    Family History  Problem Relation Age of Onset  . Hypertension Mother   . Dementia Father   . Colon cancer Neg Hx   . Alcohol abuse Brother   . Alcohol abuse Brother   . Alcohol abuse Brother     Social History Social History  Substance Use Topics  . Smoking status: Current Every Day Smoker -- 0.50 packs/day for 40 years    Types: Cigarettes  . Smokeless tobacco: Never Used  . Alcohol Use: No     Comment: quit May 2016    Allergies  Allergen Reactions  . Penicillins Hives    Has patient had a PCN reaction causing immediate rash, facial/tongue/throat swelling,  SOB or lightheadedness with hypotension: Yes Has patient had a PCN reaction causing severe rash involving mucus membranes or skin necrosis: No Has patient had a PCN reaction that required hospitalization No Has patient had a PCN reaction occurring within the last 10 years: No If all of the above answers are "NO", then may proceed with Cephalosporin use.    No current facility-administered medications for this encounter.   Current Outpatient Prescriptions  Medication Sig Dispense Refill  . furosemide (LASIX) 20 MG tablet Take 1 tablet (20 mg total) by mouth 2 (two) times daily. 60 tablet 0  . spironolactone (ALDACTONE) 50 MG tablet Take 1 tablet (50 mg total) by mouth daily. 30 tablet 11  . albumin human 25 % bottle Inject 50 mLs (12.5 g total) into the vein once. Give 8 grams IV Albumin if greater than 5L of fluid pulled off, including first 5L 50 mL 0     Review of Systems A multi-point review of systems was completed. All pertinent positives and negatives were reported in the HPI, the remainder were negative.  Physical Exam Blood pressure 90/60, pulse 92, temperature 97.5 F (36.4 C), temperature source Oral, resp. rate 19, height 5\' 5"  (1.651 m), weight 61.236 kg (135 lb), SpO2 100 %. CONSTITUTIONAL: Laying in bed in no obvious distress. EYES: Pupils are equal, round, and  reactive to light,  EARS, NOSE, MOUTH AND THROAT: The oropharynx is clear. The oral mucosa is pink and moist. Hearing is intact to voice. LYMPH NODES:  Lymph nodes in the neck are normal. RESPIRATORY:  Lungs are distant but appear to be clear. There is normal respiratory effort, with equal breath sounds bilaterally, She appears to have to use her abdominal muscles for inspiratory effort at rest. CARDIOVASCULAR: Heart sounds are distant and tachycardic. No obvious murmurs. GI: The abdomen is very distended with tense ascites. Mildly tender to palpation in the midepigastric region, large but soft umbilical hernia that  appears to be fluid and gas containing. Obvious fluid wave and 4 quadrant tympany. No overt signs of peritonitis. GU: Rectal deferred.   MUSCULOSKELETAL: Normal muscle strength and tone. No cyanosis or edema.   SKIN: Turgor is good and there are no pathologic skin lesions or ulcers. NEUROLOGIC: Motor and sensation is grossly normal. Cranial nerves are grossly intact. PSYCH:  Oriented to person, place and time. Affect is normal.  Data Reviewed I have viewed the images and labs. Images consistent with small posterior perforation of a peptic ulcer given extraluminal contrast in the posterior gastric region and free air. Also with large volume ascites. Labs consistent with liver disease with an elevated PTT/INR and numerous electrolyte abnormalities and hypoalbuminemia I have personally reviewed the patient's imaging, laboratory findings and medical records.    Assessment    59 year old female with a perforated peptic ulcer in the setting of end stage liver disease     Plan    Discussed with the patient that her perforated peptic ulcer disease will need surgical intervention to treat, but that her end stage liver disease would complicate any procedure that would be done on her. Discussed that she would likely be in the hospital for numerous weeks while she recovers and that she would have massive fluid and electrolyte shifts during this time period that could be dangerous to her ability to survive. The surgery would first be to patch the opening in her stomach followed by wide drainage so that the incisions and repair could heal.  Discussed the case with critical care and anesthesia prior to making a final disposition. It is our opinion that she is a high surgical risk and is very likely to have a complicated post operative period likely requiring large volume cyrstaloid and colloid resuscitation. Given the anticipated complexities it is our opinion that she would be better cared for in an acedemic  tertiary care center with greater resources. Will work with the emergency department to better facilitate transfer from the ED so that the patient could benefit from a facility with more resources.     Time spent with the patient was 90 minutes, with more than 50% of the time spent in face-to-face education, counseling and care coordination.     Clayburn Pert 10/10/2014, 12:09 PM

## 2014-10-10 NOTE — ED Provider Notes (Addendum)
[facility] Emergency Department Provider Note  ____________________________________________  Time seen: 7:05 AM  I have reviewed the triage vital signs and the nursing notes.   HISTORY  Chief Complaint Abdominal Pain    HPI [name] is a 59 y.o. female who complains of gradual onset of progressively worsening severe diffuse abdominal pain. The pain is not radiating, worse with movement, no alleviating factors.The pain is worse in the bilateral lower quadrants. No recent trauma fevers chills nausea vomiting or diarrhea. She has a history of liver disease and ascites. She gets periodic therapeutic paracentesis by Dr. Allen Norris with gastroenterology. This was last performed 2 or 3 weeks ago according to the patient.  The patient also reports feeling very fatigued. Denies chest pain shortness of breath dizziness or syncope.   Past Medical History  Diagnosis Date  . Cirrhosis of liver   . Hemorrhoid      Patient Active Problem List   Diagnosis Date Noted  . Abdominal pain 10/10/2014  . 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 Anasarca 07/08/2014  . Hypokalemia 05/21/2014     Past Surgical History  Procedure Laterality Date  . Tonsillectomy    . Appendectomy    . Colonoscopy  2006    Normal     Current Outpatient Rx  Name  Route  Sig  Dispense  Refill  . furosemide (LASIX) 20 MG tablet   Oral   Take 1 tablet (20 mg total) by mouth 2 (two) times daily.   60 tablet   0   . spironolactone (ALDACTONE) 50 MG tablet   Oral   Take 1 tablet (50 mg total) by mouth daily.   30 tablet   11   . albumin human 25 % bottle   Intravenous   Inject 50 mLs (12.5 g total) into the vein once. Give 8 grams IV Albumin if greater than 5L of fluid pulled off, including first 5L   50 mL   0      Allergies Penicillins   Family History  Problem Relation Age of Onset  . Hypertension Mother   . Dementia Father   . Colon cancer Neg Hx    . Alcohol abuse Brother   . Alcohol abuse Brother   . Alcohol abuse Brother     Social History Social History  Substance Use Topics  . Smoking status: Current Every Day Smoker -- 0.50 packs/day for 40 years    Types: Cigarettes  . Smokeless tobacco: Never Used  . Alcohol Use: No     Comment: quit May 2016    Review of Systems  Constitutional:   No fever or chills. No weight changes Eyes:   No blurry vision or double vision.  ENT:   No sore throat. Cardiovascular:   No chest pain. Respiratory:   No dyspnea or cough. Gastrointestinal:    diffuse abdominal pain as above, without  vomiting and diarrhea.  No BRBPR or melena. Genitourinary:   Negative for dysuria, urinary retention, bloody urine, or difficulty urinating. Musculoskeletal:   Negative for back pain. No joint swelling or pain. Skin:   Negative for rash. Neurological:   Negative for headaches, focal weakness or numbness. Psychiatric:  No anxiety or depression.   Endocrine:  No hot/cold intolerance, changes in energy, or sleep difficulty.  10-point ROS otherwise negative.  ____________________________________________   PHYSICAL EXAM:  VITAL SIGNS: ED Triage Vitals  Enc Vitals Group     BP 10/10/14 0658 73/52 mmHg  Pulse Rate 10/10/14 0658 102     Resp 10/10/14 0658 16     Temp 10/10/14 0658 97.5 F (36.4 C)     Temp Source 10/10/14 0658 Oral     SpO2 10/10/14 0658 100 %     Weight 10/10/14 0658 135 lb (61.236 kg)     Height 10/10/14 0658 5\' 5"  (1.651 m)     Head Cir --      Peak Flow --      Pain Score 10/10/14 0702 10     Pain Loc --      Pain Edu? --      Excl. in [place]? --      Constitutional:   Alert and orieill-appearing, moderate distress due to pain Eyes:   Mild scleral icterus. No conjunctival pallor. PERRL. EOMI ENT   Head:   Normocephalic and atraumatic.   Nose:   No congestion/rhinnorhea. No septal hematoma   Mouth/Throat:   Dry mucous membranes, no pharyngeal erythema. No  peritonsillar mass. No uvula shift.   Neck:   No stridor. No SubQ emphysema. No meningismus. Hematological/Lymphatic/Immunilogical:   No cervical lymphadenopathy. Cardiovascular:   RRR. Normal and symmetric distal pulses are present in all extremities. No murmurs, rubs, or gallops. Respiratory:   Normal respiratory effort without tachypnea nor retractions. Breath sounds are clear and equal bilaterally. No wheezes/rales/rhonchi. Gastrointestinal:   Diffusely tender, markedly distended but not tense. There is rebound and guarding but no rigidity. Genitourinary:   deferred Musculoskeletal:   Nontender with normal range of motion in all extremities. No joint effusions.  No lower extremity tenderness.  No edema. Neurologic:   Normal speech and language.  CN 2-10 normal. Motor grossly intact. No pronator drift.  Normal gait. No gross focal neurologic deficits are appreciated.  Skin:    Skin is warm, dry and intact. No rash noted.  No petechiae, purpura, or bullae. Psychiatric:   Mood and affect are normal. Speech and behavior are normal. Patient exhibits appropriate insight and judgment.  ____________________________________________    LABS (pertinent positives/negatives) (all labs ordered are listed, but only abnormal results are displayed) Labs Reviewed  COMPREHENSIVE METABOLIC PANEL - Abnormal; Notable for the following:    Potassium 3.2 (*)    CO2 21 (*)    Glucose, Bld 125 (*)    BUN 23 (*)    Creatinine, Ser 1.65 (*)    Total Protein 6.2 (*)    Albumin 2.6 (*)    AST 60 (*)    ALT 6 (*)    Total Bilirubin 1.4 (*)    GFR calc non Af Amer 33 (*)    GFR calc Af Amer 38 (*)    All other components within normal limits  CBC WITH DIFFERENTIAL/PLATELET - Abnormal; Notable for the following:    Hemoglobin 9.8 (*)    HCT 31.9 (*)    MCV 75.9 (*)    MCH 23.3 (*)    MCHC 30.7 (*)    RDW 19.0 (*)    Lymphs Abs 0.4 (*)    All other components within normal limits  LACTIC ACID,  PLASMA - Abnormal; Notable for the following:    Lactic Acid, Venous 5.4 (*)    All other components within normal limits  PROTIME-INR - Abnormal; Notable for the following:    Prothrombin Time 15.8 (*)    All other components within normal limits  LACTATE DEHYDROGENASE, BODY FLUID - Abnormal; Notable for the following:    LD, Fluid 196 (*)  All other components within normal limits  BODY FLUID CULTURE  CULTURE, BLOOD (ROUTINE X 2)  CULTURE, BLOOD (ROUTINE X 2)  LIPASE, BLOOD  APTT  GLUCOSE, SEROUS FLUID  PROTEIN, BODY FLUID  LACTIC ACID, PLASMA  BODY FLUID CELL COUNT WITH DIFFERENTIAL  ALBUMIN, FLUID   ____________________________________________   EKG    ____________________________________________    RADIOLOGY    ____________________________________________   PROCEDURES CRITICAL CARE Performed by: Joni Fears, PHILLIP   Total critical care time: 90 minutes  Critical care time was exclusive of separately billable procedures and treating other patients.  Critical care was necessary to treat or prevent imminent or life-threatening deterioration.  Critical care was time spent personally by me on the following activities: development of treatment plan with patient and/or surrogate as well as nursing, discussions with consultants, evaluation of patient's response to treatment, examination of patient, obtaining history from patient or surrogate, ordering and performing treatments and interventions, ordering and review of laboratory studies, ordering and review of radiographic studies, pulse oximetry and re-evaluation of patient's condition.   PARACENTESIS   performed by me Dwana Curd Time 7:50 AM Verbal and written consent obtained from the patient, consent form on the chart. Indication: Suspected spontaneous bacterial peritonitis   The patient was placed in the supine position. Using ultrasound, I visualized the ascites fluid in all abdominal quadrants and  located the largest most favorable ascites fluid collection which was in the left lower quadrant. The area was marked and after retracting the skin to decrease the likelihood of a fluid leak after procedure, the soft tissues of the abdominal wall were anesthetized with local anesthetic lidocaine 1% with epinephrine. I then inserted an 18-gauge needle through the anesthetized area while the skin was still retracted, with easy aspiration of 30 MLS of green ascites fluid. There is no bleeding at the puncture site. The patient tolerated the procedure well. No complications. Estimated blood loss is 0  CENTRAL LINE Performed by: STAFFORD, PHILLIP at 11:30 AM Consent: The procedure was performed in an emergent situation. verbal and written consent was obtained from the patient.  Required items: required blood products, implants, devices, and special equipment available Patient identity confirmed: arm band and provided demographic data Time out: Immediately prior to procedure a "time out" was called to verify the correct patient, procedure, equipment, support staff and site/side marked as required. Indications: vascular access, vasopressors  Anesthesia: local infiltration Local anesthetic: lidocaine 1% without epinephrine Anesthetic total: 3 ml Patient sedated: no Preparation: skin prepped with 2% chlorhexidine Skin prep agent dried: skin prep agent completely dried prior to procedure Sterile barriers: all five maximum sterile barriers used - cap, mask, sterile gown, sterile gloves, and large full body sterile sheet Hand hygiene: hand hygiene performed prior to central venous catheter insertion  Location details: Right internal jugular vein   Catheter type: triple lumen Catheter size: 8 Fr Pre-procedure: landmarks identified Ultrasound guidance: Yes with continuous visualization throughout the venous access procedure  Successful placement: yes, one attempt  Post-procedure: line sutured and dressing  applied Assessment: blood return through all parts, free fluid flow, placement verified by x-ray and no pneumothorax on x-ray Patient tolerance: Patient tolerated the procedure well with no immediate complications. Estimated blood loss 2 mL ____________________________________________   INITIAL IMPRESSION / ASSESSMENT AND PLAN / ED COURSE  Pertinent labs & imaging results that were available during my care of the patient were reviewed by me and considered in my medical decision making (see chart for details).   Patient presents  with hypotension and exam concerning for peritonitis. We'll plan for a diagnostic paracentesis and give empiric ceftriaxone IV while also giving IV fluid boluses for resuscitation. We will also obtain a CT scan of the abdomen and pelvis for further workup. We will plan for admission.  ----------------------------------------- 8:26 AM on 10/10/2014 -----------------------------------------  Paracentesis completed without apparent complications. Lab workup is significant for a lactic acid level of 5.4. We'll check blood cultures and get a CT scan. Blood pressure back to patient stated baseline of about 80/50.  ----------------------------------------- 10:13 AM on 10/10/2014 -----------------------------------------  Labs significant for elevated serum lactate and elevated ascites LDH level which is consistent with SBP. CT still pending which will be followed up by the hospitalist. Discussed with hospitalist Dr. Tressia Miners at 10:15 AM. Patient has had some recurrent episodes of low blood pressure which will continue management with IV fluid boluses that she is fluid responsive.  ----------------------------------------- 12:32 PM on 10/10/2014 -----------------------------------------  CT result was obtained which I discussed with the radiologist. It is significant for a perforated gastric ulcer, most likely in the posterior gastric antrum. I discussed this with  surgery who evaluated the patient at the bedside in the ED. In discussion with surgery who himself discussed the case with anesthesiology and critical care, it is felt that this patient's needs will be beyond the abilities of this facility and that the patient will be better served and more safely cared for at a tertiary [facility]. Central venous catheter has been placed in the right IJ by knee, follow-up chest x-ray pending. We will start levophed for persistent hypotension with elevated lactate in the setting of this peritonitis.  ----------------------------------------- 2:28 PM on 10/10/2014 -----------------------------------------  Spoke with surgery resident [place] at 1:30 PM at [facility]. She stated she needed to discuss with her attending about bringing the patient to the surgical ICU at [facility], but then at 145 the transfer center called back saying that they had referred the case to the medical ICU. I then discussed the case with Dr. Olena Heckle of the [facility] medical ICU at 145 who will discuss with surgery to find the appropriate placement for the patient. Currently blood pressure is 93/77, mental status still intact, not tachycardic. We'll continue IV fluids and vasopressors.  ----------------------------------------- 3:30 PM on 10/10/2014 -----------------------------------------  Receive follow-up call from [facility] stating that make you had referred the case back to surgery and would not accept and less surgery indicated that the patient was nonoperative. Transfer center then also reported that the surgery team felt they could not make that decision without reviewing the CT images. As this already has represented to delay in care and getting the patient transferred to an appropriate level of care and that I don't have a reliable way of communicating the CT images to the [facility] surgery team without risking the patient's HIPAA rights, we contacted the [facility] transfer center. I discussed  the case with the intensivist Dr. Bearl Mulberry at 3:25 PM of [facility] who will accept. Have also paged [place] surgery to evaluate the imaging and consideration of surgical repair of the perforated gastric ulcer. We continue IV fluids and vasopressors in the ED and I have added on IV Flagyl for anaerobic coverage as well.  ----------------------------------------- 3:44 PM on 10/10/2014 -----------------------------------------  Discussed with [facility] at 3:40 PM who indicates that they will likely be noted perform a surgical repair of the perforated gastric ulcer.  We will continue abx, ivf, and levophed  while  awaiting transport to the [facility] ICU. ____________________________________________   FINAL CLINICAL IMPRESSION(S) / ED DIAGNOSES  Final diagnoses:  Spontaneous bacterial peritonitis  Generalized abdominal pain  Ascites      Carrie Mew, MD 10/10/14 1015  Carrie Mew, MD 10/10/14 [phone]

## 2014-10-10 NOTE — ED Notes (Signed)
CRITICAL VALUE ALERT  Critical value received:  5.4 lactic  Date of notification:  10/10/2014  Time of notification:  5400  Critical value read back:Yes.    Nurse who received alert:  Waymon Amato  MD notified (1st page):  [place]  Time of first page:  [phone]  MD notified (2nd page):  Time of second page:  Responding MD:  [place]  Time MD responded:  [phone]

## 2014-10-10 NOTE — ED Notes (Signed)
Called [facility] transfer center spoke to [place] for transfer

## 2014-10-10 NOTE — Consult Note (Signed)
ANTIBIOTIC CONSULT NOTE - INITIAL  Pharmacy Consult for vancomycin Indication: peritonitis  Allergies  Allergen Reactions  . Penicillins Hives    Has patient had a PCN reaction causing immediate rash, facial/tongue/throat swelling, SOB or lightheadedness with hypotension: Yes Has patient had a PCN reaction causing severe rash involving mucus membranes or skin necrosis: No Has patient had a PCN reaction that required hospitalization No Has patient had a PCN reaction occurring within the last 10 years: No If all of the above answers are "NO", then may proceed with Cephalosporin use.    Patient Measurements: Height: 5\' 5"  (165.1 cm) Weight: 136 lb 14.4 oz (62.097 kg) IBW/kg (Calculated) : 57  Vital Signs: Temp: 98.1 F (36.7 C) (09/25 1656) BP: 101/60 mmHg (09/25 1800) Pulse Rate: 92 (09/25 1800) Intake/Output from previous day:   Intake/Output from this shift:    Labs:  Recent Labs  10/10/14 0710  WBC 4.8  HGB 9.8*  PLT 206  CREATININE 1.65*   Estimated Creatinine Clearance: 32.6 mL/min (by C-G formula based on Cr of 1.65). No results for input(s): VANCOTROUGH, VANCOPEAK, VANCORANDOM, GENTTROUGH, GENTPEAK, GENTRANDOM, TOBRATROUGH, TOBRAPEAK, TOBRARND, AMIKACINPEAK, AMIKACINTROU, AMIKACIN in the last 72 hours.   Microbiology: No results found for this or any previous visit (from the past 720 hour(s)).  Medical History: Past Medical History  Diagnosis Date  . Cirrhosis of liver   . Hemorrhoid    Assessment: 59 yo female presents with ascites and abdominal pain. Recently had 10 L of fluid removed.   PMH: alcoholic cirrhosis  ID: abx for peritonitis. CT shows perforated gastric ulcer and peritonitis. WBC 4.8, Lactate 5.4>>2.8, AF  Aztreonam 9/25>> Vancomycin 9/25>> Metronidazole 9/25>>  9/25 Urine 9/25 MRSA PCR 9/25 Blood x 2  Renal: SCr 1.65, CrCl ~ 32  Goal of Therapy:  Vancomycin trough level 15-20 mcg/ml  Plan:  Vancomycin 1500 mg x 1 then 1000  mg q24h Aztreonam 2 g q12h Metronidazole 500 mg q8h Monitor clinical status, renal fx, cultures, and VT prn  Levester Fresh, PharmD, BCPS Clinical Pharmacist Pager [phone] 10/10/2014 7:01 PM

## 2014-10-10 NOTE — ED Notes (Signed)
Patient transported to CT 

## 2014-10-10 NOTE — H&P (Signed)
PULMONARY / CRITICAL CARE MEDICINE   Name: [name] MRN: [id number] DOB: [birth year]    ADMISSION DATE:  10/10/2014  REFERRING MD :  Selena Lesser   CHIEF COMPLAINT:  Bowel perforation   INITIAL PRESENTATION: 59yo female smoker with hx ETOH cirrhosis with worsening ascites requiring more frequent paracentesis with most recent on 9/15 with 10.5L removed.  She presented to [facility] hospital 9/25 with 2 day hx worsening abd pain and increased abd distension. She was hypotensive requiring pressors and CT abd showed free air.  She was tx to [facility] 9/25 for surgical intervention and ICU mgmt.   STUDIES:  CT abd/pelvis 9/25>>> Extraluminal contrast along the distal stomach/proximal duodenum, possibly secondary to a perforated gastric ulcer along the posterior gastric antrum.   Associated moderate pneumoperitoneum.   Large volume abdominopelvic ascites.  SIGNIFICANT EVENTS: 9/25 tx [facility], to OR for exp lap    HISTORY OF PRESENT ILLNESS:  59yo female with hx ETOH cirrhosis with worsening ascites requiring more frequent paracentesis with most recent on 9/15 with 10.5L removed.  She presented to [facility] hospital 9/25 with 2 day hx worsening abd pain and increased abd distension. She was hypotensive requiring pressors and CT abd showed free air.  She was tx to [facility] 9/25 for surgical intervention and ICU mgmt.   States she ate some chinese food on 9/24 and it "did not settle well".  Abd pain has been progressive since and feels much different than the discomfort from her ascites.   She denies n/v/d, melena, hematemesis, chest pain, SOB.    PAST MEDICAL HISTORY :   has a past medical history of Cirrhosis of liver and Hemorrhoid.  has past surgical history that includes Tonsillectomy; Appendectomy; Colonoscopy (2006); and Excisional hemorrhoidectomy. Prior to Admission medications   Medication Sig Start Date End Date Taking? Authorizing Provider  albumin human 25 % bottle Inject 50 mLs (12.5 g total)  into the vein once. Give 8 grams IV Albumin if greater than 5L of fluid pulled off, including first 5L 09/29/14   Lucilla Lame, MD  furosemide (LASIX) 20 MG tablet Take 1 tablet (20 mg total) by mouth 2 (two) times daily. 05/25/14   Dustin Flock, MD  spironolactone (ALDACTONE) 50 MG tablet Take 1 tablet (50 mg total) by mouth daily. 09/16/14   Lucilla Lame, MD   Allergies  Allergen Reactions  . Penicillins Hives    Has patient had a PCN reaction causing immediate rash, facial/tongue/throat swelling, SOB or lightheadedness with hypotension: Yes Has patient had a PCN reaction causing severe rash involving mucus membranes or skin necrosis: No Has patient had a PCN reaction that required hospitalization No Has patient had a PCN reaction occurring within the last 10 years: No If all of the above answers are "NO", then may proceed with Cephalosporin use.    FAMILY HISTORY:  indicated that her mother is alive. She indicated that her father is deceased.  SOCIAL HISTORY:  reports that she has been smoking Cigarettes.  She has a 20 pack-year smoking history. She has never used smokeless tobacco. She reports that she does not drink alcohol or use illicit drugs.  REVIEW OF SYSTEMS:  As per HPI - All other systems reviewed and were neg.    SUBJECTIVE:   VITAL SIGNS: Temp:  [97.5 F (36.4 C)-98.1 F (36.7 C)] 98.1 F (36.7 C) (09/25 1656) Pulse Rate:  [40-102] 43 (09/25 1945) Resp:  [11-26] 15 (09/25 1945) BP: (66-102)/(47-77) 100/57 mmHg (09/25 1945) SpO2:  [94 %-100 %]  95 % (09/25 1945) Weight:  [135 lb (61.236 kg)-136 lb 14.4 oz (62.097 kg)] 136 lb 14.4 oz (62.097 kg) (09/25 1800) HEMODYNAMICS:   VENTILATOR SETTINGS:   INTAKE / OUTPUT:  Intake/Output Summary (Last 24 hours) at 10/10/14 2037 Last data filed at 10/10/14 1900  Gross per 24 hour  Intake     93 ml  Output     40 ml  Net     53 ml    PHYSICAL EXAMINATION: General:  Uncomfortable appearing female, NAD  Neuro:  Awake,  alert, appropriate, MAE HEENT:  Mm dry, no JVD  Cardiovascular:  s1s2 rrr Lungs:  resps even non labored on RA Abdomen:  Significant abd distension, tender diffusely, +gaurding, sig ascites  Musculoskeletal:  Warm and dry, no edema e  LABS:  CBC  Recent Labs Lab 10/10/14 0710 10/10/14 1854  WBC 4.8 18.7*  HGB 9.8* 8.5*  HCT 31.9* 29.3*  PLT 206 394   Coag's  Recent Labs Lab 10/10/14 0710 10/10/14 1854  APTT 31 35  INR 1.24 1.49   BMET  Recent Labs Lab 10/10/14 0710 10/10/14 1854  NA 137 133*  K 3.2* 3.0*  CL 102 103  CO2 21* 17*  BUN 23* 24*  CREATININE 1.65* 1.80*  GLUCOSE 125* 111*   Electrolytes  Recent Labs Lab 10/10/14 0710 10/10/14 1854  CALCIUM 8.9 7.5*  MG  --  1.5*  PHOS  --  5.8*   Sepsis Markers  Recent Labs Lab 10/10/14 0710 10/10/14 1234 10/10/14 1534 10/10/14 1854  LATICACIDVEN 5.4* 3.0* 2.8*  --   PROCALCITON  --   --   --  25.82   ABG  Recent Labs Lab 10/10/14 1909  PHART 7.334*  PCO2ART 29.2*  PO2ART 62.0*   Liver Enzymes  Recent Labs Lab 10/10/14 0710 10/10/14 1854  AST 60* 31  ALT 6* 9*  ALKPHOS 97 68  BILITOT 1.4* 0.9  ALBUMIN 2.6* 1.9*   Cardiac Enzymes  Recent Labs Lab 10/10/14 1854  TROPONINI <0.03   Glucose  Recent Labs Lab 10/10/14 1847  GLUCAP 103*    Imaging Ct Abdomen Pelvis Wo Contrast  10/10/2014   CLINICAL DATA:  Decrease abdominal pain, liver disease, ascites  EXAM: CT ABDOMEN AND PELVIS WITHOUT CONTRAST  TECHNIQUE: Multidetector CT imaging of the abdomen and pelvis was performed following the standard protocol without IV contrast.  COMPARISON:  Right upper quadrant ultrasound dated 05/21/2014  FINDINGS: Lower chest: Trace bilateral pleural effusions. Mild dependent atelectasis the lung bases.  Hepatobiliary: Cirrhotic configuration of the liver.  Gallbladder is unremarkable. No intrahepatic or extrahepatic ductal dilatation.  Pancreas: Within normal limits.  Spleen: Splenomegaly.   Adrenals/Urinary Tract: Adrenal glands are within normal limits.  Right kidney is notable for three nonobstructing right renal calculi measuring up to 4 mm. 1.4 cm lateral right upper pole renal cyst (series 2/ image 44).  4 mm posterior interpolar left renal cyst (series 2/ image 41).  No hydronephrosis.  Bladder is underdistended but unremarkable.  Stomach/Bowel: Contrast within the stomach. Associated extraluminal contrast in the right mid abdomen (series 2/images 26 and 29), associated with the distal gastric antrum and proximal duodenum.  Focal contrast along the posterior aspect of the gastric antrum suggest a perforated gastric ulcer (series 2/image 33).  Scattered foci of free air with additional moderate pneumoperitoneum in the anterior abdomen (series 2/ images 41 and 62).  No evidence of bowel obstruction.  Appendix is not discretely visualized.  Mild wall thickening involving the right  colon (series 2/ image 65), possibly secondary to ascites, infectious/ inflammatory colitis considered less likely but not excluded.  Colonic diverticulosis, without evidence of diverticulitis.  Vascular/Lymphatic: Atherosclerotic calcifications of the abdominal aorta and branch vessels.  No suspicious abdominopelvic lymphadenopathy.  Reproductive: Uterus is notable for multiple uterine fibroids, including a 4.6 cm calcified subserosal fundal fibroid (series 2/image 78).  No adnexal masses.  Other: Large volume abdominopelvic ascites.  Musculoskeletal: Degenerative changes of the visualized thoracolumbar spine, most prominent at L4-5.  IMPRESSION: Extraluminal contrast along the distal stomach/proximal duodenum, possibly secondary to a perforated gastric ulcer along the posterior gastric antrum.  Associated moderate pneumoperitoneum.  Large volume abdominopelvic ascites.  Additional ancillary findings as above.  Critical Value/emergent results were called by telephone at the time of interpretation on 10/10/2014 at 11:00 am  to Dr. Carrie Mew, who verbally acknowledged these results.   Electronically Signed   By: Julian Hy M.D.   On: 10/10/2014 11:01   Dg Chest Portable 1 View  10/10/2014   CLINICAL DATA:  Placement of right internal jugular line.  EXAM: PORTABLE CHEST 1 VIEW  COMPARISON:  None.  FINDINGS: Right internal jugular central line is in place with the tip in the lower right atrium or possibly into the IVC. This is approximately 10 cm below the cavoatrial junction. Low lung volumes. Minimal bibasilar atelectasis. Heart is normal size. No acute bony abnormality.  IMPRESSION: Right internal jugular central line passes were well into the right atrium or possibly through the heart into the IVC, approximately 10 cm below the cavoatrial junction.  Low lung volumes, bibasilar atelectasis.   Electronically Signed   By: Rolm Baptise M.D.   On: 10/10/2014 13:07     ASSESSMENT / PLAN:  PULMONARY Tobacco abuse  P:   Anticipate she will come out of OR on vent  PRN BD's  F/u CXR  Smoking cessation   CARDIOVASCULAR CVL R IJ CVL ([place]) 9/25>>> Septic shock  P:  Continue levophed and titrate as able for goal MAP >65 F/u lactate, pct  Gentle volume  Resume diuretics when able  Check CVP   RENAL Hyponatremia - mild  Hypokalemia  AKI  P:   F/u chem, mg, phos in am  Replete K PRN  F/u lactate   GASTROINTESTINAL ETOH cirrhosis  Free air - presume ruptured ulcer  Ascites  P:   To OR per surgery  abx as below   HEMATOLOGIC Anemia  Leukocytosis  P:  F/u CBC  SCD's   INFECTIOUS Intra-abdominal sepsis PCN allergy  P:   BCx2 9/25>>> UC 9/25>>>  Vanc 9/25>>> Aztreonam 9/25>>> Flagyl 9/25>>>  ENDOCRINE Hyperglycemia - mild    P:   Monitor glucose on chem   NEUROLOGIC No active issue P:   Pain control post op    FAMILY  - Updates:   No family available, pt updated at length 9/25    Nickolas Madrid, NP 10/10/2014  8:37 PM Pager: [phone] or [phone]  [phone]

## 2014-10-10 NOTE — ED Notes (Signed)
Pt to room 25 via EMS from home.  Pt reports yesterday ate chinese and lower abd pain since, getting worse over time, described as burning.  Pt Hx of liver problems and fluid retention that needs to be drained.  PT states this is not related.  Pt jaundice in skin color.  Denies n/v/d.  Pt NAD upon arrival.

## 2014-10-10 NOTE — Anesthesia Preprocedure Evaluation (Addendum)
Anesthesia Evaluation  Patient identified by MRN, date of birth, ID band Patient awake    Reviewed: Allergy & Precautions, NPO status , Patient's Chart, lab work & pertinent test results  Airway Mallampati: II   Neck ROM: full    Dental   Pulmonary Current Smoker,    breath sounds clear to auscultation       Cardiovascular negative cardio ROS   Rhythm:regular Rate:Normal     Neuro/Psych    GI/Hepatic (+) Cirrhosis   ascites  substance abuse  alcohol use, Gastric ulcer.   Endo/Other    Renal/GU Renal InsufficiencyRenal disease     Musculoskeletal   Abdominal   Peds  Hematology   Anesthesia Other Findings   Reproductive/Obstetrics                            Anesthesia Physical Anesthesia Plan  ASA: III and emergent  Anesthesia Plan: General   Post-op Pain Management:    Induction: Intravenous, Rapid sequence and Cricoid pressure planned  Airway Management Planned: Oral ETT  Additional Equipment:   Intra-op Plan:   Post-operative Plan: Extubation in OR and Possible Post-op intubation/ventilation  Informed Consent: I have reviewed the patients History and Physical, chart, labs and discussed the procedure including the risks, benefits and alternatives for the proposed anesthesia with the patient or authorized representative who has indicated his/her understanding and acceptance.     Plan Discussed with: CRNA, Anesthesiologist and Surgeon  Anesthesia Plan Comments:        Anesthesia Quick Evaluation

## 2014-10-10 NOTE — Progress Notes (Signed)
[place] Progress Note Patient Name: [name] DOB: May 29, 1955 MRN: [id number]   Date of Service  10/10/2014  HPI/Events of Note  Multiple issues: 1. Ventilated patient - needs stress ulcer prophylaxis and 2. Ventilated patient - need sedation.   eICU Interventions  Will order: 1. Propofol IV infusion. Titrate to RASS = -2 to -3. 2. Protonix IV.     Intervention Category Intermediate Interventions: Best-practice therapies (e.g. DVT, beta blocker, etc.) Minor Interventions: Agitation / anxiety - evaluation and management  Sommer,Steven Eugene 10/10/2014, 11:28 PM

## 2014-10-10 NOTE — Progress Notes (Signed)
CRITICAL VALUE ALERT  Critical value received: Lactic Acid 3.9  Date of notification:  10/10/14   Time of notification: 2123  Critical value read back: yes  Nurse who received alert:  Delphia Grates RN/Xochilt Conant Foy [place], RN  MD notified (1st page):  Dr. Vaughan Browner  Time of first page:  2123

## 2014-10-10 NOTE — ED Notes (Signed)
Patient is now titrating at 30 mcg/min with a b/p 82/48 (60)

## 2014-10-10 NOTE — ED Notes (Signed)
Called [facility] for Transfer to [facility], [facility] refused transfer  [phone]

## 2014-10-10 NOTE — ED Notes (Signed)
Called [facility] transfer center spoke to [place] for transfer

## 2014-10-10 NOTE — Anesthesia Procedure Notes (Signed)
Procedure Name: Intubation Date/Time: 10/10/2014 9:31 PM Performed by: Eligha Bridegroom Pre-anesthesia Checklist: Emergency Drugs available, Timeout performed, Patient identified, Suction available and Patient being monitored Patient Re-evaluated:Patient Re-evaluated prior to inductionOxygen Delivery Method: Circle system utilized Preoxygenation: Pre-oxygenation with 100% oxygen Intubation Type: IV induction, Rapid sequence and Cricoid Pressure applied Laryngoscope Size: Mac and 3 Grade View: Grade I Tube type: Oral Tube size: 7.0 mm Number of attempts: 1 Airway Equipment and Method: Stylet and LTA kit utilized Placement Confirmation: ETT inserted through vocal cords under direct vision,  breath sounds checked- equal and bilateral and positive ETCO2 Secured at: 21 cm Tube secured with: Tape Dental Injury: Teeth and Oropharynx as per pre-operative assessment

## 2014-10-10 NOTE — Consult Note (Signed)
Reason for Consult:perforated ulcer Referring Physician: [name] is an 59 y.o. female.  HPI:   Patient is a 59 year old female who went to [facility] emergency department with an increase in abdominal pain. She has a history of cirrhosis secondary to alcohol and has had ascites drained multiple times this summer.  The pain was definitely different than the last time. She has been taking ibuprofen on occasion, but none in 5 days. She takes Lasix and Aldactone as her only medications.  She denies n/v/d/c/shortness of breath.  She denies change in bowel habits.  She had two paracenteses in the last 2 weeks.  [facility] and [facility] were reportedly full and did not have ICU beds.    Past Medical History  Diagnosis Date  . Cirrhosis of liver   . Hemorrhoid     Past Surgical History  Procedure Laterality Date  . Tonsillectomy    . Appendectomy    . Colonoscopy  2006    Normal  . Excisional hemorrhoidectomy      Family History  Problem Relation Age of Onset  . Hypertension Mother   . Dementia Father   . Colon cancer Neg Hx   . Alcohol abuse Brother   . Alcohol abuse Brother   . Alcohol abuse Brother     Social History:  reports that she has been smoking Cigarettes.  She has a 20 pack-year smoking history. She has never used smokeless tobacco. She reports that she does not drink alcohol or use illicit drugs.  Allergies:  Allergies  Allergen Reactions  . Penicillins Hives    Has patient had a PCN reaction causing immediate rash, facial/tongue/throat swelling, SOB or lightheadedness with hypotension: Yes Has patient had a PCN reaction causing severe rash involving mucus membranes or skin necrosis: No Has patient had a PCN reaction that required hospitalization No Has patient had a PCN reaction occurring within the last 10 years: No If all of the above answers are "NO", then may proceed with Cephalosporin use.    Medications: lasix, aldactone  Results  for orders placed or performed during the hospital encounter of 10/10/14 (from the past 48 hour(s))  Glucose, capillary     Status: Abnormal   Collection Time: 10/10/14  6:47 PM  Result Value Ref Range   Glucose-Capillary 103 (H) 65 - 99 mg/dL  I-STAT 3, arterial blood gas (G3+)     Status: Abnormal   Collection Time: 10/10/14  7:09 PM  Result Value Ref Range   pH, Arterial 7.334 (L) 7.350 - 7.450   pCO2 arterial 29.2 (L) 35.0 - 45.0 mmHg   pO2, Arterial 62.0 (L) 80.0 - 100.0 mmHg   Bicarbonate 15.5 (L) 20.0 - 24.0 mEq/L   TCO2 16 0 - 100 mmol/L   O2 Saturation 90.0 %   Acid-base deficit 9.0 (H) 0.0 - 2.0 mmol/L   Patient temperature HIDE    Sample type ARTERIAL     Ct Abdomen Pelvis Wo Contrast  10/10/2014   CLINICAL DATA:  Decrease abdominal pain, liver disease, ascites  EXAM: CT ABDOMEN AND PELVIS WITHOUT CONTRAST  TECHNIQUE: Multidetector CT imaging of the abdomen and pelvis was performed following the standard protocol without IV contrast.  COMPARISON:  Right upper quadrant ultrasound dated 05/21/2014  FINDINGS: Lower chest: Trace bilateral pleural effusions. Mild dependent atelectasis the lung bases.  Hepatobiliary: Cirrhotic configuration of the liver.  Gallbladder is unremarkable. No intrahepatic or extrahepatic ductal dilatation.  Pancreas: Within normal limits.  Spleen: Splenomegaly.  Adrenals/Urinary Tract: Adrenal glands are within normal limits.  Right kidney is notable for three nonobstructing right renal calculi measuring up to 4 mm. 1.4 cm lateral right upper pole renal cyst (series 2/ image 44).  4 mm posterior interpolar left renal cyst (series 2/ image 41).  No hydronephrosis.  Bladder is underdistended but unremarkable.  Stomach/Bowel: Contrast within the stomach. Associated extraluminal contrast in the right mid abdomen (series 2/images 26 and 29), associated with the distal gastric antrum and proximal duodenum.  Focal contrast along the posterior aspect of the gastric antrum  suggest a perforated gastric ulcer (series 2/image 33).  Scattered foci of free air with additional moderate pneumoperitoneum in the anterior abdomen (series 2/ images 41 and 62).  No evidence of bowel obstruction.  Appendix is not discretely visualized.  Mild wall thickening involving the right colon (series 2/ image 65), possibly secondary to ascites, infectious/ inflammatory colitis considered less likely but not excluded.  Colonic diverticulosis, without evidence of diverticulitis.  Vascular/Lymphatic: Atherosclerotic calcifications of the abdominal aorta and branch vessels.  No suspicious abdominopelvic lymphadenopathy.  Reproductive: Uterus is notable for multiple uterine fibroids, including a 4.6 cm calcified subserosal fundal fibroid (series 2/image 78).  No adnexal masses.  Other: Large volume abdominopelvic ascites.  Musculoskeletal: Degenerative changes of the visualized thoracolumbar spine, most prominent at L4-5.  IMPRESSION: Extraluminal contrast along the distal stomach/proximal duodenum, possibly secondary to a perforated gastric ulcer along the posterior gastric antrum.  Associated moderate pneumoperitoneum.  Large volume abdominopelvic ascites.  Additional ancillary findings as above.  Critical Value/emergent results were called by telephone at the time of interpretation on 10/10/2014 at 11:00 am to Dr. Carrie Mew, who verbally acknowledged these results.   Electronically Signed   By: Julian Hy M.D.   On: 10/10/2014 11:01   Dg Chest Portable 1 View  10/10/2014   CLINICAL DATA:  Placement of right internal jugular line.  EXAM: PORTABLE CHEST 1 VIEW  COMPARISON:  None.  FINDINGS: Right internal jugular central line is in place with the tip in the lower right atrium or possibly into the IVC. This is approximately 10 cm below the cavoatrial junction. Low lung volumes. Minimal bibasilar atelectasis. Heart is normal size. No acute bony abnormality.  IMPRESSION: Right internal jugular  central line passes were well into the right atrium or possibly through the heart into the IVC, approximately 10 cm below the cavoatrial junction.  Low lung volumes, bibasilar atelectasis.   Electronically Signed   By: Rolm Baptise M.D.   On: 10/10/2014 13:07    Review of Systems  HENT: Negative.   Eyes: Negative.   Respiratory: Negative.   Cardiovascular: Negative.   Gastrointestinal: Positive for abdominal pain.  Genitourinary: Positive for frequency (secondary to diuretics.  ).  Musculoskeletal: Negative.   Skin: Negative.   Neurological: Positive for dizziness and weakness.  Endo/Heme/Allergies: Negative.   Psychiatric/Behavioral: Positive for substance abuse (history).   Blood pressure 102/59, pulse 89, resp. rate 18, height 5\' 5"  (1.651 m), weight 62.097 kg (136 lb 14.4 oz), SpO2 94 %. Physical Exam  Constitutional: She is oriented to person, place, and time. She appears well-developed and well-nourished. She appears distressed.  HENT:  Head: Normocephalic and atraumatic.  Eyes: Conjunctivae are normal. No scleral icterus.  Neck: Normal range of motion. Neck supple. No thyromegaly present.  Cardiovascular: Normal rate and intact distal pulses.   Respiratory: Effort normal. No respiratory distress. She has no wheezes. She has no rales. She exhibits no tenderness.  GI:  Soft. She exhibits distension. There is tenderness. There is rebound and guarding.  + fluid wave.  Musculoskeletal: Normal range of motion.  Neurological: She is alert and oriented to person, place, and time.  Skin: Skin is dry.  cool  Psychiatric: She has a normal mood and affect. Her behavior is normal. Thought content normal.    Assessment/Plan: Free air.  Presumed ulcer based on scan findings and history Cirrhosis Sepsis/septic shock presumed from abdomen. Metabolic acidosis  Supportive care for cirrhosis Plan exploratory laparotomy with repair of ulcer.  Discussed with patient that we may have  alternative findings that may require alternative procedure.   Also discussed high likelihood of wound complications secondary to ascites.  High risk for hernia and wound dehiscence.  Would plan to leave skin open.   Also reviewed mortality from emergency surgery in the next 30-90 days is around 30+%.  This is in addition to other complications of infection, damage to adjacent structures, heart or lung issues, kidney failure, bleeding, possible need for additional surgeries or procedures.     MELD 17, Candis Shine 10/10/2014, 7:41 PM

## 2014-10-10 NOTE — ED Notes (Signed)
MD at bedside. Placing central line 

## 2014-10-10 NOTE — Op Note (Signed)
PRE-OPERATIVE DIAGNOSIS: perforated gastric ulcer, cirrhosis with ascites  POST-OPERATIVE DIAGNOSIS:  Same  PROCEDURE:  Procedure(s): Exploratory laparotomy, graham patch repair of pre pyloric gastric ulcer, primary umbilical repair (not part of the surgical incision)  SURGEON:  Surgeon(s): Stark Klein, MD  ASSISTANT: Surgeon Neysa Bonito, MD  ANESTHESIA:   general  DRAINS: none   LOCAL MEDICATIONS USED:  NONE  SPECIMEN:  No Specimen  DISPOSITION OF SPECIMEN:  N/A  COUNTS:  YES  DICTATION: .Dragon Dictation  PLAN OF CARE: Admit to inpatient   PATIENT DISPOSITION:  PACU - guarded condition.  FINDINGS:  9.2 L of bilious ascites, perforated prepyloric ulcer.  Fibroid uterus.  Nodular cirrhosis.    EBL: min  PROCEDURE:  Patient was identified in the holding area, then taken to the OR where she was placed on the operating room table in the supine position.  She was intubated, then a foley catheter was placed, and her abdomen was prepped and draped in sterile fashion.  A timeout was performed according to the surgical safety checklist.  When all was correct, we contin  A midline incision was made in the upper abdomen with the #10 blade.  The peritoneum was entered sharply and the ascites was suctioned out.  9.2 L was removed.  The peritoneum was then opened the length of the incision.  The ulcer was immediately seen anteriorly in the prepyloric location.  A nice tongue of omentum was immediately available. 2-0 silk sutures were placed on either side of the defect.  The omentum was placed over the defect and under the sutures.  This was then tied down with the sutures.    The abdomen was then copiously irrigated with 13 L of saline until the irrigant returned clear.  The umbilical hernia was able to be reached from the upper incision.  This was closed with interrupted #1 PDS suture.  The fascial incision was then closed with running #1 PDS and #1 novofil interrupted internal  retention sutures.  The skin was left open and packed with betadine soaked kerlix.  Sterile soft dressings were applied.    Needle, sponge, and instrument counts are correct times 2.

## 2014-10-11 ENCOUNTER — Encounter (HOSPITAL_COMMUNITY): Payer: Self-pay | Admitting: General Surgery

## 2014-10-11 DIAGNOSIS — N179 Acute kidney failure, unspecified: Secondary | ICD-10-CM

## 2014-10-11 DIAGNOSIS — R6521 Severe sepsis with septic shock: Secondary | ICD-10-CM

## 2014-10-11 DIAGNOSIS — A419 Sepsis, unspecified organism: Principal | ICD-10-CM

## 2014-10-11 DIAGNOSIS — K7031 Alcoholic cirrhosis of liver with ascites: Secondary | ICD-10-CM

## 2014-10-11 DIAGNOSIS — K631 Perforation of intestine (nontraumatic): Secondary | ICD-10-CM

## 2014-10-11 LAB — BASIC METABOLIC PANEL
ANION GAP: 10 (ref 5–15)
Anion gap: 8 (ref 5–15)
BUN: 24 mg/dL — ABNORMAL HIGH (ref 6–20)
BUN: 24 mg/dL — AB (ref 6–20)
CALCIUM: 6.1 mg/dL — AB (ref 8.9–10.3)
CALCIUM: 6.7 mg/dL — AB (ref 8.9–10.3)
CO2: 16 mmol/L — ABNORMAL LOW (ref 22–32)
CO2: 14 mmol/L — AB (ref 22–32)
Chloride: 108 mmol/L (ref 101–111)
Chloride: 113 mmol/L — ABNORMAL HIGH (ref 101–111)
Creatinine, Ser: 1.72 mg/dL — ABNORMAL HIGH (ref 0.44–1.00)
Creatinine, Ser: 1.74 mg/dL — ABNORMAL HIGH (ref 0.44–1.00)
GFR calc Af Amer: 36 mL/min — ABNORMAL LOW (ref >60)
GFR, EST AFRICAN AMERICAN: 36 mL/min — AB (ref >60)
GFR, EST NON AFRICAN AMERICAN: 31 mL/min — AB (ref >60)
GFR, EST NON AFRICAN AMERICAN: 31 mL/min — AB (ref >60)
GLUCOSE: 103 mg/dL — AB (ref 65–99)
GLUCOSE: 118 mg/dL — AB (ref 65–99)
POTASSIUM: 3.8 mmol/L (ref 3.5–5.1)
Potassium: 3.7 mmol/L (ref 3.5–5.1)
Sodium: 134 mmol/L — ABNORMAL LOW (ref 135–145)
Sodium: 135 mmol/L (ref 135–145)

## 2014-10-11 LAB — URINE CULTURE: Culture: NO GROWTH

## 2014-10-11 LAB — BLOOD GAS, ARTERIAL
Acid-base deficit: 13.1 mmol/L — ABNORMAL HIGH (ref 0.0–2.0)
BICARBONATE: 12.4 meq/L — AB (ref 20.0–24.0)
Drawn by: 419771
FIO2: 0.4
O2 Saturation: 99.2 %
PATIENT TEMPERATURE: 98.6
PEEP: 5 cmH2O
PO2 ART: 148 mmHg — AB (ref 80.0–100.0)
RATE: 15 resp/min
TCO2: 13.3 mmol/L (ref 0–100)
VT: 460 mL
pCO2 arterial: 27.9 mmHg — ABNORMAL LOW (ref 35.0–45.0)
pH, Arterial: 7.271 — ABNORMAL LOW (ref 7.350–7.450)

## 2014-10-11 LAB — PROTIME-INR
INR: 1.95 — ABNORMAL HIGH (ref 0.00–1.49)
Prothrombin Time: 22.1 seconds — ABNORMAL HIGH (ref 11.6–15.2)

## 2014-10-11 LAB — HEMOGLOBIN AND HEMATOCRIT, BLOOD
HEMATOCRIT: 26.7 % — AB (ref 36.0–46.0)
Hemoglobin: 7.7 g/dL — ABNORMAL LOW (ref 12.0–15.0)

## 2014-10-11 LAB — POCT I-STAT 3, ART BLOOD GAS (G3+)
Acid-base deficit: 8 mmol/L — ABNORMAL HIGH (ref 0.0–2.0)
Bicarbonate: 18.7 mEq/L — ABNORMAL LOW (ref 20.0–24.0)
O2 Saturation: 100 %
TCO2: 20 mmol/L (ref 0–100)
pCO2 arterial: 41.3 mmHg (ref 35.0–45.0)
pH, Arterial: 7.264 — ABNORMAL LOW (ref 7.350–7.450)
pO2, Arterial: 433 mmHg — ABNORMAL HIGH (ref 80.0–100.0)

## 2014-10-11 LAB — ABO/RH: ABO/RH(D): O POS

## 2014-10-11 LAB — CBC
HCT: 27.2 % — ABNORMAL LOW (ref 36.0–46.0)
HEMATOCRIT: 28.5 % — AB (ref 36.0–46.0)
HEMOGLOBIN: 8.4 g/dL — AB (ref 12.0–15.0)
Hemoglobin: 7.9 g/dL — ABNORMAL LOW (ref 12.0–15.0)
MCH: 23.5 pg — ABNORMAL LOW (ref 26.0–34.0)
MCH: 23.4 pg — AB (ref 26.0–34.0)
MCHC: 29 g/dL — ABNORMAL LOW (ref 30.0–36.0)
MCHC: 29.5 g/dL — AB (ref 30.0–36.0)
MCV: 79.6 fL (ref 78.0–100.0)
MCV: 80.5 fL (ref 78.0–100.0)
PLATELETS: 343 10*3/uL (ref 150–400)
Platelets: 341 10*3/uL (ref 150–400)
RBC: 3.38 MIL/uL — ABNORMAL LOW (ref 3.87–5.11)
RBC: 3.58 MIL/uL — AB (ref 3.87–5.11)
RDW: 18.3 % — ABNORMAL HIGH (ref 11.5–15.5)
RDW: 18.3 % — AB (ref 11.5–15.5)
WBC: 22.3 10*3/uL — ABNORMAL HIGH (ref 4.0–10.5)
WBC: 20.5 10*3/uL — AB (ref 4.0–10.5)

## 2014-10-11 LAB — LACTIC ACID, PLASMA
LACTIC ACID, VENOUS: 1.4 mmol/L (ref 0.5–2.0)
LACTIC ACID, VENOUS: 1.6 mmol/L (ref 0.5–2.0)
LACTIC ACID, VENOUS: 2.2 mmol/L — AB (ref 0.5–2.0)
Lactic Acid, Venous: 2.4 mmol/L (ref 0.5–2.0)

## 2014-10-11 LAB — APTT: APTT: 40 s — AB (ref 24–37)

## 2014-10-11 LAB — PHOSPHORUS: Phosphorus: 5.6 mg/dL — ABNORMAL HIGH (ref 2.5–4.6)

## 2014-10-11 LAB — FIBRINOGEN: Fibrinogen: 345 mg/dL (ref 204–475)

## 2014-10-11 LAB — TROPONIN I: Troponin I: <0.03 ng/mL (ref <0.031)

## 2014-10-11 LAB — MAGNESIUM: Magnesium: 1.3 mg/dL — ABNORMAL LOW (ref 1.7–2.4)

## 2014-10-11 MED ORDER — CHLORHEXIDINE GLUCONATE 0.12% ORAL RINSE (MEDLINE KIT)
15.0000 mL | Freq: Two times a day (BID) | OROMUCOSAL | Status: DC
  Administered 2014-10-11 – 2014-10-12 (×2): 15 mL via OROMUCOSAL

## 2014-10-11 MED ORDER — MAGNESIUM SULFATE 2 GM/50ML IV SOLN
2.0000 g | Freq: Once | INTRAVENOUS | Status: AC
  Administered 2014-10-11: 2 g via INTRAVENOUS
  Filled 2014-10-11: qty 50

## 2014-10-11 MED ORDER — ANTISEPTIC ORAL RINSE SOLUTION (CORINZ)
7.0000 mL | OROMUCOSAL | Status: DC
  Administered 2014-10-11 – 2014-10-12 (×8): 7 mL via OROMUCOSAL

## 2014-10-11 MED ORDER — ALBUMIN HUMAN 25 % IV SOLN
12.5000 g | Freq: Once | INTRAVENOUS | Status: AC
  Administered 2014-10-11: 12.5 g via INTRAVENOUS
  Filled 2014-10-11: qty 50

## 2014-10-11 MED ORDER — PANTOPRAZOLE SODIUM 40 MG IV SOLR
40.0000 mg | Freq: Two times a day (BID) | INTRAVENOUS | Status: DC
  Administered 2014-10-11 – 2014-10-16 (×11): 40 mg via INTRAVENOUS
  Filled 2014-10-11 (×13): qty 40

## 2014-10-11 MED ORDER — ANTISEPTIC ORAL RINSE SOLUTION (CORINZ)
7.0000 mL | Freq: Four times a day (QID) | OROMUCOSAL | Status: DC
  Administered 2014-10-11 (×2): 7 mL via OROMUCOSAL

## 2014-10-11 MED ORDER — SODIUM BICARBONATE 8.4 % IV SOLN
100.0000 meq | Freq: Once | INTRAVENOUS | Status: AC
  Administered 2014-10-11: 100 meq via INTRAVENOUS
  Filled 2014-10-11: qty 100

## 2014-10-11 MED ORDER — SODIUM CHLORIDE 0.9 % IV BOLUS (SEPSIS)
1000.0000 mL | Freq: Once | INTRAVENOUS | Status: AC
  Administered 2014-10-11: 1000 mL via INTRAVENOUS

## 2014-10-11 MED ORDER — ALBUMIN HUMAN 25 % IV SOLN
25.0000 g | Freq: Once | INTRAVENOUS | Status: AC
  Administered 2014-10-11: 25 g via INTRAVENOUS
  Filled 2014-10-11: qty 100

## 2014-10-11 MED ORDER — CHLORHEXIDINE GLUCONATE 0.12% ORAL RINSE (MEDLINE KIT)
15.0000 mL | Freq: Two times a day (BID) | OROMUCOSAL | Status: DC
  Administered 2014-10-11: 15 mL via OROMUCOSAL

## 2014-10-11 NOTE — Progress Notes (Addendum)
PULMONARY / CRITICAL CARE MEDICINE   Name: [name] MRN: [id number] DOB: 1955/07/28    ADMISSION DATE:  10/10/2014  REFERRING MD :  Selena Lesser   CHIEF COMPLAINT:  Bowel perforation   INITIAL PRESENTATION: 59yo female smoker with hx ETOH cirrhosis with worsening ascites requiring more frequent paracentesis with most recent on 9/15 with 10.5L removed.  She presented to [facility] hospital 9/25 with 2 day hx worsening abd pain and increased abd distension. She was hypotensive requiring pressors and CT abd showed free air.  She was tx to [facility] 9/25 for surgical intervention and ICU mgmt.   STUDIES:  CT abd/pelvis 9/25 - Extraluminal contrast along the distal stomach/proximal duodenum, possibly secondary to a perforated gastric ulcer along the posterior gastric antrum.   Associated moderate pneumoperitoneum.   Large volume abdominopelvic ascites.  SIGNIFICANT EVENTS: 9/25 - Transfer to [facility] from OSH 9/25 - Ex Lap & repair/patch of pre-pyloric perforated gastric ulcer  SUBJECTIVE: Patient underwent surgical repair prepyloric gastric ulcer. Continue to have vasopressor requirements overnight.  REVIEW OF SYSTEMS: Unobtainable as the patient is currently intubated.  VITAL SIGNS: Temp:  [97.9 F (36.6 C)-98.8 F (37.1 C)] 97.9 F (36.6 C) (09/26 0740) Pulse Rate:  [43-115] 106 (09/26 1000) Resp:  [11-26] 19 (09/26 1000) BP: (72-122)/(39-77) 100/41 mmHg (09/26 1000) SpO2:  [94 %-100 %] 100 % (09/26 1000) Arterial Line BP: (90-149)/(34-62) 100/34 mmHg (09/26 1000) FiO2 (%):  [40 %-100 %] 40 % (09/26 0800) Weight:  [121 lb 11.1 oz (55.2 kg)-136 lb 14.4 oz (62.097 kg)] 121 lb 11.1 oz (55.2 kg) (09/26 0500) HEMODYNAMICS: CVP:  [6 mmHg] 6 mmHg VENTILATOR SETTINGS: Vent Mode:  [-] PSV;CPAP FiO2 (%):  [40 %-100 %] 40 % Set Rate:  [15 bmp] 15 bmp Vt Set:  [460 mL] 460 mL PEEP:  [5 cmH20] 5 cmH20 Pressure Support:  [0 cmH20-5 cmH20] 0 cmH20 Plateau Pressure:  [14 cmH20-15 cmH20] 14  cmH20 INTAKE / OUTPUT:  Intake/Output Summary (Last 24 hours) at 10/11/14 1108 Last data filed at 10/11/14 1000  Gross per 24 hour  Intake 4516.96 ml  Output    700 ml  Net 3816.96 ml    PHYSICAL EXAMINATION: General:  Sedated on propofol. No distress. Comfortable in bed.  Integument:  Warm & dry. No rash on exposed skin abdominal dressing in place HEENT: Endotracheal tube in place. PERRL. no scleral injection.  Cardiovascular:  Regular rate. No edema. No appreciable JVD.  Pulmonary:  Decreased breath sounds bilateral lung bases. Symmetric chest wall  rise on ventilator Abdomen: SofHypoactivel bowel sounds. Abdominal dressing in place. Neurological: Patient spontaneously moving all 4 extremities. Moves bilateral upper extremities on command. Currently on sedation with propofol.   LABS:  CBC  Recent Labs Lab 10/10/14 1854 10/11/14 0037 10/11/14 0646  WBC 18.7* 20.5* 22.3*  HGB 8.5* 8.4* 7.9*  HCT 29.3* 28.5* 27.2*  PLT 394 341 343   Coag's  Recent Labs Lab 10/10/14 0710 10/10/14 1854  APTT 31 35  INR 1.24 1.49   BMET  Recent Labs Lab 10/10/14 1854 10/11/14 0037 10/11/14 0646  NA 133* 134* 135  K 3.0* 3.8 3.7  CL 103 108 113*  CO2 17* 16* 14*  BUN 24* 24* 24*  CREATININE 1.80* 1.72* 1.74*  GLUCOSE 111* 118* 103*   Electrolytes  Recent Labs Lab 10/10/14 1854 10/11/14 0037 10/11/14 0646  CALCIUM 7.5* 6.7* 6.1*  MG 1.5*  --  1.3*  PHOS 5.8*  --  5.6*   Sepsis Markers  Recent Labs Lab 10/10/14 1534 10/10/14 1854 10/10/14 1914 10/11/14 0058  LATICACIDVEN 2.8*  --  3.9* 2.2*  PROCALCITON  --  25.82  --   --    ABG  Recent Labs Lab 10/10/14 1909 10/11/14 0047 10/11/14 0546  PHART 7.334* 7.264* 7.271*  PCO2ART 29.2* 41.3 27.9*  PO2ART 62.0* 433.0* 148*   Liver Enzymes  Recent Labs Lab 10/10/14 0710 10/10/14 1854  AST 60* 31  ALT 6* 9*  ALKPHOS 97 68  BILITOT 1.4* 0.9  ALBUMIN 2.6* 1.9*   Cardiac Enzymes  Recent Labs Lab  10/10/14 1854 10/11/14 0037 10/11/14 0646  TROPONINI <0.03 <0.03 <0.03   Glucose  Recent Labs Lab 10/10/14 1847  GLUCAP 103*    Imaging Dg Chest Portable 1 View  10/10/2014   CLINICAL DATA:  Placement of right internal jugular line.  EXAM: PORTABLE CHEST 1 VIEW  COMPARISON:  None.  FINDINGS: Right internal jugular central line is in place with the tip in the lower right atrium or possibly into the IVC. This is approximately 10 cm below the cavoatrial junction. Low lung volumes. Minimal bibasilar atelectasis. Heart is normal size. No acute bony abnormality.  IMPRESSION: Right internal jugular central line passes were well into the right atrium or possibly through the heart into the IVC, approximately 10 cm below the cavoatrial junction.  Low lung volumes, bibasilar atelectasis.   Electronically Signed   By: Rolm Baptise M.D.   On: 10/10/2014 13:07     ASSESSMENT / PLAN:  PULMONARY A: Chronic Tobacco Use  P:   Continuing ventilator support pending improvement in mental status  Vent bundle  CARDIOVASCULAR CVL R IJ CVL ([place]) 9/25>>> A: Septic shock   P:  Continue levophed and titrate as able for goal MAP >55 & SBP Albumin infusion Avoiding crystalloids  RENAL A: Acute Renal Failure Hypomagnesemia - Replacing IV. Mild Hyponatremia - resolved Mild Hypokalemia - resolved Hypocalcemia - Corrects to 7.8 w/ albumin  P:   Trending lactic acid  Monitoring UOP with Foley Trending renal function w/ daily BUN/Creatinine Daily electrolytes  GASTROINTESTINAL A: Perforated gastric ulcer - S/P surgical patch repair 9/25 ETOH cirrhosis  Ascites   P:   Post-Op feeding initiation per surgery recommendations Repeat Hgb/Hct stat Repeat Coags stat Albumin 25gm IV now  HEMATOLOGIC A: Anemia - secondary to cirrhosis & perforated gastric ulcer Leukocytosis   P:  Monitoring Leukocytosis w/ daily CBC Protonix IV bid SCD's  Avoiding chemical DVT prophylaxis given  ulcer  INFECTIOUS A: Sepsis - Intra-abdominal Source PCN allergy   P:   BCx2 9/25>>> UC 9/25>>>  Vanc 9/25>>> Aztreonam 9/25>>> Flagyl 9/25>>>  ENDOCRINE A: Mild Hyperglycemia   P:   Monitor glucose daily labs  NEUROLOGIC A: No active issue  P:   Propofol for sedation  Dilaudid IV every 2 hours when necessary  FAMILY  - Updates:   No family available  TODAY'S SUMMARY: 59 year old female with known underlying hepatic cirrhosis and abdominal ascites after transfer from outside hospital with perforated gastric ulcer. Status post patch repair on 9/25. Remains hypotensive and requiring vasopressor support. Administering IV albumin given significant amount of intra-abdominal ascites removed during operation. Continuing to trend lactic acid. Patient going into acute renal failure and continuing vasopressor support.  I have spent a total of 32 minutes of critical care time today caring for the patient & reviewing the patient's electronic medical record.  Sonia Baller Ashok Cordia, M.D. [facility] Pager:  [phone] After 3pm or if  no response, call [phone]  10/11/2014  11:08 AM

## 2014-10-11 NOTE — Progress Notes (Signed)
CRITICAL VALUE ALERT  Critical value received:  Lactic acid 2.4  Date of notification:  10/11/14  Time of notification:  1224  Critical value read back:Yes.    Nurse who received alert:  Oneita Jolly  MD notified (1st page):  Dr. Ashok Cordia  Time of first page:  1155  Responding MD:  Dr. Ashok Cordia  Time MD responded:  1155

## 2014-10-11 NOTE — Progress Notes (Signed)
[facility] Progress Note 1 Day Post-Op  Subjective: Pt. Is being weaned, but still very acidotic. On propofol and Levophed drips.    Objective: Vital signs in last 24 hours: Temp:  [97.9 F (36.6 C)-98.8 F (37.1 C)] 97.9 F (36.6 C) (09/26 0740) Pulse Rate:  [43-115] 103 (09/26 0800) Resp:  [11-26] 20 (09/26 0800) BP: (72-122)/(39-77) 97/41 mmHg (09/26 0800) SpO2:  [94 %-100 %] 100 % (09/26 0800) Arterial Line BP: (90-149)/(37-62) 111/40 mmHg (09/26 0800) FiO2 (%):  [40 %-100 %] 40 % (09/26 0800) Weight:  [55.2 kg (121 lb 11.1 oz)-62.097 kg (136 lb 14.4 oz)] 55.2 kg (121 lb 11.1 oz) (09/26 0500) Last BM Date: 10/09/14  Intake/Output from previous day: 09/25 0701 - 09/26 0700 In: 3992.2 [I.V.:2092.2; IV Piggyback:1900] Out: 390 [Urine:110; Emesis/NG output:250; Blood:30] Intake/Output this shift: Total I/O In: 185.5 [I.V.:185.5] Out: 200 [Urine:50; Emesis/NG output:150]  General: No acute distress and on pressor of Levo  Lungs: Clear  Abd: Soft, distended, no drains.  No acute tenderness diffusely.  NGT output is bilious, but not excessive.  Extremities: No changes  Neuro: Intact it seems.  Lab Results:  @LABLAST2 (wbc:2,hgb:2,hct:2,plt:2) BMET ) Recent Labs  10/11/14 0037 10/11/14 0646  NA 134* 135  K 3.8 3.7  CL 108 113*  CO2 16* 14*  GLUCOSE 118* 103*  BUN 24* 24*  CREATININE 1.72* 1.74*  CALCIUM 6.7* 6.1*   PT/INR  Recent Labs  10/10/14 0710 10/10/14 1854  LABPROT 15.8* 18.1*  INR 1.24 1.49   ABG  Recent Labs  10/11/14 0047 10/11/14 0546  PHART 7.264* 7.271*  HCO3 18.7* 12.4*    Studies/Results: Ct Abdomen Pelvis Wo Contrast  10/10/2014   CLINICAL DATA:  Decrease abdominal pain, liver disease, ascites  EXAM: CT ABDOMEN AND PELVIS WITHOUT CONTRAST  TECHNIQUE: Multidetector CT imaging of the abdomen and pelvis was performed following the standard protocol without IV contrast.  COMPARISON:  Right upper quadrant ultrasound dated 05/21/2014   FINDINGS: Lower chest: Trace bilateral pleural effusions. Mild dependent atelectasis the lung bases.  Hepatobiliary: Cirrhotic configuration of the liver.  Gallbladder is unremarkable. No intrahepatic or extrahepatic ductal dilatation.  Pancreas: Within normal limits.  Spleen: Splenomegaly.  Adrenals/Urinary Tract: Adrenal glands are within normal limits.  Right kidney is notable for three nonobstructing right renal calculi measuring up to 4 mm. 1.4 cm lateral right upper pole renal cyst (series 2/ image 44).  4 mm posterior interpolar left renal cyst (series 2/ image 41).  No hydronephrosis.  Bladder is underdistended but unremarkable.  Stomach/Bowel: Contrast within the stomach. Associated extraluminal contrast in the right mid abdomen (series 2/images 26 and 29), associated with the distal gastric antrum and proximal duodenum.  Focal contrast along the posterior aspect of the gastric antrum suggest a perforated gastric ulcer (series 2/image 33).  Scattered foci of free air with additional moderate pneumoperitoneum in the anterior abdomen (series 2/ images 41 and 62).  No evidence of bowel obstruction.  Appendix is not discretely visualized.  Mild wall thickening involving the right colon (series 2/ image 65), possibly secondary to ascites, infectious/ inflammatory colitis considered less likely but not excluded.  Colonic diverticulosis, without evidence of diverticulitis.  Vascular/Lymphatic: Atherosclerotic calcifications of the abdominal aorta and branch vessels.  No suspicious abdominopelvic lymphadenopathy.  Reproductive: Uterus is notable for multiple uterine fibroids, including a 4.6 cm calcified subserosal fundal fibroid (series 2/image 78).  No adnexal masses.  Other: Large volume abdominopelvic ascites.  Musculoskeletal: Degenerative changes of the visualized thoracolumbar spine, most prominent  at L4-5.  IMPRESSION: Extraluminal contrast along the distal stomach/proximal duodenum, possibly secondary to a  perforated gastric ulcer along the posterior gastric antrum.  Associated moderate pneumoperitoneum.  Large volume abdominopelvic ascites.  Additional ancillary findings as above.  Critical Value/emergent results were called by telephone at the time of interpretation on 10/10/2014 at 11:00 am to Dr. Carrie Mew, who verbally acknowledged these results.   Electronically Signed   By: Julian Hy M.D.   On: 10/10/2014 11:01   Dg Chest Portable 1 View  10/10/2014   CLINICAL DATA:  Placement of right internal jugular line.  EXAM: PORTABLE CHEST 1 VIEW  COMPARISON:  None.  FINDINGS: Right internal jugular central line is in place with the tip in the lower right atrium or possibly into the IVC. This is approximately 10 cm below the cavoatrial junction. Low lung volumes. Minimal bibasilar atelectasis. Heart is normal size. No acute bony abnormality.  IMPRESSION: Right internal jugular central line passes were well into the right atrium or possibly through the heart into the IVC, approximately 10 cm below the cavoatrial junction.  Low lung volumes, bibasilar atelectasis.   Electronically Signed   By: Rolm Baptise M.D.   On: 10/10/2014 13:07    Anti-infectives: Anti-infectives    Start     Dose/Rate Route Frequency Ordered Stop   10/11/14 2000  vancomycin (VANCOCIN) IVPB 1000 mg/200 mL premix     1,000 mg 200 mL/hr over 60 Minutes Intravenous Every 24 hours 10/10/14 1900     10/10/14 2000  metroNIDAZOLE (FLAGYL) IVPB 500 mg     500 mg 100 mL/hr over 60 Minutes Intravenous Every 8 hours 10/10/14 1900     10/10/14 2000  vancomycin (VANCOCIN) 1,500 mg in sodium chloride 0.9 % 500 mL IVPB     1,500 mg 250 mL/hr over 120 Minutes Intravenous  Once 10/10/14 1900 10/11/14 0040   10/10/14 2000  aztreonam (AZACTAM) 2 g in dextrose 5 % 50 mL IVPB     2 g 100 mL/hr over 30 Minutes Intravenous Every 12 hours 10/10/14 1900     10/10/14 1900  aztreonam (AZACTAM) 2 g in dextrose 5 % 50 mL IVPB  Status:   Discontinued     2 g 100 mL/hr over 30 Minutes Intravenous 4 times per day 10/10/14 1846 10/10/14 1900   10/10/14 1845  vancomycin (VANCOCIN) IVPB 1000 mg/200 mL premix  Status:  Discontinued     1,000 mg 200 mL/hr over 60 Minutes Intravenous Every 12 hours 10/10/14 1844 10/10/14 1900      Assessment/Plan: s/p Procedure(s): EXPLORATORY LAPAROTOMY, OMENTAL GRAHAM PATCHING OF PRE-PYLORIC GASTRIC ULCER, AND PRIMARY UMBILICAL HERNIA REPAIR Some type of nutrition needs to be considered soon.  Cannot take orally yet, but probably can start in 3 dyas or so if she gets extubated.  LOS: 1 day   Kathryne Eriksson. Dahlia Bailiff, MD, FACS [phone] [facility] 10/11/2014

## 2014-10-11 NOTE — Progress Notes (Signed)
[facility] Progress Note Patient Name: [name] DOB: 1955-04-26 MRN: [id number]   Date of Service  10/11/2014  HPI/Events of Note  ABG = 7.27/27.9/148/12.4  eICU Interventions  Will give NaHCO3 100 meq IV now.      Intervention Category Major Interventions: Respiratory failure - evaluation and management;Acid-Base disturbance - evaluation and management  Sommer,Steven Eugene 10/11/2014, 6:05 AM

## 2014-10-11 NOTE — Progress Notes (Signed)
[facility] Progress Note Patient Name: [name] DOB: 08/12/1955 MRN: [id number]   Date of Service  10/11/2014  HPI/Events of Note  cvp 2    eICU Interventions  Give albumin 25% 12.5 gm x one dose      Intervention Category Major Interventions: Hypovolemia - evaluation and treatment with fluids;Hypotension - evaluation and management  Asencion Noble 10/11/2014, 8:32 PM

## 2014-10-11 NOTE — Progress Notes (Signed)
[facility] Progress Note Patient Name: [name] DOB: 1955-07-22 MRN: [id number]   Date of Service  10/11/2014  HPI/Events of Note  Multiple issues: 1. Needs order for NGT to LIS and 2. Oliguria with CVP = 6.  eICU Interventions  Will order: 1. Place NGT to LIS. 2. Bolus with 0.9 NaCl 1 liter IV over 1 hour now.      Intervention Category Intermediate Interventions: Other:;Oliguria - evaluation and management  Sommer,Steven Cornelia Copa 10/11/2014, 5:29 AM

## 2014-10-11 NOTE — Progress Notes (Signed)
CRITICAL VALUE ALERT  Critical value received:  Calcium 6.1  Date of notification:  10/11/14  Time of notification:  0734  Critical value read back:Yes.    Nurse who received alert:  Oneita Jolly  MD notified (1st page):  Dr. Ashok Cordia  Time of first page:  0900  Responding MD:  Dr. Ashok Cordia  Time MD responded:  [phone]

## 2014-10-11 NOTE — Transfer of Care (Signed)
Immediate Anesthesia Transfer of Care Note  Patient: [name]  Procedure(s) Performed: Procedure(s): EXPLORATORY LAPAROTOMY, OMENTAL GRAHAM PATCHING OF PRE-PYLORIC GASTRIC ULCER, AND PRIMARY UMBILICAL HERNIA REPAIR (N/A)  Patient Location: ICU  Anesthesia Type:General  Level of Consciousness: sedated  Airway & Oxygen Therapy: Patient remains intubated per anesthesia plan and Patient placed on Ventilator (see vital sign flow sheet for setting)  Post-op Assessment: Report given to RN and Post -op Vital signs reviewed and stable  Post vital signs: Reviewed and stable  Last Vitals:  Filed Vitals:   10/11/14 0800  BP: 97/41  Pulse: 103  Temp:   Resp: 20    Complications: No apparent anesthesia complications

## 2014-10-11 NOTE — Progress Notes (Signed)
[facility] Progress Note  1 Day Post-Op  Subjective: Pt intubated sedated on pressors.  Arousable.  Low urine output.  No BM.  NG tube placed with 253mL output.  Objective: Vital signs in last 24 hours: Temp:  [97.9 F (36.6 C)-98.8 F (37.1 C)] 97.9 F (36.6 C) (09/26 0740) Pulse Rate:  [40-115] 102 (09/26 0700) Resp:  [11-26] 14 (09/26 0700) BP: (66-122)/(39-77) 84/39 mmHg (09/26 0600) SpO2:  [94 %-100 %] 100 % (09/26 0700) Arterial Line BP: (90-149)/(37-62) 118/40 mmHg (09/26 0700) FiO2 (%):  [40 %-100 %] 40 % (09/26 0301) Weight:  [55.2 kg (121 lb 11.1 oz)-62.097 kg (136 lb 14.4 oz)] 55.2 kg (121 lb 11.1 oz) (09/26 0500) Last BM Date: 10/09/14  Intake/Output from previous day: 09/25 0701 - 09/26 0700 In: 3808.5 [I.V.:1908.5; IV Piggyback:1900] Out: 390 [Urine:110; Emesis/NG output:250; Blood:30] Intake/Output this shift:    PE: Gen:  Alert, NAD, pleasant Card:  RRR, no M/G/R heard Pulm:  CTA, no W/R/R Abd: Soft, distended, ascites noted, tender to palpation over incision site, no BS heard, no HSM, incisions with sanguinous drainage on bandage Ext:  No erythema, edema, or tenderness, not able to palpate DP or PT, not able to hear on doppler, but LE are warm and dry without rash  Lab Results:   Recent Labs  10/11/14 0037 10/11/14 0646  WBC 20.5* 22.3*  HGB 8.4* 7.9*  HCT 28.5* 27.2*  PLT 341 343   BMET  Recent Labs  10/11/14 0037 10/11/14 0646  NA 134* 135  K 3.8 3.7  CL 108 113*  CO2 16* 14*  GLUCOSE 118* 103*  BUN 24* 24*  CREATININE 1.72* 1.74*  CALCIUM 6.7* 6.1*   PT/INR  Recent Labs  10/10/14 0710 10/10/14 1854  LABPROT 15.8* 18.1*  INR 1.24 1.49   CMP     Component Value Date/Time   NA 135 10/11/2014 0646   NA 145 01/26/2014 0615   K 3.7 10/11/2014 0646   K 4.2 01/26/2014 0615   CL 113* 10/11/2014 0646   CL 116* 01/26/2014 0615   CO2 14* 10/11/2014 0646   CO2 25 01/26/2014 0615   GLUCOSE 103* 10/11/2014 0646   GLUCOSE 102* 01/26/2014 0615   BUN 24* 10/11/2014 0646   BUN 4* 01/26/2014 0615   CREATININE 1.74* 10/11/2014 0646   CREATININE 0.68 01/26/2014 0615   CALCIUM 6.1* 10/11/2014 0646   CALCIUM 7.4* 01/26/2014 0615   PROT 5.2* 10/10/2014 1854   PROT 5.9* 01/26/2014 0615   ALBUMIN 1.9* 10/10/2014 1854   ALBUMIN 2.3* 01/26/2014 0615   AST 31 10/10/2014 1854   AST 207* 01/26/2014 0615   ALT 9* 10/10/2014 1854   ALT 54 01/26/2014 0615   ALKPHOS 68 10/10/2014 1854   ALKPHOS 252* 01/26/2014 0615   BILITOT 0.9 10/10/2014 1854   BILITOT 0.9 01/26/2014 0615   GFRNONAA 31* 10/11/2014 0646   GFRNONAA >60 01/26/2014 0615   GFRAA 36* 10/11/2014 0646   GFRAA >60 01/26/2014 0615   Lipase     Component Value Date/Time   LIPASE 29 10/10/2014 1854       Studies/Results: Ct Abdomen Pelvis Wo Contrast  10/10/2014   CLINICAL DATA:  Decrease abdominal pain, liver disease, ascites  EXAM: CT ABDOMEN AND PELVIS WITHOUT CONTRAST  TECHNIQUE: Multidetector CT imaging of the abdomen and pelvis was performed following the standard protocol without IV contrast.  COMPARISON:  Right upper quadrant ultrasound dated 05/21/2014  FINDINGS: Lower chest: Trace bilateral pleural effusions. Mild dependent atelectasis  the lung bases.  Hepatobiliary: Cirrhotic configuration of the liver.  Gallbladder is unremarkable. No intrahepatic or extrahepatic ductal dilatation.  Pancreas: Within normal limits.  Spleen: Splenomegaly.  Adrenals/Urinary Tract: Adrenal glands are within normal limits.  Right kidney is notable for three nonobstructing right renal calculi measuring up to 4 mm. 1.4 cm lateral right upper pole renal cyst (series 2/ image 44).  4 mm posterior interpolar left renal cyst (series 2/ image 41).  No hydronephrosis.  Bladder is underdistended but unremarkable.  Stomach/Bowel: Contrast within the stomach. Associated extraluminal contrast in the right mid abdomen (series 2/images 26 and 29), associated with the distal  gastric antrum and proximal duodenum.  Focal contrast along the posterior aspect of the gastric antrum suggest a perforated gastric ulcer (series 2/image 33).  Scattered foci of free air with additional moderate pneumoperitoneum in the anterior abdomen (series 2/ images 41 and 62).  No evidence of bowel obstruction.  Appendix is not discretely visualized.  Mild wall thickening involving the right colon (series 2/ image 65), possibly secondary to ascites, infectious/ inflammatory colitis considered less likely but not excluded.  Colonic diverticulosis, without evidence of diverticulitis.  Vascular/Lymphatic: Atherosclerotic calcifications of the abdominal aorta and branch vessels.  No suspicious abdominopelvic lymphadenopathy.  Reproductive: Uterus is notable for multiple uterine fibroids, including a 4.6 cm calcified subserosal fundal fibroid (series 2/image 78).  No adnexal masses.  Other: Large volume abdominopelvic ascites.  Musculoskeletal: Degenerative changes of the visualized thoracolumbar spine, most prominent at L4-5.  IMPRESSION: Extraluminal contrast along the distal stomach/proximal duodenum, possibly secondary to a perforated gastric ulcer along the posterior gastric antrum.  Associated moderate pneumoperitoneum.  Large volume abdominopelvic ascites.  Additional ancillary findings as above.  Critical Value/emergent results were called by telephone at the time of interpretation on 10/10/2014 at 11:00 am to Dr. Carrie Mew, who verbally acknowledged these results.   Electronically Signed   By: Julian Hy M.D.   On: 10/10/2014 11:01   Dg Chest Portable 1 View  10/10/2014   CLINICAL DATA:  Placement of right internal jugular line.  EXAM: PORTABLE CHEST 1 VIEW  COMPARISON:  None.  FINDINGS: Right internal jugular central line is in place with the tip in the lower right atrium or possibly into the IVC. This is approximately 10 cm below the cavoatrial junction. Low lung volumes. Minimal bibasilar  atelectasis. Heart is normal size. No acute bony abnormality.  IMPRESSION: Right internal jugular central line passes were well into the right atrium or possibly through the heart into the IVC, approximately 10 cm below the cavoatrial junction.  Low lung volumes, bibasilar atelectasis.   Electronically Signed   By: Rolm Baptise M.D.   On: 10/10/2014 13:07    Anti-infectives: Anti-infectives    Start     Dose/Rate Route Frequency Ordered Stop   10/11/14 2000  vancomycin (VANCOCIN) IVPB 1000 mg/200 mL premix     1,000 mg 200 mL/hr over 60 Minutes Intravenous Every 24 hours 10/10/14 1900     10/10/14 2000  metroNIDAZOLE (FLAGYL) IVPB 500 mg     500 mg 100 mL/hr over 60 Minutes Intravenous Every 8 hours 10/10/14 1900     10/10/14 2000  vancomycin (VANCOCIN) 1,500 mg in sodium chloride 0.9 % 500 mL IVPB     1,500 mg 250 mL/hr over 120 Minutes Intravenous  Once 10/10/14 1900 10/11/14 0040   10/10/14 2000  aztreonam (AZACTAM) 2 g in dextrose 5 % 50 mL IVPB     2 g 100  mL/hr over 30 Minutes Intravenous Every 12 hours 10/10/14 1900     10/10/14 1900  aztreonam (AZACTAM) 2 g in dextrose 5 % 50 mL IVPB  Status:  Discontinued     2 g 100 mL/hr over 30 Minutes Intravenous 4 times per day 10/10/14 1846 10/10/14 1900   10/10/14 1845  vancomycin (VANCOCIN) IVPB 1000 mg/200 mL premix  Status:  Discontinued     1,000 mg 200 mL/hr over 60 Minutes Intravenous Every 12 hours 10/10/14 1844 10/10/14 1900       Assessment/Plan Perforated gastric ulcer POD #1 s/p Exploratory laparotomy, graham patch repair of pre pyloric gastric ulcer, primary umbilical repair (not part of the surgical incision) - Dr. Barry Dienes -Appreciate CCM management -NPO, NG tube,  IVF, pain control, antiemetics -Protonix IV -On vent sedated, on levofed -Wean from vent as able -Await extubation and return of bowel function -Will likely need UGI to ensure no leak when bowel function returns 3-5 days. -Hold dvt proph for now until Hgb  stabilized  Acute on chronic anemia Leukocytosis - likely reaction from surgery  Tobacco abuse Septic shock AKI - Cr. 1.74 ETOH Cirrhosis/ascites     LOS: 1 day    Nat Christen 10/11/2014, 8:03 AM Pager: [phone]

## 2014-10-12 ENCOUNTER — Inpatient Hospital Stay (HOSPITAL_COMMUNITY): Payer: Medicaid Other

## 2014-10-12 DIAGNOSIS — E876 Hypokalemia: Secondary | ICD-10-CM

## 2014-10-12 DIAGNOSIS — D689 Coagulation defect, unspecified: Secondary | ICD-10-CM

## 2014-10-12 LAB — URINALYSIS W MICROSCOPIC (NOT AT ARMC)
BILIRUBIN URINE: NEGATIVE
GLUCOSE, UA: NEGATIVE mg/dL
KETONES UR: NEGATIVE mg/dL
Leukocytes, UA: NEGATIVE
NITRITE: NEGATIVE
PH: 6.5 (ref 5.0–8.0)
PROTEIN: NEGATIVE mg/dL
Specific Gravity, Urine: 1.01 (ref 1.005–1.030)
UROBILINOGEN UA: 0.2 mg/dL (ref 0.0–1.0)

## 2014-10-12 LAB — CBC
HCT: 20.7 % — ABNORMAL LOW (ref 36.0–46.0)
HEMOGLOBIN: 6.1 g/dL — AB (ref 12.0–15.0)
MCH: 23 pg — ABNORMAL LOW (ref 26.0–34.0)
MCHC: 29.5 g/dL — AB (ref 30.0–36.0)
MCV: 78.1 fL (ref 78.0–100.0)
PLATELETS: 172 10*3/uL (ref 150–400)
RBC: 2.65 MIL/uL — ABNORMAL LOW (ref 3.87–5.11)
RDW: 18.4 % — AB (ref 11.5–15.5)
WBC: 6.9 10*3/uL (ref 4.0–10.5)

## 2014-10-12 LAB — BASIC METABOLIC PANEL
Anion gap: 12 (ref 5–15)
BUN: 21 mg/dL — ABNORMAL HIGH (ref 6–20)
CHLORIDE: 112 mmol/L — AB (ref 101–111)
CO2: 13 mmol/L — AB (ref 22–32)
Calcium: 6.1 mg/dL — CL (ref 8.9–10.3)
Creatinine, Ser: 1.33 mg/dL — ABNORMAL HIGH (ref 0.44–1.00)
GFR calc non Af Amer: 42 mL/min — ABNORMAL LOW (ref >60)
GFR, EST AFRICAN AMERICAN: 49 mL/min — AB (ref >60)
GLUCOSE: 95 mg/dL (ref 65–99)
POTASSIUM: 2.6 mmol/L — AB (ref 3.5–5.1)
Sodium: 137 mmol/L (ref 135–145)

## 2014-10-12 LAB — CBC WITH DIFFERENTIAL/PLATELET
Basophils Absolute: 0 10*3/uL (ref 0.0–0.1)
Basophils Relative: 0 %
EOS PCT: 2 %
Eosinophils Absolute: 0.1 10*3/uL (ref 0.0–0.7)
HEMATOCRIT: 23 % — AB (ref 36.0–46.0)
HEMOGLOBIN: 7 g/dL — AB (ref 12.0–15.0)
LYMPHS ABS: 0.2 10*3/uL — AB (ref 0.7–4.0)
LYMPHS PCT: 5 %
MCH: 23.9 pg — AB (ref 26.0–34.0)
MCHC: 30.4 g/dL (ref 30.0–36.0)
MCV: 78.5 fL (ref 78.0–100.0)
Monocytes Absolute: 0.2 10*3/uL (ref 0.1–1.0)
Monocytes Relative: 5 %
NEUTROS ABS: 2.8 10*3/uL (ref 1.7–7.7)
NEUTROS PCT: 88 %
Platelets: 113 10*3/uL — ABNORMAL LOW (ref 150–400)
RBC: 2.93 MIL/uL — AB (ref 3.87–5.11)
RDW: 18 % — ABNORMAL HIGH (ref 11.5–15.5)
WBC: 3.2 10*3/uL — ABNORMAL LOW (ref 4.0–10.5)

## 2014-10-12 LAB — COMPREHENSIVE METABOLIC PANEL
ALT: 8 U/L — AB (ref 14–54)
AST: 26 U/L (ref 15–41)
Albumin: 2.1 g/dL — ABNORMAL LOW (ref 3.5–5.0)
Alkaline Phosphatase: 53 U/L (ref 38–126)
Anion gap: 9 (ref 5–15)
BUN: 25 mg/dL — ABNORMAL HIGH (ref 6–20)
CHLORIDE: 110 mmol/L (ref 101–111)
CO2: 15 mmol/L — ABNORMAL LOW (ref 22–32)
CREATININE: 1.48 mg/dL — AB (ref 0.44–1.00)
Calcium: 5.8 mg/dL — CL (ref 8.9–10.3)
GFR, EST AFRICAN AMERICAN: 43 mL/min — AB (ref >60)
GFR, EST NON AFRICAN AMERICAN: 37 mL/min — AB (ref >60)
Glucose, Bld: 113 mg/dL — ABNORMAL HIGH (ref 65–99)
Potassium: 2.7 mmol/L — CL (ref 3.5–5.1)
Sodium: 134 mmol/L — ABNORMAL LOW (ref 135–145)
Total Bilirubin: 1.1 mg/dL (ref 0.3–1.2)
Total Protein: 4.6 g/dL — ABNORMAL LOW (ref 6.5–8.1)

## 2014-10-12 LAB — PROTIME-INR
INR: 2.04 — ABNORMAL HIGH (ref 0.00–1.49)
INR: 1.87 — ABNORMAL HIGH (ref 0.00–1.49)
PROTHROMBIN TIME: 22.9 s — AB (ref 11.6–15.2)
Prothrombin Time: 21.5 seconds — ABNORMAL HIGH (ref 11.6–15.2)

## 2014-10-12 LAB — APTT
APTT: 45 s — AB (ref 24–37)
aPTT: 41 seconds — ABNORMAL HIGH (ref 24–37)

## 2014-10-12 LAB — PREPARE RBC (CROSSMATCH)

## 2014-10-12 LAB — MAGNESIUM: Magnesium: 1.9 mg/dL (ref 1.7–2.4)

## 2014-10-12 LAB — PHOSPHORUS: PHOSPHORUS: 4.2 mg/dL (ref 2.5–4.6)

## 2014-10-12 LAB — GLUCOSE, CAPILLARY: Glucose-Capillary: 111 mg/dL — ABNORMAL HIGH (ref 65–99)

## 2014-10-12 MED ORDER — FUROSEMIDE 10 MG/ML IJ SOLN
INTRAMUSCULAR | Status: AC
  Administered 2014-10-12: 20 mg via INTRAVENOUS
  Filled 2014-10-12: qty 2

## 2014-10-12 MED ORDER — SODIUM CHLORIDE 0.9 % IV SOLN
2.0000 g | Freq: Once | INTRAVENOUS | Status: AC
  Administered 2014-10-12: 2 g via INTRAVENOUS
  Filled 2014-10-12: qty 20

## 2014-10-12 MED ORDER — CHLORHEXIDINE GLUCONATE 0.12 % MT SOLN
15.0000 mL | Freq: Two times a day (BID) | OROMUCOSAL | Status: DC
  Administered 2014-10-12 – 2014-10-13 (×3): 15 mL via OROMUCOSAL

## 2014-10-12 MED ORDER — SODIUM CHLORIDE 0.9 % IV BOLUS (SEPSIS)
750.0000 mL | Freq: Once | INTRAVENOUS | Status: AC
  Administered 2014-10-12: 750 mL via INTRAVENOUS

## 2014-10-12 MED ORDER — DIPHENHYDRAMINE HCL 50 MG/ML IJ SOLN
INTRAMUSCULAR | Status: AC
  Filled 2014-10-12: qty 1

## 2014-10-12 MED ORDER — METHYLPREDNISOLONE SODIUM SUCC 125 MG IJ SOLR
INTRAMUSCULAR | Status: AC
  Administered 2014-10-12: 125 mg
  Filled 2014-10-12: qty 2

## 2014-10-12 MED ORDER — CETYLPYRIDINIUM CHLORIDE 0.05 % MT LIQD
7.0000 mL | Freq: Two times a day (BID) | OROMUCOSAL | Status: DC
  Administered 2014-10-12 – 2014-10-13 (×3): 7 mL via OROMUCOSAL

## 2014-10-12 MED ORDER — SODIUM CHLORIDE 0.9 % IV SOLN: Freq: Once | INTRAVENOUS | Status: DC

## 2014-10-12 MED ORDER — ALBUTEROL SULFATE (5 MG/ML) 0.5% IN NEBU: 2.5000 mg | INHALATION_SOLUTION | Freq: Four times a day (QID) | RESPIRATORY_TRACT | Status: DC

## 2014-10-12 MED ORDER — POTASSIUM CHLORIDE 10 MEQ/50ML IV SOLN
10.0000 meq | INTRAVENOUS | Status: AC
  Administered 2014-10-12 (×4): 10 meq via INTRAVENOUS
  Filled 2014-10-12 (×4): qty 50

## 2014-10-12 MED ORDER — DIPHENHYDRAMINE HCL 50 MG/ML IJ SOLN
12.5000 mg | Freq: Four times a day (QID) | INTRAMUSCULAR | Status: DC | PRN
  Administered 2014-10-12: 12.5 mg via INTRAVENOUS

## 2014-10-12 MED ORDER — FENTANYL CITRATE (PF) 100 MCG/2ML IJ SOLN
25.0000 ug | INTRAMUSCULAR | Status: DC | PRN
  Administered 2014-10-12 – 2014-10-14 (×4): 25 ug via INTRAVENOUS
  Filled 2014-10-12 (×4): qty 2

## 2014-10-12 MED ORDER — FUROSEMIDE 10 MG/ML IJ SOLN
20.0000 mg | Freq: Once | INTRAMUSCULAR | Status: AC
  Administered 2014-10-12: 20 mg via INTRAVENOUS

## 2014-10-12 MED ORDER — VITAMIN K1 10 MG/ML IJ SOLN
5.0000 mg | Freq: Once | INTRAVENOUS | Status: AC
  Administered 2014-10-12: 5 mg via INTRAVENOUS
  Filled 2014-10-12: qty 0.5

## 2014-10-12 MED ORDER — METHYLPREDNISOLONE SODIUM SUCC 40 MG IJ SOLR
40.0000 mg | Freq: Four times a day (QID) | INTRAMUSCULAR | Status: AC
  Administered 2014-10-13 (×3): 40 mg via INTRAVENOUS
  Filled 2014-10-12 (×4): qty 1

## 2014-10-12 MED ORDER — ALBUTEROL SULFATE (2.5 MG/3ML) 0.083% IN NEBU
2.5000 mg | INHALATION_SOLUTION | RESPIRATORY_TRACT | Status: DC | PRN
  Administered 2014-10-12: 2.5 mg via RESPIRATORY_TRACT
  Filled 2014-10-12: qty 3
  Filled 2014-10-12: qty 0.5

## 2014-10-12 MED ORDER — POTASSIUM CHLORIDE 10 MEQ/50ML IV SOLN
10.0000 meq | INTRAVENOUS | Status: AC
  Administered 2014-10-12 – 2014-10-13 (×6): 10 meq via INTRAVENOUS
  Filled 2014-10-12 (×6): qty 50

## 2014-10-12 MED ORDER — SODIUM CHLORIDE 0.9 % IV SOLN
1.0000 g | Freq: Once | INTRAVENOUS | Status: AC
Start: 2014-10-12 — End: 2014-10-12
  Administered 2014-10-12: 1 g via INTRAVENOUS
  Filled 2014-10-12: qty 10

## 2014-10-12 NOTE — Progress Notes (Addendum)
CRITICAL VALUE ALERT  Critical value received:  K+: 2.6 and Calcium: 6.1  Date of notification:  10/12/14  Time of notification:  2056  Critical value read back:Yes.    Nurse who received alert:  Corinda Gubler  MD notified (1st page):  Warren Lacy MD  Time of first page:  [phone]  Responding MD:  Dr. Oletta Darter  Time MD responded:  [phone]

## 2014-10-12 NOTE — Progress Notes (Signed)
Patient and order verified.  Extubated patient per MD order.  Placed patient on 40% aerosol face mask.  Rn present at bedside.  Patient is tolerating well at this time.

## 2014-10-12 NOTE — Progress Notes (Signed)
CRITICAL VALUE ALERT  Critical value received:  K+: 2.7 Ca+: 5.8  Date of notification:  10/12/14  Time of notification:  0650  Critical value read back:Yes.    Nurse who received alert:  Corinda Gubler  MD notified (1st page):  Warren Lacy MD  Time of first page:  [phone]  Responding MD:  Dr. Titus Mould  Time MD responded:  [phone]

## 2014-10-12 NOTE — Progress Notes (Signed)
After completion of new orders from MD to treat reaction, pt vitals are slowly returning to normal. HR: 114, RR 17, O2 98% RA, BP 110/40. Patient states she is feeling better. No obvious signs of dyspnea. Will continue to monitor and update MD as needed.

## 2014-10-12 NOTE — Anesthesia Postprocedure Evaluation (Signed)
  Anesthesia Post-op Note  Patient: [name]  Procedure(s) Performed: Procedure(s): EXPLORATORY LAPAROTOMY, OMENTAL GRAHAM PATCHING OF PRE-PYLORIC GASTRIC ULCER, AND PRIMARY UMBILICAL HERNIA REPAIR (N/A)  Patient Location: ICU  Anesthesia Type:General  Level of Consciousness: sedated  Airway and Oxygen Therapy: Patient remains intubated per anesthesia plan  Post-op Pain: none  Post-op Assessment: Post-op Vital signs reviewed, Patient's Cardiovascular Status Stable and Respiratory Function Stable LLE Motor Response: Purposeful movement   RLE Motor Response: Purposeful movement        Post-op Vital Signs: Reviewed and stable  Last Vitals:  Filed Vitals:   10/12/14 0832  BP:   Pulse:   Temp: 37.7 C  Resp:     Complications: No apparent anesthesia complications

## 2014-10-12 NOTE — Progress Notes (Signed)
Patient was receiving 2nd unit of FFP when she began complaining of back pain. During administration of prn fentanyl, she began complaining of chills and was tachycardic in the 120s increasing to 150s, with no SOB at the time. FFP was stopped immediately at first sign of reaction. Shortly after she became SOB. MD and blood bank were notified. Orders were completed for benadryl, solumedrol, lasix, albuterol, and CXR. Blood product was sent to lab along with urine and blood sample. Form from blood bank was completed and returned to blood bank. Family at bedside and aware of incident. Patient was still awake and able to communicate. Will continue to monitor and update MD as needed.

## 2014-10-12 NOTE — Progress Notes (Signed)
PULMONARY / CRITICAL CARE MEDICINE   Name: [name] MRN: [id number] DOB: October 16, 1955    ADMISSION DATE:  10/10/2014  REFERRING MD :  Selena Lesser   CHIEF COMPLAINT:  Bowel perforation   INITIAL PRESENTATION: 59yo female smoker with hx ETOH cirrhosis with worsening ascites requiring more frequent paracentesis with most recent on 9/15 with 10.5L removed.  She presented to [facility] hospital 9/25 with 2 day hx worsening abd pain and increased abd distension. She was hypotensive requiring pressors and CT abd showed free air.  She was tx to [facility] 9/25 for surgical intervention and ICU mgmt.   STUDIES:  CT abd/pelvis 9/25 - Extraluminal contrast along the distal stomach/proximal duodenum, possibly secondary to a perforated gastric ulcer along the posterior gastric antrum.   Associated moderate pneumoperitoneum.   Large volume abdominopelvic ascites.  SIGNIFICANT EVENTS: 9/25 - Transfer to [facility] from OSH 9/25 - Ex Lap & repair/patch of pre-pyloric perforated gastric ulcer  SUBJECTIVE: Patient received further IV albumin yesterday evening. No other acute events overnight. Hemoglobin this morning has decreased which could be due to some fluid shift, but given her recent surgical repair for perforated ulcer plan for further transfusions of blood products.  REVIEW OF SYSTEMS: Unobtainable as the patient is currently intubated.  VITAL SIGNS: Temp:  [98.9 F (37.2 C)-99.9 F (37.7 C)] 99.9 F (37.7 C) (09/27 0832) Pulse Rate:  [50-102] 95 (09/27 1000) Resp:  [10-19] 15 (09/27 1000) BP: (90-115)/(34-49) 90/48 mmHg (09/27 1000) SpO2:  [100 %] 100 % (09/27 1000) Arterial Line BP: (105-135)/(33-46) 111/36 mmHg (09/27 1000) FiO2 (%):  [35 %-40 %] 35 % (09/27 0800) Weight:  [133 lb 2.5 oz (60.4 kg)] 133 lb 2.5 oz (60.4 kg) (09/27 0500) HEMODYNAMICS: CVP:  [2 mmHg-8 mmHg] 8 mmHg VENTILATOR SETTINGS: Vent Mode:  [-] PSV FiO2 (%):  [35 %-40 %] 35 % Set Rate:  [15 bmp] 15 bmp Vt Set:  [460 mL] 460  mL PEEP:  [5 cmH20] 5 cmH20 Pressure Support:  [5 cmH20] 5 cmH20 Plateau Pressure:  [11 cmH20-13 cmH20] 13 cmH20 INTAKE / OUTPUT:  Intake/Output Summary (Last 24 hours) at 10/12/14 1010 Last data filed at 10/12/14 1000  Gross per 24 hour  Intake 5380.69 ml  Output   2210 ml  Net 3170.69 ml    PHYSICAL EXAMINATION: General:  Awake. No distress. On ventilator when examined. Integument:  Warm & dry. Abdominal dressing clean and dry. HEENT: Endotracheal tube in place. PERRL. No scleral injection.  Cardiovascular:  Regular rate. No edema. No appreciable JVD.  Pulmonary:  Decreased breath sounds bilateral lung bases. Normal work of breathing on pressure support 0/5. Symmetric chest wall rise. Abdomen: Soft. Hypoactivel bowel sounds. Abdominal dressing in place. Neurological: Patient following commands. Raises hands appropriately. Nods to questions.  LABS:  CBC  Recent Labs Lab 10/11/14 0037 10/11/14 0646 10/11/14 1110 10/12/14 0804  WBC 20.5* 22.3*  --  6.9  HGB 8.4* 7.9* 7.7* 6.1*  HCT 28.5* 27.2* 26.7* 20.7*  PLT 341 343  --  172   Coag's  Recent Labs Lab 10/10/14 1854 10/11/14 1110 10/12/14 0804  APTT 35 40* 45*  INR 1.49 1.95* 2.04*   BMET  Recent Labs Lab 10/11/14 0037 10/11/14 0646 10/12/14 0442  NA 134* 135 134*  K 3.8 3.7 2.7*  CL 108 113* 110  CO2 16* 14* 15*  BUN 24* 24* 25*  CREATININE 1.72* 1.74* 1.48*  GLUCOSE 118* 103* 113*   Electrolytes  Recent Labs Lab 10/10/14 1854 10/11/14 0037  10/11/14 0646 10/12/14 0442  CALCIUM 7.5* 6.7* 6.1* 5.8*  MG 1.5*  --  1.3* 1.9  PHOS 5.8*  --  5.6* 4.2   Sepsis Markers  Recent Labs Lab 10/10/14 1854  10/11/14 1111 10/11/14 1545 10/11/14 2146  LATICACIDVEN  --   < > 2.4* 1.6 1.4  PROCALCITON 25.82  --   --   --   --   < > = values in this interval not displayed. ABG  Recent Labs Lab 10/10/14 1909 10/11/14 0047 10/11/14 0546  PHART 7.334* 7.264* 7.271*  PCO2ART 29.2* 41.3 27.9*   PO2ART 62.0* 433.0* 148*   Liver Enzymes  Recent Labs Lab 10/10/14 0710 10/10/14 1854 10/12/14 0442  AST 60* 31 26  ALT 6* 9* 8*  ALKPHOS 97 68 53  BILITOT 1.4* 0.9 1.1  ALBUMIN 2.6* 1.9* 2.1*   Cardiac Enzymes  Recent Labs Lab 10/10/14 1854 10/11/14 0037 10/11/14 0646  TROPONINI <0.03 <0.03 <0.03   Glucose  Recent Labs Lab 10/10/14 1847  GLUCAP 103*    Imaging No results found.   ASSESSMENT / PLAN:  PULMONARY A: Chronic Tobacco Use  P:   Extubation today Supplemental oxygen to maintain saturation greater than 90% Ordering incentive spirometry  CARDIOVASCULAR CVL R IJ CVL ([place]) 9/25>>> A: Septic shock - improving  P:  Continue levophed and titrate as able for goal MAP & SBP Avoiding crystalloids  RENAL A: Acute Renal Failure - Improving after albumin infusion Hypokalemia - replacing Hypomagnesemia - resolved Mild Hyponatremia - resolved Hypocalcemia - replaced Lactic Acidosis - resolved  P:   Monitoring UOP with Foley Trending renal function w/ daily BUN/Creatinine Daily electrolytes BMP at 1400 hrs. today  GASTROINTESTINAL A: Perforated gastric ulcer - S/P surgical patch repair 9/25 ETOH cirrhosis  Ascites - ctx pending  P:   Trending hemoglobin daily Protonix IV twice a day  Surgery following Holding on by mouth for at least 24 hours  HEMATOLOGIC A: Anemia - secondary to cirrhosis & perforated gastric ulcer Coagulopathy - secondary to liver cirrhosis Leukocytosis - resolved  P:  Transfuse 1 unit PRBC  Transfuse 2 units FFP Vitamin K 5 mg IV  SCD's  Avoiding chemical DVT prophylaxis given ulcer Repeating hemoglobin/hematocrit & coags posttransfusion Trending hemoglobin with CBC daily Trending coags daily  INFECTIOUS A: Sepsis - Intra-abdominal Source PCN allergy   P:   Ascites 9/25>>> BCx4 9/25>>> UC 9/25>>>negative  Vanc 9/25>>> Aztreonam 9/25>>> Flagyl 9/25>>>  ENDOCRINE A: Mild Hyperglycemia    P:   Monitor glucose w/ daily BMP  NEUROLOGIC A: No active issue  P:   D/C Propofol drip postextubation  D/C Dilaudid postextubation  Fentanyl IV when necessary  FAMILY  - Updates:   Son updated at bedside today & daughter updated yesterday at bedside.  TODAY'S SUMMARY: 59 year old female with known underlying hepatic cirrhosis and abdominal ascites after transfer from outside hospital with perforated gastric ulcer. Status post patch repair on 9/25. Hypotension and urine output improving after IV albumin administration. Continuing broad-spectrum antibiotics for intra-abdominal infection while awaiting culture results. Successfully extubated today. Family updated at bedside.   I have spent a total of 33 minutes of critical care time today caring for the patient, planning her extubation with respiratory therapy, updating the patient's son at bedside, & reviewing the patient's electronic medical record.  Sonia Baller Ashok Cordia, M.D. [facility] Pulmonary & Critical Care Pager:  [phone] After 3pm or if no response, call [phone]  10/12/2014  10:10 AM

## 2014-10-12 NOTE — Progress Notes (Signed)
[facility] Progress Note Patient Name: [name] DOB: Jan 19, 1955 MRN: [id number]   Date of Service  10/12/2014  HPI/Events of Note  cvp 3, remains on high dose pressors  eICU Interventions  Saline bolus May need albumin     Intervention Category Major Interventions: Hypotension - evaluation and management  Raylene Miyamoto. 10/12/2014, 4:47 AM

## 2014-10-12 NOTE — Progress Notes (Addendum)
CRITICAL VALUE ALERT  Critical value received:  Hgb 6.1  Date of notification:  10/02/14  Time of notification:  0940  Critical value read back:Yes.    Nurse who received alert:  Oneita Jolly  MD notified (1st page):  Dr. Ashok Cordia  Time of first page:  [phone]  Responding MD:  Dr. Ashok Cordia  Time MD responded:  [phone]

## 2014-10-12 NOTE — Progress Notes (Signed)
[place] Progress Note Patient Name: [name] DOB: [birth year] MRN: [id number]   Date of Service  10/12/2014  HPI/Events of Note  SOB during infusion for FFP. BP = 146/57, RR = 28 and HR = 148. CVP = 16. Question transfusion reaction vs volume overload. Already on Protonix IV Q 12 hour.s  eICU Interventions  Will order: 1. Portable CXR now. 2. Benadryl 12.5 mg IV now and Q 6 hours PRN. 3. Albuterol Nebs 2.5 mg Q 4 hours PRN. 4. Lasix 20 mg IV now.  5. Solumedrol 40 mg IV now and Q 6 hours X 4 doses.  6. Send appropriate transfusion reaction studies to blood bank.      Intervention Category Intermediate Interventions: Respiratory distress - evaluation and management  Sommer,Steven Eugene 10/12/2014, 5:53 PM

## 2014-10-12 NOTE — Progress Notes (Signed)
[place] Progress Note Patient Name: [name] DOB: 1955/11/02 MRN: [id number]   Date of Service  10/12/2014  HPI/Events of Note  Low ca even with correction Alb Low k   eICU Interventions  supp k , ca     Intervention Category Major Interventions: Electrolyte abnormality - evaluation and management  Raylene Miyamoto. 10/12/2014, 6:52 AM

## 2014-10-12 NOTE — Progress Notes (Signed)
[place] Progress Note Patient Name: BRETTE CAST DOB: [birth year] MRN: [id number]   Date of Service  10/12/2014  HPI/Events of Note  K+ = 2.6, Creatinine = 1.33, Ca++ = 6.1 and Albumin = 2.1. Ca++ corrected for Albumin = 7.62.  eICU Interventions  Will replete K+ and Ca++.     Intervention Category Intermediate Interventions: Electrolyte abnormality - evaluation and management  Sommer,Steven Eugene 10/12/2014, 9:06 PM

## 2014-10-12 NOTE — Progress Notes (Signed)
[facility] Progress Note 2 Days Post-Op  Subjective: Patient doing well on the ventilator.  Should be able to extubate soon.  FIO2 .40, PS and pulloing Vt 700-900.  Patient is wide awake.  Still on Levophed  Objective: Vital signs in last 24 hours: Temp:  [98.9 F (37.2 C)-99.8 F (37.7 C)] 99.8 F (37.7 C) (09/27 0417) Pulse Rate:  [50-106] 92 (09/27 0753) Resp:  [10-19] 11 (09/27 0753) BP: (90-115)/(34-47) 115/37 mmHg (09/27 0753) SpO2:  [100 %] 100 % (09/27 0753) Arterial Line BP: (100-135)/(33-46) 122/39 mmHg (09/27 0700) FiO2 (%):  [35 %-40 %] 35 % (09/27 0750) Weight:  [60.4 kg (133 lb 2.5 oz)] 60.4 kg (133 lb 2.5 oz) (09/27 0500) Last BM Date: 10/09/14  Intake/Output from previous day: 09/26 0701 - 09/27 0700 In: 5228.9 [I.V.:3728.9; IV Piggyback:1500] Out: 2240 [Urine:1140; Emesis/NG output:1100] Intake/Output this shift:    General: Patient in no acute distress  Lungs: Clear  Abd: Soft, distended, good bowel sounds.  Extremities: No changes  Neuro: Intact  Lab Results:  @LABLAST2 (wbc:2,hgb:2,hct:2,plt:2) BMET ) Recent Labs  10/11/14 0646 10/12/14 0442  NA 135 134*  K 3.7 2.7*  CL 113* 110  CO2 14* 15*  GLUCOSE 103* 113*  BUN 24* 25*  CREATININE 1.74* 1.48*  CALCIUM 6.1* 5.8*   PT/INR  Recent Labs  10/10/14 1854 10/11/14 1110  LABPROT 18.1* 22.1*  INR 1.49 1.95*   ABG  Recent Labs  10/11/14 0047 10/11/14 0546  PHART 7.264* 7.271*  HCO3 18.7* 12.4*    Studies/Results: Ct Abdomen Pelvis Wo Contrast  10/10/2014   CLINICAL DATA:  Decrease abdominal pain, liver disease, ascites  EXAM: CT ABDOMEN AND PELVIS WITHOUT CONTRAST  TECHNIQUE: Multidetector CT imaging of the abdomen and pelvis was performed following the standard protocol without IV contrast.  COMPARISON:  Right upper quadrant ultrasound dated 05/21/2014  FINDINGS: Lower chest: Trace bilateral pleural effusions. Mild dependent atelectasis the lung bases.  Hepatobiliary: Cirrhotic  configuration of the liver.  Gallbladder is unremarkable. No intrahepatic or extrahepatic ductal dilatation.  Pancreas: Within normal limits.  Spleen: Splenomegaly.  Adrenals/Urinary Tract: Adrenal glands are within normal limits.  Right kidney is notable for three nonobstructing right renal calculi measuring up to 4 mm. 1.4 cm lateral right upper pole renal cyst (series 2/ image 44).  4 mm posterior interpolar left renal cyst (series 2/ image 41).  No hydronephrosis.  Bladder is underdistended but unremarkable.  Stomach/Bowel: Contrast within the stomach. Associated extraluminal contrast in the right mid abdomen (series 2/images 26 and 29), associated with the distal gastric antrum and proximal duodenum.  Focal contrast along the posterior aspect of the gastric antrum suggest a perforated gastric ulcer (series 2/image 33).  Scattered foci of free air with additional moderate pneumoperitoneum in the anterior abdomen (series 2/ images 41 and 62).  No evidence of bowel obstruction.  Appendix is not discretely visualized.  Mild wall thickening involving the right colon (series 2/ image 65), possibly secondary to ascites, infectious/ inflammatory colitis considered less likely but not excluded.  Colonic diverticulosis, without evidence of diverticulitis.  Vascular/Lymphatic: Atherosclerotic calcifications of the abdominal aorta and branch vessels.  No suspicious abdominopelvic lymphadenopathy.  Reproductive: Uterus is notable for multiple uterine fibroids, including a 4.6 cm calcified subserosal fundal fibroid (series 2/image 78).  No adnexal masses.  Other: Large volume abdominopelvic ascites.  Musculoskeletal: Degenerative changes of the visualized thoracolumbar spine, most prominent at L4-5.  IMPRESSION: Extraluminal contrast along the distal stomach/proximal duodenum, possibly secondary to a perforated gastric  ulcer along the posterior gastric antrum.  Associated moderate pneumoperitoneum.  Large volume  abdominopelvic ascites.  Additional ancillary findings as above.  Critical Value/emergent results were called by telephone at the time of interpretation on 10/10/2014 at 11:00 am to Dr. Carrie Mew, who verbally acknowledged these results.   Electronically Signed   By: Julian Hy M.D.   On: 10/10/2014 11:01   Dg Chest Portable 1 View  10/10/2014   CLINICAL DATA:  Placement of right internal jugular line.  EXAM: PORTABLE CHEST 1 VIEW  COMPARISON:  None.  FINDINGS: Right internal jugular central line is in place with the tip in the lower right atrium or possibly into the IVC. This is approximately 10 cm below the cavoatrial junction. Low lung volumes. Minimal bibasilar atelectasis. Heart is normal size. No acute bony abnormality.  IMPRESSION: Right internal jugular central line passes were well into the right atrium or possibly through the heart into the IVC, approximately 10 cm below the cavoatrial junction.  Low lung volumes, bibasilar atelectasis.   Electronically Signed   By: Rolm Baptise M.D.   On: 10/10/2014 13:07    Anti-infectives: Anti-infectives    Start     Dose/Rate Route Frequency Ordered Stop   10/11/14 2000  vancomycin (VANCOCIN) IVPB 1000 mg/200 mL premix     1,000 mg 200 mL/hr over 60 Minutes Intravenous Every 24 hours 10/10/14 1900     10/10/14 2000  metroNIDAZOLE (FLAGYL) IVPB 500 mg     500 mg 100 mL/hr over 60 Minutes Intravenous Every 8 hours 10/10/14 1900     10/10/14 2000  vancomycin (VANCOCIN) 1,500 mg in sodium chloride 0.9 % 500 mL IVPB     1,500 mg 250 mL/hr over 120 Minutes Intravenous  Once 10/10/14 1900 10/11/14 0040   10/10/14 2000  aztreonam (AZACTAM) 2 g in dextrose 5 % 50 mL IVPB     2 g 100 mL/hr over 30 Minutes Intravenous Every 12 hours 10/10/14 1900     10/10/14 1900  aztreonam (AZACTAM) 2 g in dextrose 5 % 50 mL IVPB  Status:  Discontinued     2 g 100 mL/hr over 30 Minutes Intravenous 4 times per day 10/10/14 1846 10/10/14 1900   10/10/14 1845   vancomycin (VANCOCIN) IVPB 1000 mg/200 mL premix  Status:  Discontinued     1,000 mg 200 mL/hr over 60 Minutes Intravenous Every 12 hours 10/10/14 1844 10/10/14 1900      Assessment/Plan: s/p Procedure(s): EXPLORATORY LAPAROTOMY, OMENTAL GRAHAM PATCHING OF PRE-PYLORIC GASTRIC ULCER, AND PRIMARY UMBILICAL HERNIA REPAIR Can be extubated from osurgical standpoint.  Abdomen is benign and completely nontender.  NGT output still too high to remove NGT or consider feeding.  Maybe tomorrow.  LOS: 2 days   Kathryne Eriksson. Dahlia Bailiff, MD, FACS [phone] [facility] 10/12/2014

## 2014-10-13 ENCOUNTER — Encounter (HOSPITAL_COMMUNITY): Payer: Self-pay | Admitting: *Deleted

## 2014-10-13 LAB — MAGNESIUM: Magnesium: 1.8 mg/dL (ref 1.7–2.4)

## 2014-10-13 LAB — COMPREHENSIVE METABOLIC PANEL
ALBUMIN: 1.9 g/dL — AB (ref 3.5–5.0)
ALK PHOS: 53 U/L (ref 38–126)
ALT: 13 U/L — AB (ref 14–54)
AST: 27 U/L (ref 15–41)
Anion gap: 10 (ref 5–15)
BUN: 24 mg/dL — ABNORMAL HIGH (ref 6–20)
CALCIUM: 6.5 mg/dL — AB (ref 8.9–10.3)
CO2: 13 mmol/L — AB (ref 22–32)
CREATININE: 1.36 mg/dL — AB (ref 0.44–1.00)
Chloride: 114 mmol/L — ABNORMAL HIGH (ref 101–111)
GFR calc Af Amer: 48 mL/min — ABNORMAL LOW (ref >60)
GFR calc non Af Amer: 41 mL/min — ABNORMAL LOW (ref >60)
GLUCOSE: 148 mg/dL — AB (ref 65–99)
Potassium: 3.4 mmol/L — ABNORMAL LOW (ref 3.5–5.1)
SODIUM: 137 mmol/L (ref 135–145)
Total Bilirubin: 1.4 mg/dL — ABNORMAL HIGH (ref 0.3–1.2)
Total Protein: 4.3 g/dL — ABNORMAL LOW (ref 6.5–8.1)

## 2014-10-13 LAB — CBC WITH DIFFERENTIAL/PLATELET
Basophils Absolute: 0 10*3/uL (ref 0.0–0.1)
Basophils Relative: 0 %
EOS PCT: 0 %
Eosinophils Absolute: 0 10*3/uL (ref 0.0–0.7)
HCT: 22.9 % — ABNORMAL LOW (ref 36.0–46.0)
Hemoglobin: 6.9 g/dL — CL (ref 12.0–15.0)
LYMPHS ABS: 0.3 10*3/uL — AB (ref 0.7–4.0)
LYMPHS PCT: 12 %
MCH: 23.8 pg — AB (ref 26.0–34.0)
MCHC: 30.1 g/dL (ref 30.0–36.0)
MCV: 79 fL (ref 78.0–100.0)
MONO ABS: 0.1 10*3/uL (ref 0.1–1.0)
Monocytes Relative: 4 %
Neutro Abs: 2.3 10*3/uL (ref 1.7–7.7)
Neutrophils Relative %: 84 %
PLATELETS: 91 10*3/uL — AB (ref 150–400)
RBC: 2.9 MIL/uL — AB (ref 3.87–5.11)
RDW: 17.9 % — ABNORMAL HIGH (ref 11.5–15.5)
WBC: 2.7 10*3/uL — AB (ref 4.0–10.5)

## 2014-10-13 LAB — PREPARE FRESH FROZEN PLASMA
UNIT DIVISION: 0
Unit division: 0

## 2014-10-13 LAB — PHOSPHORUS: Phosphorus: 3.8 mg/dL (ref 2.5–4.6)

## 2014-10-13 LAB — GLUCOSE, CAPILLARY: GLUCOSE-CAPILLARY: 147 mg/dL — AB (ref 65–99)

## 2014-10-13 LAB — TRANSFUSION REACTION
DAT C3: NEGATIVE
POST RXN DAT IGG: NEGATIVE

## 2014-10-13 MED ORDER — SODIUM CHLORIDE 0.9 % IV SOLN: Freq: Once | INTRAVENOUS | Status: DC

## 2014-10-13 MED ORDER — DEXTROSE 5 % IV SOLN
1.0000 g | Freq: Three times a day (TID) | INTRAVENOUS | Status: DC
  Administered 2014-10-13 – 2014-10-16 (×9): 1 g via INTRAVENOUS
  Filled 2014-10-13 (×11): qty 1

## 2014-10-13 MED ORDER — HYDROCORTISONE 2.5 % RE CREA
TOPICAL_CREAM | Freq: Three times a day (TID) | RECTAL | Status: DC
  Administered 2014-10-13: 23:00:00 via RECTAL
  Administered 2014-10-14: 1 via RECTAL
  Administered 2014-10-14 (×2): via RECTAL
  Administered 2014-10-15: 1 via RECTAL
  Administered 2014-10-15 – 2014-10-16 (×2): via RECTAL
  Filled 2014-10-13: qty 28.35

## 2014-10-13 NOTE — Progress Notes (Signed)
PULMONARY / CRITICAL CARE MEDICINE   Name: [name] MRN: [id number] DOB: 11-18-1955    ADMISSION DATE:  10/10/2014  REFERRING MD :  Selena Lesser   CHIEF COMPLAINT:  Bowel perforation   INITIAL PRESENTATION: 59yo female smoker with hx ETOH cirrhosis with worsening ascites requiring more frequent paracentesis with most recent on 9/15 with 10.5L removed.  She presented to [facility] hospital 9/25 with 2 day hx worsening abd pain and increased abd distension. She was hypotensive requiring pressors and CT abd showed free air.  She was tx to [facility] 9/25 for surgical intervention and ICU mgmt.   STUDIES:  CT abd/pelvis 9/25 - Extraluminal contrast along the distal stomach/proximal duodenum, possibly secondary to a perforated gastric ulcer along the posterior gastric antrum.   Associated moderate pneumoperitoneum.   Large volume abdominopelvic ascites.  SIGNIFICANT EVENTS: 9/25 - Transfer to [facility] from OSH 9/25 - Ex Lap & repair/patch of pre-pyloric perforated gastric ulcer 9/27 - Extubated 9/28 - Off vasopressor in AM & starting diet  SUBJECTIVE: Patient did have some dyspnea/hypoxia with transfusion yesterday afternoon. Denies any dyspnea currently on nasal cannula oxygen. Denies any chest pain or pressure.  REVIEW OF SYSTEMS: Denies any nausea or vomiting. Denies any abdominal pain. No subjective fever, chills, or sweats.  VITAL SIGNS: Temp:  [97.8 F (36.6 C)-99.5 F (37.5 C)] 97.8 F (36.6 C) (09/28 1215) Pulse Rate:  [43-150] 71 (09/28 1200) Resp:  [13-36] 20 (09/28 1200) BP: (83-126)/(40-98) 95/47 mmHg (09/28 1200) SpO2:  [89 %-100 %] 100 % (09/28 1200) Arterial Line BP: (91-141)/(31-58) 92/39 mmHg (09/28 1200) Weight:  [128 lb 12 oz (58.4 kg)] 128 lb 12 oz (58.4 kg) (09/28 0449) HEMODYNAMICS: CVP:  [6 mmHg-10 mmHg] 7 mmHg VENTILATOR SETTINGS:   INTAKE / OUTPUT:  Intake/Output Summary (Last 24 hours) at 10/13/14 1235 Last data filed at 10/13/14 1200  Gross per 24 hour   Intake 2718.27 ml  Output   3250 ml  Net -531.73 ml    PHYSICAL EXAMINATION: General:  Alert. Laying in bed. No acute distress. Integument:  Warm & dry. Abdominal dressing clean and dry. HEENT: Moist mucous membranes. PERRL. No scleral injection.  Cardiovascular:  Regular rate. No appreciable JVD.  Pulmonary:  Continues to have decreased breath sounds bilateral lung bases. Normal work of breathing on nasal cannula oxygen. Abdomen: Soft. Hypoactivel bowel sounds. Abdominal dressing in place. Nontender. Neurological: Following commands. Grossly nonfocal. No meningismus. LABS:  CBC  Recent Labs Lab 10/12/14 0804 10/12/14 2009 10/13/14 0500  WBC 6.9 3.2* 2.7*  HGB 6.1* 7.0* 6.9*  HCT 20.7* 23.0* 22.9*  PLT 172 113* 91*   Coag's  Recent Labs Lab 10/11/14 1110 10/12/14 0804 10/12/14 2009  APTT 40* 45* 41*  INR 1.95* 2.04* 1.87*   BMET  Recent Labs Lab 10/12/14 0442 10/12/14 2009 10/13/14 0500  NA 134* 137 137  K 2.7* 2.6* 3.4*  CL 110 112* 114*  CO2 15* 13* 13*  BUN 25* 21* 24*  CREATININE 1.48* 1.33* 1.36*  GLUCOSE 113* 95 148*   Electrolytes  Recent Labs Lab 10/11/14 0646 10/12/14 0442 10/12/14 2009 10/13/14 0500  CALCIUM 6.1* 5.8* 6.1* 6.5*  MG 1.3* 1.9  --  1.8  PHOS 5.6* 4.2  --  3.8   Sepsis Markers  Recent Labs Lab 10/10/14 1854  10/11/14 1111 10/11/14 1545 10/11/14 2146  LATICACIDVEN  --   < > 2.4* 1.6 1.4  PROCALCITON 25.82  --   --   --   --   < > =  values in this interval not displayed. ABG  Recent Labs Lab 10/10/14 1909 10/11/14 0047 10/11/14 0546  PHART 7.334* 7.264* 7.271*  PCO2ART 29.2* 41.3 27.9*  PO2ART 62.0* 433.0* 148*   Liver Enzymes  Recent Labs Lab 10/10/14 1854 10/12/14 0442 10/13/14 0500  AST 31 26 27   ALT 9* 8* 13*  ALKPHOS 68 53 53  BILITOT 0.9 1.1 1.4*  ALBUMIN 1.9* 2.1* 1.9*   Cardiac Enzymes  Recent Labs Lab 10/10/14 1854 10/11/14 0037 10/11/14 0646  TROPONINI <0.03 <0.03 <0.03    Glucose  Recent Labs Lab 10/10/14 1847 10/11/14 1953 10/13/14 0015  GLUCAP 103* 111* 147*    Imaging Dg Chest Port 1 View  10/12/2014   CLINICAL DATA:  Shortness of breath, onset 15 minutes prior.  EXAM: PORTABLE CHEST 1 VIEW  COMPARISON:  10/10/2014  FINDINGS: Tip of the right central line in the region of the right atrium. Enteric tube in place, tip below the diaphragm not included in the field of view. Low lung volumes persist. There is bibasilar atelectasis. No consolidation to suggest pneumonia. Cardiomediastinal contours are normal. No large pleural effusion or pneumothorax. Deformity of the distal left clavicle again seen.  IMPRESSION: 1. Tip of the right central line in the region of the right atrium. 2. Low lung volumes with bibasilar atelectasis. The degree of atelectasis has increased from prior.   Electronically Signed   By: Jeb Levering M.D.   On: 10/12/2014 18:17     ASSESSMENT / PLAN:  PULMONARY A: Chronic Tobacco Use Mild TRALI vs Pulmonary Edema - with transfusion  P:   Supplemental oxygen to maintain saturation greater than 90% Continuing incentive spirometry Solu-Medrol for 4 doses  CARDIOVASCULAR CVL R IJ CVL ([place]) 9/25>>> A: Septic shock - resolved  P:  Avoiding crystalloids Discontinuing radial arterial line  RENAL A: Acute Renal Failure - Improving  Hypokalemia - resolved Hypomagnesemia - resolved Mild Hyponatremia - resolved Hypocalcemia - replaced Lactic Acidosis - resolved  P:   Monitoring UOP with Foley Trending renal function w/ daily BUN/Creatinine Daily electrolytes  GASTROINTESTINAL A: Perforated gastric ulcer - S/P surgical patch repair 9/25 ETOH cirrhosis  Ascites - ctx pending  P:   Trending hemoglobin daily Protonix IV twice a day  Surgery following Starting clear liquid diet  HEMATOLOGIC A: Anemia - secondary to cirrhosis & perforated gastric ulcer Coagulopathy - stable. secondary to liver  cirrhosis Leukocytosis - resolved  P:  SCD's  Trending hemoglobin with CBC daily Trending coags daily  INFECTIOUS A: Sepsis - Intra-abdominal Source PCN allergy   P:   Ascites 9/25>>> BCx4 9/25>>> UC 9/25>>>negative  Vanc 9/25>>> Aztreonam 9/25>>> Flagyl 9/25>>>  ENDOCRINE A: Mild Hyperglycemia   P:   Monitor glucose w/ daily BMP  NEUROLOGIC A: No active issue  P:   Fentanyl IV when necessary for pain relief  FAMILY  - Updates:   No family at bedside for update today.  TODAY'S SUMMARY: 59 year old female with known underlying hepatic cirrhosis and abdominal ascites after transfer from outside hospital with perforated gastric ulcer. Status post patch repair on 9/25. Acute renal failure improving. Patient weaned off of vasopressor support this morning. Could consider transfer to stepdown unit or telemetry later this afternoon if she continues to remain stable.  I have spent a total of 32 minutes of critical care time today caring for the patient & reviewing the patient's electronic medical record.  Sonia Baller Ashok Cordia, M.D. [facility] Pager:  [phone] After 3pm or if  no response, call [phone]  10/13/2014  12:35 PM

## 2014-10-13 NOTE — Progress Notes (Addendum)
[facility] Progress Note Patient Name: [name] DOB: 1955-01-21 MRN: [id number]   Date of Service  10/13/2014  HPI/Events of Note  anemia  eICU Interventions  1 unit possible, am doc to assess  As Had prior tx ffp reaction on this sdtay Pre treat if blood decided, will allow am doc to assess BP is better asymptomatic     Intervention Category Intermediate Interventions: Diagnostic test evaluation Minor Interventions: Agitation / anxiety - evaluation and management  Raylene Miyamoto. 10/13/2014, 6:23 AM

## 2014-10-13 NOTE — Progress Notes (Signed)
[facility] Progress Note Patient Name: [name] DOB: May 11, 1955 MRN: [id number]   Date of Service  10/13/2014  HPI/Events of Note  Patient c/o hemarhoids.    eICU Interventions  Will order Anusol cream to rectum TID     Intervention Category Minor Interventions: Routine modifications to care plan (e.g. PRN medications for pain, fever)  Sommer,Steven Eugene 10/13/2014, 6:52 PM

## 2014-10-13 NOTE — Progress Notes (Signed)
A-line removed. A-line pressure at removal: 114/52 (MAP 73) BP cuff reading at removal: 95/56 (MAP 65). Approximately 10 point difference in a-line and BP cuff pressure. MD aware of discrepancy and is okay with using cuff pressure.

## 2014-10-13 NOTE — Progress Notes (Signed)
[facility] Progress Note 3 Days Post-Op  Subjective: Patient doing great.  Minimal abdominal pain.  Off pressors since this AM.  Objective: Vital signs in last 24 hours: Temp:  [97.8 F (36.6 C)-99.5 F (37.5 C)] 97.8 F (36.6 C) (09/28 0822) Pulse Rate:  [43-150] 76 (09/28 0815) Resp:  [12-36] 19 (09/28 0815) BP: (83-126)/(40-98) 85/50 mmHg (09/28 0800) SpO2:  [89 %-100 %] 100 % (09/28 0815) Arterial Line BP: (91-141)/(31-58) 107/46 mmHg (09/28 0815) Weight:  [58.4 kg (128 lb 12 oz)] 58.4 kg (128 lb 12 oz) (09/28 0449) Last BM Date: 10/13/14  Intake/Output from previous day: 09/27 0701 - 09/28 0700 In: 2660 [I.V.:610; Blood:800; IV Piggyback:1250] Out: 3330 [Urine:2830; Emesis/NG output:500] Intake/Output this shift: Total I/O In: 10 [I.V.:10] Out: 60 [Urine:60]  General: No distress  Lungs: Clear  Abd: Good bowel sounds.  Soft and not tender.  NGT output yesterday only 500cc.  Extremities: No changes  Neuro: Intact  Lab Results:  @LABLAST2 (wbc:2,hgb:2,hct:2,plt:2) BMET ) Recent Labs  10/12/14 2009 10/13/14 0500  NA 137 137  K 2.6* 3.4*  CL 112* 114*  CO2 13* 13*  GLUCOSE 95 148*  BUN 21* 24*  CREATININE 1.33* 1.36*  CALCIUM 6.1* 6.5*   PT/INR  Recent Labs  10/12/14 0804 10/12/14 2009  LABPROT 22.9* 21.5*  INR 2.04* 1.87*   ABG  Recent Labs  10/11/14 0047 10/11/14 0546  PHART 7.264* 7.271*  HCO3 18.7* 12.4*    Studies/Results: Dg Chest Port 1 View  10/12/2014   CLINICAL DATA:  Shortness of breath, onset 15 minutes prior.  EXAM: PORTABLE CHEST 1 VIEW  COMPARISON:  10/10/2014  FINDINGS: Tip of the right central line in the region of the right atrium. Enteric tube in place, tip below the diaphragm not included in the field of view. Low lung volumes persist. There is bibasilar atelectasis. No consolidation to suggest pneumonia. Cardiomediastinal contours are normal. No large pleural effusion or pneumothorax. Deformity of the distal left clavicle  again seen.  IMPRESSION: 1. Tip of the right central line in the region of the right atrium. 2. Low lung volumes with bibasilar atelectasis. The degree of atelectasis has increased from prior.   Electronically Signed   By: Jeb Levering M.D.   On: 10/12/2014 18:17    Anti-infectives: Anti-infectives    Start     Dose/Rate Route Frequency Ordered Stop   10/11/14 2000  vancomycin (VANCOCIN) IVPB 1000 mg/200 mL premix     1,000 mg 200 mL/hr over 60 Minutes Intravenous Every 24 hours 10/10/14 1900     10/10/14 2000  metroNIDAZOLE (FLAGYL) IVPB 500 mg     500 mg 100 mL/hr over 60 Minutes Intravenous Every 8 hours 10/10/14 1900     10/10/14 2000  vancomycin (VANCOCIN) 1,500 mg in sodium chloride 0.9 % 500 mL IVPB     1,500 mg 250 mL/hr over 120 Minutes Intravenous  Once 10/10/14 1900 10/11/14 0040   10/10/14 2000  aztreonam (AZACTAM) 2 g in dextrose 5 % 50 mL IVPB     2 g 100 mL/hr over 30 Minutes Intravenous Every 12 hours 10/10/14 1900     10/10/14 1900  aztreonam (AZACTAM) 2 g in dextrose 5 % 50 mL IVPB  Status:  Discontinued     2 g 100 mL/hr over 30 Minutes Intravenous 4 times per day 10/10/14 1846 10/10/14 1900   10/10/14 1845  vancomycin (VANCOCIN) IVPB 1000 mg/200 mL premix  Status:  Discontinued     1,000 mg 200 mL/hr  over 60 Minutes Intravenous Every 12 hours 10/10/14 1844 10/10/14 1900      Assessment/Plan: s/p Procedure(s): EXPLORATORY LAPAROTOMY, OMENTAL GRAHAM PATCHING OF PRE-PYLORIC GASTRIC ULCER, AND PRIMARY UMBILICAL HERNIA REPAIR Advance diet clamp NGT  LOS: 3 days   Kathryne Eriksson. Dahlia Bailiff, MD, FACS [phone] [facility] 10/13/2014

## 2014-10-13 NOTE — Consult Note (Signed)
ANTIBIOTIC CONSULT NOTE - FOLLOW UP  Pharmacy Consult for vancomycin, Aztreonam  Indication: peritonitis  Allergies  Allergen Reactions  . Penicillins Hives    Has patient had a PCN reaction causing immediate rash, facial/tongue/throat swelling, SOB or lightheadedness with hypotension: Yes Has patient had a PCN reaction causing severe rash involving mucus membranes or skin necrosis: No Has patient had a PCN reaction that required hospitalization No Has patient had a PCN reaction occurring within the last 10 years: No If all of the above answers are "NO", then may proceed with Cephalosporin use.    Patient Measurements: Height: 5\' 5"  (165.1 cm) Weight: 128 lb 12 oz (58.4 kg) IBW/kg (Calculated) : 57  Vital Signs: Temp: 97.8 F (36.6 C) (09/28 0822) Temp Source: Oral (09/28 0822) BP: 85/50 mmHg (09/28 0800) Pulse Rate: 75 (09/28 0830) Intake/Output from previous day: 09/27 0701 - 09/28 0700 In: 2660 [I.V.:610; Blood:800; IV Piggyback:1250] Out: 3330 [Urine:2830; Emesis/NG output:500] Intake/Output from this shift: Total I/O In: 20 [I.V.:20] Out: 110 [Urine:60; Emesis/NG output:50]  Labs:  Recent Labs  10/12/14 0442 10/12/14 0804 10/12/14 2009 10/13/14 0500  WBC  --  6.9 3.2* 2.7*  HGB  --  6.1* 7.0* 6.9*  PLT  --  172 113* 91*  CREATININE 1.48*  --  1.33* 1.36*   Estimated Creatinine Clearance: 39.6 mL/min (by C-G formula based on Cr of 1.36). No results for input(s): VANCOTROUGH, VANCOPEAK, VANCORANDOM, GENTTROUGH, GENTPEAK, GENTRANDOM, TOBRATROUGH, TOBRAPEAK, TOBRARND, AMIKACINPEAK, AMIKACINTROU, AMIKACIN in the last 72 hours.   Microbiology: Recent Results (from the past 720 hour(s))  Body fluid culture     Status: None (Preliminary result)   Collection Time: 10/10/14  8:09 AM  Result Value Ref Range Status   Specimen Description ASCITIES  Final   Special Requests NONE  Final   Gram Stain FEW WBC SEEN NO ORGANISMS SEEN   Final   Culture NO GROWTH 2 DAYS   Final   Report Status PENDING  Incomplete  Culture, blood (routine x 2)     Status: None (Preliminary result)   Collection Time: 10/10/14  8:40 AM  Result Value Ref Range Status   Specimen Description BLOOD RIGHT HAND  Final   Special Requests BOTTLES DRAWN AEROBIC AND ANAEROBIC  1CC  Final   Culture NO GROWTH 3 DAYS  Final   Report Status PENDING  Incomplete  Culture, blood (routine x 2)     Status: None (Preliminary result)   Collection Time: 10/10/14  8:46 AM  Result Value Ref Range Status   Specimen Description BLOOD LEFT AC  Final   Special Requests BOTTLES DRAWN AEROBIC AND ANAEROBIC  2CC  Final   Culture NO GROWTH 3 DAYS  Final   Report Status PENDING  Incomplete  Urine culture     Status: None   Collection Time: 10/10/14  6:52 PM  Result Value Ref Range Status   Specimen Description URINE, CATHETERIZED  Final   Special Requests NONE  Final   Culture NO GROWTH 1 DAY  Final   Report Status 10/11/2014 FINAL  Final  Culture, blood (routine x 2)     Status: None (Preliminary result)   Collection Time: 10/10/14  7:23 PM  Result Value Ref Range Status   Specimen Description BLOOD RIGHT HAND  Final   Special Requests BOTTLES DRAWN AEROBIC AND ANAEROBIC 5CC  Final   Culture NO GROWTH 2 DAYS  Final   Report Status PENDING  Incomplete  Culture, blood (routine x 2)  Status: None (Preliminary result)   Collection Time: 10/10/14  7:30 PM  Result Value Ref Range Status   Specimen Description BLOOD LEFT HAND  Final   Special Requests IN PEDIATRIC BOTTLE 2CC  Final   Culture NO GROWTH 2 DAYS  Final   Report Status PENDING  Incomplete  MRSA PCR Screening     Status: Abnormal   Collection Time: 10/10/14  7:42 PM  Result Value Ref Range Status   MRSA by PCR POSITIVE (A) NEGATIVE Final    Comment:        The GeneXpert MRSA Assay (FDA approved for NASAL specimens only), is one component of a comprehensive MRSA colonization surveillance program. It is not intended to diagnose  MRSA infection nor to guide or monitor treatment for MRSA infections. RESULT CALLED TO, READ BACK BY AND VERIFIED WITH: FLYNT,F RN 2158 10/10/14 MITCHELL,L     Medical History: Past Medical History  Diagnosis Date  . Cirrhosis of liver   . Hemorrhoid    Assessment: 59 yo female who presented with ascites and abdominal pain currently on D#3 of empiric IV antibiotics for peritonitis. CT showed a perforated gastric ulcer. She is s/p ex lap with omental graham patching of pre-pyloric gastric ulcer. WBC is down to 2.7 today. Pt is afebrile. CrCl improved to ~ 40 mL/min   Aztreonam 9/25>> Vancomycin 9/25>> Metronidazole 9/25>>  9/25 Urine>>NegF 9/25 MRSA PCR 9/25 Blood x 2>>NGTD   Goal of Therapy:  Vancomycin trough level 15-20 mcg/ml  Plan:  Continue Vancomycin 1000 mg q24h Increase Aztreonam to 1 g q8h Metronidazole 500 mg q8h Monitor clinical status, renal fx, cultures, and VT prn  Albertina Parr, PharmD., BCPS Clinical Pharmacist Pager [phone]

## 2014-10-13 NOTE — Progress Notes (Signed)
CRITICAL VALUE ALERT  Critical value received:  Hb: 6.9  Date of notification:  10/13/14  Time of notification:  6720  Critical value read back:Yes.    Nurse who received alert:  Corinda Gubler  MD notified (1st page):  Warren Lacy MD  Time of first page:  0622  Responding MD:  Dr. Titus Mould  Time MD responded:  [phone]

## 2014-10-14 DIAGNOSIS — F1010 Alcohol abuse, uncomplicated: Secondary | ICD-10-CM

## 2014-10-14 LAB — TYPE AND SCREEN
ABO/RH(D): O POS
ANTIBODY SCREEN: NEGATIVE
UNIT DIVISION: 0
UNIT DIVISION: 0

## 2014-10-14 LAB — COMPREHENSIVE METABOLIC PANEL
ALK PHOS: 59 U/L (ref 38–126)
ALT: 13 U/L — AB (ref 14–54)
ANION GAP: 9 (ref 5–15)
AST: 25 U/L (ref 15–41)
Albumin: 1.8 g/dL — ABNORMAL LOW (ref 3.5–5.0)
BILIRUBIN TOTAL: 0.8 mg/dL (ref 0.3–1.2)
BUN: 32 mg/dL — ABNORMAL HIGH (ref 6–20)
CALCIUM: 6.7 mg/dL — AB (ref 8.9–10.3)
CO2: 16 mmol/L — ABNORMAL LOW (ref 22–32)
Chloride: 112 mmol/L — ABNORMAL HIGH (ref 101–111)
Creatinine, Ser: 1.19 mg/dL — ABNORMAL HIGH (ref 0.44–1.00)
GFR calc non Af Amer: 49 mL/min — ABNORMAL LOW (ref >60)
GFR, EST AFRICAN AMERICAN: 56 mL/min — AB (ref >60)
GLUCOSE: 195 mg/dL — AB (ref 65–99)
Potassium: 3.2 mmol/L — ABNORMAL LOW (ref 3.5–5.1)
Sodium: 137 mmol/L (ref 135–145)
TOTAL PROTEIN: 4.6 g/dL — AB (ref 6.5–8.1)

## 2014-10-14 LAB — CBC WITH DIFFERENTIAL/PLATELET
Basophils Absolute: 0 10*3/uL (ref 0.0–0.1)
Basophils Relative: 0 %
EOS ABS: 0 10*3/uL (ref 0.0–0.7)
EOS PCT: 0 %
HCT: 25.3 % — ABNORMAL LOW (ref 36.0–46.0)
Hemoglobin: 7.6 g/dL — ABNORMAL LOW (ref 12.0–15.0)
LYMPHS PCT: 8 %
Lymphs Abs: 0.4 10*3/uL — ABNORMAL LOW (ref 0.7–4.0)
MCH: 23.5 pg — AB (ref 26.0–34.0)
MCHC: 30 g/dL (ref 30.0–36.0)
MCV: 78.3 fL (ref 78.0–100.0)
MONO ABS: 0.3 10*3/uL (ref 0.1–1.0)
MONOS PCT: 6 %
Neutro Abs: 3.9 10*3/uL (ref 1.7–7.7)
Neutrophils Relative %: 86 %
PLATELETS: 97 10*3/uL — AB (ref 150–400)
RBC: 3.23 MIL/uL — AB (ref 3.87–5.11)
RDW: 18.3 % — AB (ref 11.5–15.5)
WBC: 4.6 10*3/uL (ref 4.0–10.5)

## 2014-10-14 LAB — VANCOMYCIN, TROUGH: VANCOMYCIN TR: 27 ug/mL — AB (ref 10.0–20.0)

## 2014-10-14 LAB — BODY FLUID CULTURE: CULTURE: NO GROWTH

## 2014-10-14 LAB — PHOSPHORUS: Phosphorus: 2.6 mg/dL (ref 2.5–4.6)

## 2014-10-14 LAB — MAGNESIUM: Magnesium: 2 mg/dL (ref 1.7–2.4)

## 2014-10-14 MED ORDER — VANCOMYCIN HCL IN DEXTROSE 750-5 MG/150ML-% IV SOLN
750.0000 mg | INTRAVENOUS | Status: DC
  Administered 2014-10-14: 750 mg via INTRAVENOUS
  Filled 2014-10-14: qty 150

## 2014-10-14 MED ORDER — OXYCODONE HCL 5 MG PO TABS
5.0000 mg | ORAL_TABLET | ORAL | Status: DC | PRN
  Administered 2014-10-14 – 2014-10-15 (×5): 10 mg via ORAL
  Administered 2014-10-16 (×2): 5 mg via ORAL
  Filled 2014-10-14: qty 2
  Filled 2014-10-14: qty 1
  Filled 2014-10-14 (×5): qty 2

## 2014-10-14 MED ORDER — POTASSIUM CHLORIDE 10 MEQ/50ML IV SOLN
10.0000 meq | INTRAVENOUS | Status: AC
  Administered 2014-10-14 (×4): 10 meq via INTRAVENOUS
  Filled 2014-10-14 (×4): qty 50

## 2014-10-14 NOTE — Progress Notes (Addendum)
PULMONARY / CRITICAL CARE MEDICINE   Name: [name] MRN: [id number] DOB: [birth year]    ADMISSION DATE:  10/10/2014  REFERRING MD :  Selena Lesser   CHIEF COMPLAINT:  Bowel perforation   INITIAL PRESENTATION: 59yo female smoker with hx ETOH cirrhosis with worsening ascites requiring more frequent paracentesis with most recent on 9/15 with 10.5L removed.  She presented to [facility] hospital 9/25 with 2 day hx worsening abd pain and increased abd distension. She was hypotensive requiring pressors and CT abd showed free air.  She was tx to [facility] 9/25 for surgical intervention and ICU mgmt.   STUDIES:  CT abd/pelvis 9/25 - Extraluminal contrast along the distal stomach/proximal duodenum, possibly secondary to a perforated gastric ulcer along the posterior gastric antrum.   Associated moderate pneumoperitoneum.   Large volume abdominopelvic ascites.  SIGNIFICANT EVENTS: 9/25 - Transfer to [facility] from OSH 9/25 - Ex Lap & repair/patch of pre-pyloric perforated gastric ulcer 9/27 - Extubated 9/28 - Off vasopressor in AM & starting diet  SUBJECTIVE: No acute events overnight. Denies any dyspnea. Denies any cough. Denies any abdominal pain, nausea, vomiting. Tolerating diet.  REVIEW OF SYSTEMS: No subjective fever, chills, or sweats. No chest pain or pressure.  VITAL SIGNS: Temp:  [97.1 F (36.2 C)-98.1 F (36.7 C)] 97.1 F (36.2 C) (09/29 0842) Pulse Rate:  [71-104] 74 (09/29 0900) Resp:  [15-30] 25 (09/29 0900) BP: (86-108)/(47-71) 108/68 mmHg (09/29 0900) SpO2:  [96 %-100 %] 100 % (09/29 0900) Arterial Line BP: (92-119)/(39-54) 119/54 mmHg (09/28 1500) Weight:  [134 lb 11.2 oz (61.1 kg)] 134 lb 11.2 oz (61.1 kg) (09/29 0500) HEMODYNAMICS:   VENTILATOR SETTINGS:   INTAKE / OUTPUT:  Intake/Output Summary (Last 24 hours) at 10/14/14 1005 Last data filed at 10/14/14 0925  Gross per 24 hour  Intake   1260 ml  Output    820 ml  Net    440 ml    PHYSICAL EXAMINATION: General:   Alert. Laying in bed. Breakfast tray in front of her. Integument:  Warm & dry. Abdominal dressing clean and dry. HEENT: Moist mucous membranes. PERRL. No scleral injection.  Cardiovascular:  Regular rate. No appreciable JVD.  Pulmonary:  Improving aeration bilateral lung bases. Normal work of breathing on room air. Abdomen: Soft. Protuberant. Abdominal dressing in place clean & dry. Nontender. Neurological: Following commands. Grossly nonfocal. No meningismus. LABS:  CBC  Recent Labs Lab 10/12/14 2009 10/13/14 0500 10/14/14 0505  WBC 3.2* 2.7* 4.6  HGB 7.0* 6.9* 7.6*  HCT 23.0* 22.9* 25.3*  PLT 113* 91* 97*   Coag's  Recent Labs Lab 10/11/14 1110 10/12/14 0804 10/12/14 2009  APTT 40* 45* 41*  INR 1.95* 2.04* 1.87*   BMET  Recent Labs Lab 10/12/14 2009 10/13/14 0500 10/14/14 0505  NA 137 137 137  K 2.6* 3.4* 3.2*  CL 112* 114* 112*  CO2 13* 13* 16*  BUN 21* 24* 32*  CREATININE 1.33* 1.36* 1.19*  GLUCOSE 95 148* 195*   Electrolytes  Recent Labs Lab 10/12/14 0442 10/12/14 2009 10/13/14 0500 10/14/14 0505  CALCIUM 5.8* 6.1* 6.5* 6.7*  MG 1.9  --  1.8 2.0  PHOS 4.2  --  3.8 2.6   Sepsis Markers  Recent Labs Lab 10/10/14 1854  10/11/14 1111 10/11/14 1545 10/11/14 2146  LATICACIDVEN  --   < > 2.4* 1.6 1.4  PROCALCITON 25.82  --   --   --   --   < > = values in this interval not displayed.  ABG  Recent Labs Lab 10/10/14 1909 10/11/14 0047 10/11/14 0546  PHART 7.334* 7.264* 7.271*  PCO2ART 29.2* 41.3 27.9*  PO2ART 62.0* 433.0* 148*   Liver Enzymes  Recent Labs Lab 10/12/14 0442 10/13/14 0500 10/14/14 0505  AST 26 27 25   ALT 8* 13* 13*  ALKPHOS 53 53 59  BILITOT 1.1 1.4* 0.8  ALBUMIN 2.1* 1.9* 1.8*   Cardiac Enzymes  Recent Labs Lab 10/10/14 1854 10/11/14 0037 10/11/14 0646  TROPONINI <0.03 <0.03 <0.03   Glucose  Recent Labs Lab 10/10/14 1847 10/11/14 1953 10/13/14 0015  GLUCAP 103* 111* 147*    Imaging No results  found.   ASSESSMENT / PLAN:  PULMONARY A: Chronic Tobacco Use Mild TRALI vs Pulmonary Edema - with transfusion. Improving. Acute Hypoxic Respiratory Failure - resolved.  P:   Supplemental oxygen to maintain saturation greater than 90% as needed Continuing incentive spirometry Solu-Medrol for 4 doses - complete  CARDIOVASCULAR CVL R IJ CVL ([place]) 9/25>>> A: Septic shock - resolved  P:  Avoiding crystalloids Monitor on telemetry  RENAL A: Acute Renal Failure - Improving  Hypokalemia - Replacing  Hypomagnesemia - resolved Mild Hyponatremia - resolved Hypocalcemia - replaced Lactic Acidosis - resolved  P:   Removing foley catheter Trending renal function w/ daily BUN/Creatinine Daily electrolytes Replacing KCl IV  GASTROINTESTINAL A: Perforated gastric ulcer - S/P surgical patch repair 9/25 ETOH cirrhosis  Ascites - ctx pending  P:   Trending hemoglobin daily Protonix IV twice a day  Surgery following Advancing diet per surgery recommendations  HEMATOLOGIC A: Anemia - secondary to cirrhosis & perforated gastric ulcer Coagulopathy - stable. secondary to liver cirrhosis Leukocytosis - resolved  P:  SCD's  Trending hemoglobin with CBC daily Trending coags daily  INFECTIOUS A: Sepsis - Intra-abdominal Source PCN allergy   P:   Ascites 9/25>>> BCx4 9/25>>> UC 9/25>>>negative  Vanc 9/25>>> Aztreonam 9/25>>> Flagyl 9/25>>>  ENDOCRINE A: Mild Hyperglycemia - also probable contribution from now completed steroids  P:   Starting SSI  Accuchecks qAC & HS  NEUROLOGIC A: No active issue. Not encephalopathic. Chronic EtOH use.  P:   D/C Fentanyl. Oxycodone po prn moderate-severe pain Monitor for signs of EtOH withdrawal.  FAMILY  - Updates:   No family at bedside for update today.  TODAY'S SUMMARY: 59 year old female with known underlying hepatic cirrhosis and abdominal ascites after transfer from outside hospital with perforated  gastric ulcer. Status post patch repair on 9/25. Acute renal failure improving. Weaned off of vasopressors in the morning of 9/28. Tolerating diet. Consulting PT/OT for evaluation and recommendations. Discussed need for complete alcohol cessation with the patient today. Transitioning patient to telemetry bed for further care. Hospitalist will assume care on 9/30 & PCCM will sign off.  Sonia Baller Ashok Cordia, M.D. [facility] Pulmonary & Critical Care Pager:  [phone] After 3pm or if no response, call [phone]  10/14/2014  10:05 AM

## 2014-10-14 NOTE — Progress Notes (Signed)
[facility] ADULT ICU REPLACEMENT PROTOCOL FOR AM LAB REPLACEMENT ONLY  The patient does apply for the [facility] Adult ICU Electrolyte Replacment Protocol based on the criteria listed below:   1. Is GFR >/= 40 ml/min? Yes.    Patient's GFR today is 49 2. Is urine output >/= 0.5 ml/kg/hr for the last 6 hours? Yes.   Patient's UOP is 1.05 ml/kg/hr 3. Is BUN < 60 mg/dL? Yes.    Patient's BUN today is 32 4. Abnormal electrolyte  K 3.2 5. Ordered repletion with: per protocol 6. If a panic level lab has been reported, has the CCM MD in charge been notified? Yes.  .   Physician:  Pietro Cassis 10/14/2014 6:27 AM

## 2014-10-14 NOTE — Progress Notes (Signed)
Patient ID: [name], female   DOB: 02/27/1955, 59 y.o.   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  Had a small BM.  Passing flatus.  Afebrile.  BP is soft. Levo weaned off.  Normal WBC.   Objective:  Vital signs:  Filed Vitals:   10/14/14 0400 10/14/14 0403 10/14/14 0500 10/14/14 0600  BP: 90/49  102/61 103/59  Pulse: 73  77 74  Temp:  97.4 F (36.3 C)    TempSrc:  Oral    Resp: 19  20 17   Height:      Weight:   61.1 kg (134 lb 11.2 oz)   SpO2: 100%  100% 100%    Last BM Date: 10/13/14  Intake/Output   Yesterday:  09/28 0701 - 09/29 0700 In: 5681 [P.O.:840; I.V.:240; NG/GT:60; IV Piggyback:700] Out: 1125 [Urine:825; Emesis/NG output:300] This shift: I/O last 3 completed shifts: In: 2727.5 [P.O.:840; I.V.:277.5; NG/GT:60; IV Piggyback:1550] Out: 2975 [Urine:2575; Emesis/NG output:400]   Physical Exam: General: Pt awake/alert/oriented x4 in no acute distress  Abdomen: +BS, abdomen is distended from ascites.  Midline wound is clean, fascia is intact.  Soft and reducible umbilical hernia without surrounding erythema.  Crepitance noted left abdominal wall.   Problem List:   Active Problems:   Perforation bowel    Results:   Labs: Results for orders placed or performed during the hospital encounter of 10/10/14 (from the past 48 hour(s))  CBC     Status: Abnormal   Collection Time: 10/12/14  8:04 AM  Result Value Ref Range   WBC 6.9 4.0 - 10.5 K/uL    Comment: REPEATED TO VERIFY   RBC 2.65 (L) 3.87 - 5.11 MIL/uL   Hemoglobin 6.1 (LL) 12.0 - 15.0 g/dL    Comment: REPEATED TO VERIFY CRITICAL RESULT CALLED TO, READ BACK BY AND VERIFIED WITH: Oneita Jolly RN [phone] 10/12/2014 BY MACEDA, J    HCT 20.7 (L) 36.0 - 46.0 %   MCV 78.1 78.0 - 100.0 fL   MCH 23.0 (L) 26.0 - 34.0 pg   MCHC 29.5 (L) 30.0 - 36.0 g/dL    RDW 18.4 (H) 11.5 - 15.5 %   Platelets 172 150 - 400 K/uL    Comment: REPEATED TO VERIFY  APTT     Status: Abnormal   Collection Time: 10/12/14  8:04 AM  Result Value Ref Range   aPTT 45 (H) 24 - 37 seconds    Comment:        IF BASELINE aPTT IS ELEVATED, SUGGEST PATIENT RISK ASSESSMENT BE USED TO DETERMINE APPROPRIATE ANTICOAGULANT THERAPY.   Protime-INR     Status: Abnormal   Collection Time: 10/12/14  8:04 AM  Result Value Ref Range   Prothrombin Time 22.9 (H) 11.6 - 15.2 seconds   INR 2.04 (H) 0.00 - 1.49  Prepare RBC     Status: None   Collection Time: 10/12/14  9:59 AM  Result Value Ref Range   Order Confirmation      ORDER PROCESSED BY BLOOD BANK BLOOD ALREADY AVAILABLE  Prepare fresh frozen plasma     Status: None   Collection Time: 10/12/14 10:00 AM  Result Value Ref Range   Unit Number [id number]    Blood Component Type THAWED PLASMA    Unit division 00    Status of Unit  ISSUED,FINAL    Transfusion Status OK TO TRANSFUSE    Unit Number [id number]    Blood Component Type THAWED PLASMA    Unit division 00    Status of Unit ISSUED,FINAL    Transfusion Status OK TO TRANSFUSE   Transfusion reaction     Status: None   Collection Time: 10/12/14  5:46 PM  Result Value Ref Range   Post RXN DAT IgG NEG    DAT C3 NEG    Path interp tx rxn      No laboratory evidence of hemolytic transfusion reaction.  Considerations include volume overload and transfusion related acute lung injury (TRALI) per Chrystie Nose. Saralyn Pilar, MD.  Urinalysis with microscopic     Status: Abnormal   Collection Time: 10/12/14  5:46 PM  Result Value Ref Range   Color, Urine YELLOW YELLOW   APPearance CLEAR CLEAR   Specific Gravity, Urine 1.010 1.005 - 1.030   pH 6.5 5.0 - 8.0   Glucose, UA NEGATIVE NEGATIVE mg/dL   Hgb urine dipstick SMALL (A) NEGATIVE   Bilirubin Urine NEGATIVE NEGATIVE   Ketones, ur NEGATIVE NEGATIVE mg/dL   Protein, ur NEGATIVE NEGATIVE mg/dL   Urobilinogen, UA 0.2 0.0 -  1.0 mg/dL   Nitrite NEGATIVE NEGATIVE   Leukocytes, UA NEGATIVE NEGATIVE   WBC, UA 3-6 <3 WBC/hpf   RBC / HPF 0-2 <3 RBC/hpf   Bacteria, UA RARE RARE   Squamous Epithelial / LPF RARE RARE   Casts HYALINE CASTS (A) NEGATIVE   Urine-Other LESS THAN 10 mL OF URINE SUBMITTED   CBC with Differential/Platelet     Status: Abnormal   Collection Time: 10/12/14  8:09 PM  Result Value Ref Range   WBC 3.2 (L) 4.0 - 10.5 K/uL   RBC 2.93 (L) 3.87 - 5.11 MIL/uL   Hemoglobin 7.0 (L) 12.0 - 15.0 g/dL   HCT 23.0 (L) 36.0 - 46.0 %   MCV 78.5 78.0 - 100.0 fL   MCH 23.9 (L) 26.0 - 34.0 pg   MCHC 30.4 30.0 - 36.0 g/dL   RDW 18.0 (H) 11.5 - 15.5 %   Platelets 113 (L) 150 - 400 K/uL    Comment: PLATELET COUNT CONFIRMED BY SMEAR REPEATED TO VERIFY SPECIMEN CHECKED FOR CLOTS    Neutrophils Relative % 88 %   Neutro Abs 2.8 1.7 - 7.7 K/uL   Lymphocytes Relative 5 %   Lymphs Abs 0.2 (L) 0.7 - 4.0 K/uL   Monocytes Relative 5 %   Monocytes Absolute 0.2 0.1 - 1.0 K/uL   Eosinophils Relative 2 %   Eosinophils Absolute 0.1 0.0 - 0.7 K/uL   Basophils Relative 0 %   Basophils Absolute 0.0 0.0 - 0.1 K/uL  Protime-INR     Status: Abnormal   Collection Time: 10/12/14  8:09 PM  Result Value Ref Range   Prothrombin Time 21.5 (H) 11.6 - 15.2 seconds   INR 1.87 (H) 0.00 - 1.49  APTT     Status: Abnormal   Collection Time: 10/12/14  8:09 PM  Result Value Ref Range   aPTT 41 (H) 24 - 37 seconds    Comment:        IF BASELINE aPTT IS ELEVATED, SUGGEST PATIENT RISK ASSESSMENT BE USED TO DETERMINE APPROPRIATE ANTICOAGULANT THERAPY.   Basic metabolic panel     Status: Abnormal   Collection Time: 10/12/14  8:09 PM  Result Value Ref Range   Sodium 137 135 - 145 mmol/L   Potassium 2.6 (LL) 3.5 -  5.1 mmol/L    Comment: CRITICAL RESULT CALLED TO, READ BACK BY AND VERIFIED WITH: L CAUDLE,RN 2056 10/12/14 D BRADLEY    Chloride 112 (H) 101 - 111 mmol/L   CO2 13 (L) 22 - 32 mmol/L   Glucose, Bld 95 65 - 99 mg/dL    BUN 21 (H) 6 - 20 mg/dL   Creatinine, Ser 1.33 (H) 0.44 - 1.00 mg/dL   Calcium 6.1 (LL) 8.9 - 10.3 mg/dL    Comment: CRITICAL RESULT CALLED TO, READ BACK BY AND VERIFIED WITH: L CAUDLE,RN 2056 10/12/14 D BRADLEY    GFR calc non Af Amer 42 (L) >60 mL/min   GFR calc Af Amer 49 (L) >60 mL/min    Comment: (NOTE) The eGFR has been calculated using the CKD EPI equation. This calculation has not been validated in all clinical situations. eGFR's persistently <60 mL/min signify possible Chronic Kidney Disease.    Anion gap 12 5 - 15  Glucose, capillary     Status: Abnormal   Collection Time: 10/13/14 12:15 AM  Result Value Ref Range   Glucose-Capillary 147 (H) 65 - 99 mg/dL   Comment 1 Notify RN   Comprehensive metabolic panel     Status: Abnormal   Collection Time: 10/13/14  5:00 AM  Result Value Ref Range   Sodium 137 135 - 145 mmol/L   Potassium 3.4 (L) 3.5 - 5.1 mmol/L    Comment: DELTA CHECK NOTED   Chloride 114 (H) 101 - 111 mmol/L   CO2 13 (L) 22 - 32 mmol/L   Glucose, Bld 148 (H) 65 - 99 mg/dL   BUN 24 (H) 6 - 20 mg/dL   Creatinine, Ser 1.36 (H) 0.44 - 1.00 mg/dL   Calcium 6.5 (L) 8.9 - 10.3 mg/dL   Total Protein 4.3 (L) 6.5 - 8.1 g/dL   Albumin 1.9 (L) 3.5 - 5.0 g/dL   AST 27 15 - 41 U/L   ALT 13 (L) 14 - 54 U/L   Alkaline Phosphatase 53 38 - 126 U/L   Total Bilirubin 1.4 (H) 0.3 - 1.2 mg/dL   GFR calc non Af Amer 41 (L) >60 mL/min   GFR calc Af Amer 48 (L) >60 mL/min    Comment: (NOTE) The eGFR has been calculated using the CKD EPI equation. This calculation has not been validated in all clinical situations. eGFR's persistently <60 mL/min signify possible Chronic Kidney Disease.    Anion gap 10 5 - 15  Phosphorus     Status: None   Collection Time: 10/13/14  5:00 AM  Result Value Ref Range   Phosphorus 3.8 2.5 - 4.6 mg/dL  Magnesium     Status: None   Collection Time: 10/13/14  5:00 AM  Result Value Ref Range   Magnesium 1.8 1.7 - 2.4 mg/dL  CBC with  Differential/Platelet     Status: Abnormal   Collection Time: 10/13/14  5:00 AM  Result Value Ref Range   WBC 2.7 (L) 4.0 - 10.5 K/uL   RBC 2.90 (L) 3.87 - 5.11 MIL/uL   Hemoglobin 6.9 (LL) 12.0 - 15.0 g/dL    Comment: CRITICAL RESULT CALLED TO, READ BACK BY AND VERIFIED WITH: CAUDLE,L RN 10/13/2014 0545 JORDANS REPEATED TO VERIFY    HCT 22.9 (L) 36.0 - 46.0 %   MCV 79.0 78.0 - 100.0 fL   MCH 23.8 (L) 26.0 - 34.0 pg   MCHC 30.1 30.0 - 36.0 g/dL   RDW 17.9 (H) 11.5 - 15.5 %  Platelets 91 (L) 150 - 400 K/uL    Comment: CONSISTENT WITH PREVIOUS RESULT   Neutrophils Relative % 84 %   Neutro Abs 2.3 1.7 - 7.7 K/uL   Lymphocytes Relative 12 %   Lymphs Abs 0.3 (L) 0.7 - 4.0 K/uL   Monocytes Relative 4 %   Monocytes Absolute 0.1 0.1 - 1.0 K/uL   Eosinophils Relative 0 %   Eosinophils Absolute 0.0 0.0 - 0.7 K/uL   Basophils Relative 0 %   Basophils Absolute 0.0 0.0 - 0.1 K/uL  Comprehensive metabolic panel     Status: Abnormal   Collection Time: 10/14/14  5:05 AM  Result Value Ref Range   Sodium 137 135 - 145 mmol/L   Potassium 3.2 (L) 3.5 - 5.1 mmol/L   Chloride 112 (H) 101 - 111 mmol/L   CO2 16 (L) 22 - 32 mmol/L   Glucose, Bld 195 (H) 65 - 99 mg/dL   BUN 32 (H) 6 - 20 mg/dL   Creatinine, Ser 1.19 (H) 0.44 - 1.00 mg/dL   Calcium 6.7 (L) 8.9 - 10.3 mg/dL   Total Protein 4.6 (L) 6.5 - 8.1 g/dL   Albumin 1.8 (L) 3.5 - 5.0 g/dL   AST 25 15 - 41 U/L   ALT 13 (L) 14 - 54 U/L   Alkaline Phosphatase 59 38 - 126 U/L   Total Bilirubin 0.8 0.3 - 1.2 mg/dL   GFR calc non Af Amer 49 (L) >60 mL/min   GFR calc Af Amer 56 (L) >60 mL/min    Comment: (NOTE) The eGFR has been calculated using the CKD EPI equation. This calculation has not been validated in all clinical situations. eGFR's persistently <60 mL/min signify possible Chronic Kidney Disease.    Anion gap 9 5 - 15  Phosphorus     Status: None   Collection Time: 10/14/14  5:05 AM  Result Value Ref Range   Phosphorus 2.6 2.5 -  4.6 mg/dL  Magnesium     Status: None   Collection Time: 10/14/14  5:05 AM  Result Value Ref Range   Magnesium 2.0 1.7 - 2.4 mg/dL  CBC with Differential/Platelet     Status: Abnormal   Collection Time: 10/14/14  5:05 AM  Result Value Ref Range   WBC 4.6 4.0 - 10.5 K/uL   RBC 3.23 (L) 3.87 - 5.11 MIL/uL   Hemoglobin 7.6 (L) 12.0 - 15.0 g/dL   HCT 25.3 (L) 36.0 - 46.0 %   MCV 78.3 78.0 - 100.0 fL   MCH 23.5 (L) 26.0 - 34.0 pg   MCHC 30.0 30.0 - 36.0 g/dL   RDW 18.3 (H) 11.5 - 15.5 %   Platelets 97 (L) 150 - 400 K/uL    Comment: CONSISTENT WITH PREVIOUS RESULT   Neutrophils Relative % 86 %   Neutro Abs 3.9 1.7 - 7.7 K/uL   Lymphocytes Relative 8 %   Lymphs Abs 0.4 (L) 0.7 - 4.0 K/uL   Monocytes Relative 6 %   Monocytes Absolute 0.3 0.1 - 1.0 K/uL   Eosinophils Relative 0 %   Eosinophils Absolute 0.0 0.0 - 0.7 K/uL   Basophils Relative 0 %   Basophils Absolute 0.0 0.0 - 0.1 K/uL    Imaging / Studies: Dg Chest Port 1 View  10/12/2014   CLINICAL DATA:  Shortness of breath, onset 15 minutes prior.  EXAM: PORTABLE CHEST 1 VIEW  COMPARISON:  10/10/2014  FINDINGS: Tip of the right central line in the region of the right  atrium. Enteric tube in place, tip below the diaphragm not included in the field of view. Low lung volumes persist. There is bibasilar atelectasis. No consolidation to suggest pneumonia. Cardiomediastinal contours are normal. No large pleural effusion or pneumothorax. Deformity of the distal left clavicle again seen.  IMPRESSION: 1. Tip of the right central line in the region of the right atrium. 2. Low lung volumes with bibasilar atelectasis. The degree of atelectasis has increased from prior.   Electronically Signed   By: Jeb Levering M.D.   On: 10/12/2014 18:17    Medications / Allergies:  Scheduled Meds: . sodium chloride   Intravenous Once  . sodium chloride   Intravenous Once  . sodium chloride   Intravenous Once  . antiseptic oral rinse  7 mL Mouth Rinse  [id number]  . aztreonam  1 g Intravenous Q8H  . chlorhexidine  15 mL Mouth Rinse BID  . Chlorhexidine Gluconate Cloth  6 each Topical Q0600  . hydrocortisone   Rectal TID  . metronidazole  500 mg Intravenous Q8H  . mupirocin ointment  1 application Nasal BID  . pantoprazole (PROTONIX) IV  40 mg Intravenous Q12H  . potassium chloride  10 mEq Intravenous Q1 Hr x 4  . vancomycin  1,000 mg Intravenous Q24H   Continuous Infusions: . norepinephrine (LEVOPHED) Adult infusion Stopped (10/13/14 0611)   PRN Meds:.sodium chloride, albuterol, diphenhydrAMINE, fentaNYL (SUBLIMAZE) injection  Antibiotics: Anti-infectives    Start     Dose/Rate Route Frequency Ordered Stop   10/13/14 1800  aztreonam (AZACTAM) 1 g in dextrose 5 % 50 mL IVPB     1 g 100 mL/hr over 30 Minutes Intravenous Every 8 hours 10/13/14 0935     10/11/14 2000  vancomycin (VANCOCIN) IVPB 1000 mg/200 mL premix     1,000 mg 200 mL/hr over 60 Minutes Intravenous Every 24 hours 10/10/14 1900     10/10/14 2000  metroNIDAZOLE (FLAGYL) IVPB 500 mg     500 mg 100 mL/hr over 60 Minutes Intravenous Every 8 hours 10/10/14 1900     10/10/14 2000  vancomycin (VANCOCIN) 1,500 mg in sodium chloride 0.9 % 500 mL IVPB     1,500 mg 250 mL/hr over 120 Minutes Intravenous  Once 10/10/14 1900 10/11/14 0040   10/10/14 2000  aztreonam (AZACTAM) 2 g in dextrose 5 % 50 mL IVPB  Status:  Discontinued     2 g 100 mL/hr over 30 Minutes Intravenous Every 12 hours 10/10/14 1900 10/13/14 0935   10/10/14 1900  aztreonam (AZACTAM) 2 g in dextrose 5 % 50 mL IVPB  Status:  Discontinued     2 g 100 mL/hr over 30 Minutes Intravenous 4 times per day 10/10/14 1846 10/10/14 1900   10/10/14 1845  vancomycin (VANCOCIN) IVPB 1000 mg/200 mL premix  Status:  Discontinued     1,000 mg 200 mL/hr over 60 Minutes Intravenous Every 12 hours 10/10/14 1844 10/10/14 1900        Assessment/Plan POD#4 exploratory laparotomy, graham patch repair of pre pyloric gastric ulcer,  primary umbilical repair---Dr. Barry Dienes -DC NGT and allow for fulls -PT/OT eval -PPI -add PO pain meds -BID wet to dry dressing changes ID-Aztreonam/flagyl D#4/7 for sepsis/peritonitis, ?stop Vanc FEN-advance diet as tolerated External hemorrhoids-non thrombosed, agree with hemorrhoids.  Avoid constipation/straining. Umbilical hernia-soft and reducible.  May get worse given ascites VTE prophylaxis-SCDs, coagulopathic INR 1.8   Erby Pian, [facility] Surgery Pager [phone](7A-4:30P) For consults and floor pages call [phone](7A-4:30P)  10/14/2014 7:54 AM

## 2014-10-14 NOTE — Evaluation (Signed)
Physical Therapy Evaluation Patient Details Name: [name] MRN: [id number] DOB: Aug 14, 1955 Today's Date: 10/14/2014   History of Present Illness  Pt is a 59 y/o female with a PMH of ETOH cirrhosis with worsening ascites requiring more frequent paracentesis with most recent on 9/15 with 10.5L removed. She presented to [facility] on 9/25 with a 2 day history of worsening abdominal pain and increased abdominal distention. She was transferred to [facility] for exploratory laparotomy due to bowel perforartion and ICU management.   Clinical Impression  Pt admitted with above diagnosis. Pt currently with functional limitations due to the deficits listed below (see PT Problem List). At the time of PT eval pt was able to perform transfer bed>chair with +2 assist mainly for lines/safety. Feel pt will be able to manage mobility better with RW for support. Pt reports she may d/c over the weekend and would like to go home. Will need to further assess ambulation and tolerance for functional activity, as well as confirm amount of assist available at d/c. Pt will benefit from skilled PT to increase their independence and safety with mobility to allow discharge to the venue listed below.       Follow Up Recommendations Home health PT;Supervision for mobility/OOB    Equipment Recommendations  3in1 (PT)    Recommendations for Other Services       Precautions / Restrictions Precautions Precautions: Fall Restrictions Weight Bearing Restrictions: No      Mobility  Bed Mobility Overal bed mobility: Needs Assistance Bed Mobility: Supine to Sit     Supine to sit: Supervision     General bed mobility comments: Pt sitting up in bed with HOB elevated. From this position pt scooted LE's around to EOB and transitioned well. Will need to practice from flat surface next session.   Transfers Overall transfer level: Needs assistance Equipment used: 2 person hand held assist Transfers: Sit to/from [employer] Sit to Stand: Min assist;+2 physical assistance Stand pivot transfers: Min assist;+2 safety/equipment       General transfer comment: Pt was cued for hand placement on seated surface for safety. She was not able to push up from bed (due to ?pain?) but was able to stand with min assist on either side. Assist for line management and balance as she took pivotal steps around from bed>chair.  Ambulation/Gait                Stairs            Wheelchair Mobility    Modified Rankin (Stroke Patients Only)       Balance Overall balance assessment: Needs assistance Sitting-balance support: Feet supported;No upper extremity supported Sitting balance-Leahy Scale: Fair     Standing balance support: Bilateral upper extremity supported;During functional activity Standing balance-Leahy Scale: Poor Standing balance comment: Requires UE support                             Pertinent Vitals/Pain Pain Assessment: Faces Faces Pain Scale: Hurts a little bit Pain Location: Abdomen Pain Descriptors / Indicators: Grimacing;Guarding Pain Intervention(s): Limited activity within patient's tolerance;Monitored during session;Repositioned    Home Living Family/patient expects to be discharged to:: Private residence Living Arrangements: Alone Available Help at Discharge: Family;Available PRN/intermittently Type of Home: House Home Access: Level entry     Home Layout: One level Home Equipment: Walker - 2 wheels      Prior Function Level of Independence: Independent  Comments: Pt states she has not used her RW recently and just has it available for PRN.      Hand Dominance   Dominant Hand: Right    Extremity/Trunk Assessment   Upper Extremity Assessment: Overall WFL for tasks assessed           Lower Extremity Assessment: Generalized weakness      Cervical / Trunk Assessment: Normal  Communication   Communication: No difficulties   Cognition Arousal/Alertness: Awake/alert Behavior During Therapy: WFL for tasks assessed/performed Overall Cognitive Status: Within Functional Limits for tasks assessed                      General Comments      Exercises        Assessment/Plan    PT Assessment Patient needs continued PT services  PT Diagnosis Difficulty walking;Generalized weakness   PT Problem List Decreased strength;Decreased range of motion;Decreased activity tolerance;Decreased balance;Decreased mobility;Decreased knowledge of use of DME;Decreased safety awareness;Decreased knowledge of precautions;Pain  PT Treatment Interventions DME instruction;Gait training;Stair training;Functional mobility training;Therapeutic activities;Therapeutic exercise;Neuromuscular re-education;Patient/family education   PT Goals (Current goals can be found in the Care Plan section) Acute Rehab PT Goals Patient Stated Goal: Be able to return home PT Goal Formulation: With patient Time For Goal Achievement: 10/21/14 Potential to Achieve Goals: Good    Frequency Min 3X/week   Barriers to discharge Decreased caregiver support Unsure how often son will be available to supervise at home    Co-evaluation               End of Session   Activity Tolerance: Patient limited by fatigue Patient left: in chair;with call bell/phone within reach Nurse Communication: Mobility status         Time: 4656-8127 PT Time Calculation (min) (ACUTE ONLY): 18 min   Charges:   PT Evaluation $Initial PT Evaluation Tier I: 1 Procedure     PT G CodesRolinda Hernandez 2014/10/16, 12:13 PM   [name], PT, DPT Acute Rehabilitation Services Pager: [phone]

## 2014-10-14 NOTE — Consult Note (Signed)
ANTIBIOTIC CONSULT NOTE - FOLLOW UP  Pharmacy Consult for vancomycin, Aztreonam  Indication: peritonitis  Allergies  Allergen Reactions  . Penicillins Hives    Has patient had a PCN reaction causing immediate rash, facial/tongue/throat swelling, SOB or lightheadedness with hypotension: Yes Has patient had a PCN reaction causing severe rash involving mucus membranes or skin necrosis: No Has patient had a PCN reaction that required hospitalization No Has patient had a PCN reaction occurring within the last 10 years: No If all of the above answers are "NO", then may proceed with Cephalosporin use.    Patient Measurements: Height: 5\' 5"  (165.1 cm) Weight: 134 lb 11.2 oz (61.1 kg) IBW/kg (Calculated) : 57  Vital Signs: Temp: 97.7 F (36.5 C) (09/29 2040) Temp Source: Oral (09/29 2040) BP: 102/60 mmHg (09/29 2040) Pulse Rate: 127 (09/29 2040) Intake/Output from previous Shemeca Lukasik: 09/28 0701 - 09/29 0700 In: 1840 [P.O.:840; I.V.:240; NG/GT:60; IV Piggyback:700] Out: 1125 [Urine:825; Emesis/NG output:300] Intake/Output from this shift:    Labs:  Recent Labs  10/12/14 2009 10/13/14 0500 10/14/14 0505  WBC 3.2* 2.7* 4.6  HGB 7.0* 6.9* 7.6*  PLT 113* 91* 97*  CREATININE 1.33* 1.36* 1.19*   Estimated Creatinine Clearance: 45.2 mL/min (by C-G formula based on Cr of 1.19).  Recent Labs  10/14/14 1933  [place] 27*     Microbiology: Recent Results (from the past 720 hour(s))  Body fluid culture     Status: None   Collection Time: 10/10/14  8:09 AM  Result Value Ref Range Status   Specimen Description ASCITIES  Final   Special Requests NONE  Final   Gram Stain FEW WBC SEEN NO ORGANISMS SEEN   Final   Culture NO GROWTH 4 DAYS  Final   Report Status 10/14/2014 FINAL  Final  Culture, blood (routine x 2)     Status: None (Preliminary result)   Collection Time: 10/10/14  8:40 AM  Result Value Ref Range Status   Specimen Description BLOOD RIGHT HAND  Final   Special  Requests BOTTLES DRAWN AEROBIC AND ANAEROBIC  1CC  Final   Culture NO GROWTH 4 DAYS  Final   Report Status PENDING  Incomplete  Culture, blood (routine x 2)     Status: None (Preliminary result)   Collection Time: 10/10/14  8:46 AM  Result Value Ref Range Status   Specimen Description BLOOD LEFT AC  Final   Special Requests BOTTLES DRAWN AEROBIC AND ANAEROBIC  2CC  Final   Culture NO GROWTH 4 DAYS  Final   Report Status PENDING  Incomplete  Urine culture     Status: None   Collection Time: 10/10/14  6:52 PM  Result Value Ref Range Status   Specimen Description URINE, CATHETERIZED  Final   Special Requests NONE  Final   Culture NO GROWTH 1 Haziel Molner  Final   Report Status 10/11/2014 FINAL  Final  Culture, blood (routine x 2)     Status: None (Preliminary result)   Collection Time: 10/10/14  7:23 PM  Result Value Ref Range Status   Specimen Description BLOOD RIGHT HAND  Final   Special Requests BOTTLES DRAWN AEROBIC AND ANAEROBIC 5CC  Final   Culture NO GROWTH 4 DAYS  Final   Report Status PENDING  Incomplete  Culture, blood (routine x 2)     Status: None (Preliminary result)   Collection Time: 10/10/14  7:30 PM  Result Value Ref Range Status   Specimen Description BLOOD LEFT HAND  Final   Special Requests  IN PEDIATRIC BOTTLE 2CC  Final   Culture NO GROWTH 4 DAYS  Final   Report Status PENDING  Incomplete  MRSA PCR Screening     Status: Abnormal   Collection Time: 10/10/14  7:42 PM  Result Value Ref Range Status   MRSA by PCR POSITIVE (A) NEGATIVE Final    Comment:        The GeneXpert MRSA Assay (FDA approved for NASAL specimens only), is one component of a comprehensive MRSA colonization surveillance program. It is not intended to diagnose MRSA infection nor to guide or monitor treatment for MRSA infections. RESULT CALLED TO, READ BACK BY AND VERIFIED WITH: FLYNT,F RN 2158 10/10/14 MITCHELL,L     Medical History: Past Medical History  Diagnosis Date  . Cirrhosis of liver    . Hemorrhoid    Assessment: 59 yo female who presented with ascites and abdominal pain currently on D#4 of empiric IV antibiotics for peritonitis. CT showed a perforated gastric ulcer. She is s/p ex lap with omental graham patching of pre-pyloric gastric ulcer. WBC is down to 2.7 today. Pt is afebrile. CrCl improved to ~ 40 mL/min  VT 27 on 1 gram q24 - dose not given yet tonight - will decrease dose.  Aztreonam 9/25>> Vancomycin 9/25>> Metronidazole 9/25>>  9/25 Urine>>NegF 9/25 MRSA PCR 9/25 Blood x 2>>NGTD   Goal of Therapy:  Vancomycin trough level 15-20 mcg/ml  Plan:  Decrease Vancomycin 750 mg q24h Increase Aztreonam to 1 g q8h Metronidazole 500 mg q8h Monitor clinical status, renal fx, cultures, and VT prn  [name] Pharm.D. CPP, BCPS Clinical Pharmacist [phone] 10/14/2014 8:54 PM

## 2014-10-15 LAB — TYPE AND SCREEN
ABO/RH(D): O POS
ANTIBODY SCREEN: NEGATIVE
Unit division: 0

## 2014-10-15 LAB — CBC WITH DIFFERENTIAL/PLATELET
BASOS ABS: 0 10*3/uL (ref 0.0–0.1)
Basophils Relative: 0 %
EOS ABS: 0 10*3/uL (ref 0.0–0.7)
EOS PCT: 0 %
HCT: 26.2 % — ABNORMAL LOW (ref 36.0–46.0)
Hemoglobin: 7.9 g/dL — ABNORMAL LOW (ref 12.0–15.0)
LYMPHS PCT: 16 %
Lymphs Abs: 0.7 10*3/uL (ref 0.7–4.0)
MCH: 23.9 pg — ABNORMAL LOW (ref 26.0–34.0)
MCHC: 30.2 g/dL (ref 30.0–36.0)
MCV: 79.4 fL (ref 78.0–100.0)
Monocytes Absolute: 0.6 10*3/uL (ref 0.1–1.0)
Monocytes Relative: 12 %
NEUTROS PCT: 72 %
Neutro Abs: 3.4 10*3/uL (ref 1.7–7.7)
PLATELETS: 123 10*3/uL — AB (ref 150–400)
RBC: 3.3 MIL/uL — AB (ref 3.87–5.11)
RDW: 18.7 % — ABNORMAL HIGH (ref 11.5–15.5)
WBC: 4.7 10*3/uL (ref 4.0–10.5)

## 2014-10-15 LAB — RENAL FUNCTION PANEL
Albumin: 1.8 g/dL — ABNORMAL LOW (ref 3.5–5.0)
Anion gap: 7 (ref 5–15)
BUN: 34 mg/dL — ABNORMAL HIGH (ref 6–20)
CALCIUM: 7.2 mg/dL — AB (ref 8.9–10.3)
CO2: 16 mmol/L — ABNORMAL LOW (ref 22–32)
CREATININE: 1.16 mg/dL — AB (ref 0.44–1.00)
Chloride: 118 mmol/L — ABNORMAL HIGH (ref 101–111)
GFR, EST AFRICAN AMERICAN: 58 mL/min — AB (ref >60)
GFR, EST NON AFRICAN AMERICAN: 50 mL/min — AB (ref >60)
Glucose, Bld: 135 mg/dL — ABNORMAL HIGH (ref 65–99)
Phosphorus: 3.2 mg/dL (ref 2.5–4.6)
Potassium: 3.5 mmol/L (ref 3.5–5.1)
SODIUM: 141 mmol/L (ref 135–145)

## 2014-10-15 LAB — CULTURE, BLOOD (ROUTINE X 2)
CULTURE: NO GROWTH
Culture: NO GROWTH

## 2014-10-15 LAB — MAGNESIUM: MAGNESIUM: 1.9 mg/dL (ref 1.7–2.4)

## 2014-10-15 LAB — PREPARE RBC (CROSSMATCH)

## 2014-10-15 MED ORDER — POTASSIUM CHLORIDE CRYS ER 20 MEQ PO TBCR
40.0000 meq | EXTENDED_RELEASE_TABLET | Freq: Once | ORAL | Status: AC
  Administered 2014-10-15: 40 meq via ORAL
  Filled 2014-10-15: qty 2

## 2014-10-15 MED ORDER — DOCUSATE SODIUM 100 MG PO CAPS
100.0000 mg | ORAL_CAPSULE | Freq: Two times a day (BID) | ORAL | Status: DC
  Administered 2014-10-15 – 2014-10-16 (×3): 100 mg via ORAL
  Filled 2014-10-15 (×3): qty 1

## 2014-10-15 MED ORDER — SODIUM CHLORIDE 0.9 % IV SOLN
Freq: Once | INTRAVENOUS | Status: AC
  Administered 2014-10-15: 13:00:00 via INTRAVENOUS

## 2014-10-15 MED ORDER — POLYETHYLENE GLYCOL 3350 17 G PO PACK: 17.0000 g | PACK | Freq: Every day | ORAL | Status: DC | PRN

## 2014-10-15 NOTE — Progress Notes (Signed)
Patient Demographics  [name], is a 59 y.o. female, DOB - 1955-09-16, JOA:[id number]  Admit date - 10/10/2014   Admitting Physician Brand Males, MD  Outpatient Primary MD for the patient is Tomasa Hose A, MD  LOS - 5   No chief complaint on file.        Subjective:   [name] today has, No headache, No chest pain,  - No Nausea, , No Cough - SOB, abdominal pain significantly subsided, had flatness on one bowel movement yesterday.  Assessment & Plan    Active Problems:   Perforation bowel  Perforated gastric ulcer - Status  post surgical repair 9/25, exploratory laparotomy, graham patch repair of pre pyloric gastric ulcer, primary umbilical repair---Dr. Barry Dienes - Continue with PPI - Abdominal pain significantly subsided, management as per surgical team, advanced to soft diet today. - Continue with IV antibiotics including aztreonam and Flagyl, Day 5/7, discontinue IV vancomycin.  Acute renal failure - Improving, continue with IV fluids  Acute hypoxic respiratory failure - Resolved  Alcohol liver disease with ascites - Requiring paracentesis in the past, will resume back on Lasix when she is more stable. - LFTs within normal limits  Anemia - Status post PRBC transfusion, on 9/27, will transfuse another unit today. - Will need iron supplements on discharge.  Alcohol abuse - No signs of withdrawal  Hypokalemia/hypomagnesemia - Repleting, monitor closely  Protein calorie malnutrition - We'll start on ensure  Code Status: Full code  Family Communication: None at bedside  Disposition Plan: Ending PT evaluation   Procedures  9/25, exploratory laparotomy, graham patch repair of pre pyloric gastric ulcer, primary umbilical repair---Dr. Barry Dienes 9/27, extubated   Consults   PCCM >>TRH Gen. surgery   Medications  Scheduled Meds: . sodium chloride   Intravenous  Once  . sodium chloride   Intravenous Once  . sodium chloride   Intravenous Once  . sodium chloride   Intravenous Once  . aztreonam  1 g Intravenous Q8H  . docusate sodium  100 mg Oral BID  . hydrocortisone   Rectal TID  . metronidazole  500 mg Intravenous Q8H  . pantoprazole (PROTONIX) IV  40 mg Intravenous Q12H  . vancomycin  750 mg Intravenous Q24H   Continuous Infusions:  PRN Meds:.sodium chloride, albuterol, diphenhydrAMINE, oxyCODONE, polyethylene glycol  DVT Prophylaxis   SCDs, no chemical anticoagulation given perforated gastric ulcer  Lab Results  Component Value Date   PLT 123* 10/15/2014    Antibiotics   Anti-infectives    Start     Dose/Rate Route Frequency Ordered Stop   10/15/14 0000  vancomycin (VANCOCIN) IVPB 750 mg/150 ml premix     750 mg 150 mL/hr over 60 Minutes Intravenous Every 24 hours 10/14/14 2052     10/13/14 1800  aztreonam (AZACTAM) 1 g in dextrose 5 % 50 mL IVPB     1 g 100 mL/hr over 30 Minutes Intravenous Every 8 hours 10/13/14 0935     10/11/14 2000  vancomycin (VANCOCIN) IVPB 1000 mg/200 mL premix  Status:  Discontinued     1,000 mg 200 mL/hr over 60 Minutes Intravenous Every 24 hours 10/10/14 1900 10/14/14 2052   10/10/14 2000  metroNIDAZOLE (FLAGYL) IVPB 500 mg     500  mg 100 mL/hr over 60 Minutes Intravenous Every 8 hours 10/10/14 1900     10/10/14 2000  vancomycin (VANCOCIN) 1,500 mg in sodium chloride 0.9 % 500 mL IVPB     1,500 mg 250 mL/hr over 120 Minutes Intravenous  Once 10/10/14 1900 10/11/14 0040   10/10/14 2000  aztreonam (AZACTAM) 2 g in dextrose 5 % 50 mL IVPB  Status:  Discontinued     2 g 100 mL/hr over 30 Minutes Intravenous Every 12 hours 10/10/14 1900 10/13/14 0935   10/10/14 1900  aztreonam (AZACTAM) 2 g in dextrose 5 % 50 mL IVPB  Status:  Discontinued     2 g 100 mL/hr over 30 Minutes Intravenous 4 times per day 10/10/14 1846 10/10/14 1900   10/10/14 1845  vancomycin (VANCOCIN) IVPB 1000 mg/200 mL premix  Status:   Discontinued     1,000 mg 200 mL/hr over 60 Minutes Intravenous Every 12 hours 10/10/14 1844 10/10/14 1900          Objective:   Filed Vitals:   10/14/14 2040 10/15/14 0432 10/15/14 0501 10/15/14 0800  BP: 102/60  98/58 110/61  Pulse: 127  80 83  Temp: 97.7 F (36.5 C)  98.1 F (36.7 C) 98.1 F (36.7 C)  TempSrc: Oral  Oral Oral  Resp: 21  16   Height:      Weight:  62.596 kg (138 lb)    SpO2: 100%  100%     Wt Readings from Last 3 Encounters:  10/15/14 62.596 kg (138 lb)  10/10/14 61.236 kg (135 lb)  09/16/14 67.586 kg (149 lb)     Intake/Output Summary (Last 24 hours) at 10/15/14 1037 Last data filed at 10/15/14 1023  Gross per 24 hour  Intake   1460 ml  Output    250 ml  Net   1210 ml     Physical Exam  Awake Alert, Oriented X 3,  [place].AT,PERRAL Supple Neck,No JVD, No cervical lymphadenopathy appriciated.  Symmetrical Chest wall movement, Good air movement bilaterally,  RRR,No Gallops,Rubs or new Murmurs, No Parasternal Heave +ve B.Sounds, ascites+, soft, bandage  covering incision site No Cyanosis, Clubbing or edema, No new Rash or bruise    Data Review   Micro Results Recent Results (from the past 240 hour(s))  Body fluid culture     Status: None   Collection Time: 10/10/14  8:09 AM  Result Value Ref Range Status   Specimen Description ASCITIES  Final   Special Requests NONE  Final   Gram Stain FEW WBC SEEN NO ORGANISMS SEEN   Final   Culture NO GROWTH 4 DAYS  Final   Report Status 10/14/2014 FINAL  Final  Culture, blood (routine x 2)     Status: None (Preliminary result)   Collection Time: 10/10/14  8:40 AM  Result Value Ref Range Status   Specimen Description BLOOD RIGHT HAND  Final   Special Requests BOTTLES DRAWN AEROBIC AND ANAEROBIC  1CC  Final   Culture NO GROWTH 4 DAYS  Final   Report Status PENDING  Incomplete  Culture, blood (routine x 2)     Status: None (Preliminary result)   Collection Time: 10/10/14  8:46 AM  Result Value  Ref Range Status   Specimen Description BLOOD LEFT AC  Final   Special Requests BOTTLES DRAWN AEROBIC AND ANAEROBIC  2CC  Final   Culture NO GROWTH 4 DAYS  Final   Report Status PENDING  Incomplete  Urine culture     Status:  None   Collection Time: 10/10/14  6:52 PM  Result Value Ref Range Status   Specimen Description URINE, CATHETERIZED  Final   Special Requests NONE  Final   Culture NO GROWTH 1 DAY  Final   Report Status 10/11/2014 FINAL  Final  Culture, blood (routine x 2)     Status: None (Preliminary result)   Collection Time: 10/10/14  7:23 PM  Result Value Ref Range Status   Specimen Description BLOOD RIGHT HAND  Final   Special Requests BOTTLES DRAWN AEROBIC AND ANAEROBIC 5CC  Final   Culture NO GROWTH 4 DAYS  Final   Report Status PENDING  Incomplete  Culture, blood (routine x 2)     Status: None (Preliminary result)   Collection Time: 10/10/14  7:30 PM  Result Value Ref Range Status   Specimen Description BLOOD LEFT HAND  Final   Special Requests IN PEDIATRIC BOTTLE 2CC  Final   Culture NO GROWTH 4 DAYS  Final   Report Status PENDING  Incomplete  MRSA PCR Screening     Status: Abnormal   Collection Time: 10/10/14  7:42 PM  Result Value Ref Range Status   MRSA by PCR POSITIVE (A) NEGATIVE Final    Comment:        The GeneXpert MRSA Assay (FDA approved for NASAL specimens only), is one component of a comprehensive MRSA colonization surveillance program. It is not intended to diagnose MRSA infection nor to guide or monitor treatment for MRSA infections. RESULT CALLED TO, READ BACK BY AND VERIFIED WITH: FLYNT,F RN 2158 10/10/14 MITCHELL,L     Radiology Reports Ct Abdomen Pelvis Wo Contrast  10/10/2014   CLINICAL DATA:  Decrease abdominal pain, liver disease, ascites  EXAM: CT ABDOMEN AND PELVIS WITHOUT CONTRAST  TECHNIQUE: Multidetector CT imaging of the abdomen and pelvis was performed following the standard protocol without IV contrast.  COMPARISON:  Right upper  quadrant ultrasound dated 05/21/2014  FINDINGS: Lower chest: Trace bilateral pleural effusions. Mild dependent atelectasis the lung bases.  Hepatobiliary: Cirrhotic configuration of the liver.  Gallbladder is unremarkable. No intrahepatic or extrahepatic ductal dilatation.  Pancreas: Within normal limits.  Spleen: Splenomegaly.  Adrenals/Urinary Tract: Adrenal glands are within normal limits.  Right kidney is notable for three nonobstructing right renal calculi measuring up to 4 mm. 1.4 cm lateral right upper pole renal cyst (series 2/ image 44).  4 mm posterior interpolar left renal cyst (series 2/ image 41).  No hydronephrosis.  Bladder is underdistended but unremarkable.  Stomach/Bowel: Contrast within the stomach. Associated extraluminal contrast in the right mid abdomen (series 2/images 26 and 29), associated with the distal gastric antrum and proximal duodenum.  Focal contrast along the posterior aspect of the gastric antrum suggest a perforated gastric ulcer (series 2/image 33).  Scattered foci of free air with additional moderate pneumoperitoneum in the anterior abdomen (series 2/ images 41 and 62).  No evidence of bowel obstruction.  Appendix is not discretely visualized.  Mild wall thickening involving the right colon (series 2/ image 65), possibly secondary to ascites, infectious/ inflammatory colitis considered less likely but not excluded.  Colonic diverticulosis, without evidence of diverticulitis.  Vascular/Lymphatic: Atherosclerotic calcifications of the abdominal aorta and branch vessels.  No suspicious abdominopelvic lymphadenopathy.  Reproductive: Uterus is notable for multiple uterine fibroids, including a 4.6 cm calcified subserosal fundal fibroid (series 2/image 78).  No adnexal masses.  Other: Large volume abdominopelvic ascites.  Musculoskeletal: Degenerative changes of the visualized thoracolumbar spine, most prominent at L4-5.  IMPRESSION:  Extraluminal contrast along the distal  stomach/proximal duodenum, possibly secondary to a perforated gastric ulcer along the posterior gastric antrum.  Associated moderate pneumoperitoneum.  Large volume abdominopelvic ascites.  Additional ancillary findings as above.  Critical Value/emergent results were called by telephone at the time of interpretation on 10/10/2014 at 11:00 am to Dr. Carrie Mew, who verbally acknowledged these results.   Electronically Signed   By: Julian Hy M.D.   On: 10/10/2014 11:01   US Paracentesis  09/30/2014   INDICATION: Recurrent symptomatic ascites  EXAM: ULTRASOUND-GUIDED PARACENTESIS  COMPARISON:  Multiple prior ultrasound-guided paracenteses  MEDICATIONS: None.  COMPLICATIONS: None immediate  TECHNIQUE: Informed written consent was obtained from the patient after a discussion of the risks, benefits and alternatives to treatment. A timeout was performed prior to the initiation of the procedure.  Initial ultrasound scanning demonstrates a moderate to large amount of ascites within the right lower abdominal quadrant. The right lower abdomen was prepped and draped in the usual sterile fashion. 1% lidocaine with epinephrine was used for local anesthesia. An ultrasound image was saved for documentation purposed. An 8 Fr Safe-T-Centesis catheter was introduced. The paracentesis was performed. The catheter was removed and a dressing was applied. The patient tolerated the procedure well without immediate post procedural complication.  FINDINGS: A total of approximately 10.8 liters of serous fluid was removed.  IMPRESSION: Successful ultrasound-guided paracentesis yielding 10.8 liters of peritoneal fluid.   Electronically Signed   By: Sandi Mariscal M.D.   On: 09/30/2014 13:23   Dg Chest Port 1 View  10/12/2014   CLINICAL DATA:  Shortness of breath, onset 15 minutes prior.  EXAM: PORTABLE CHEST 1 VIEW  COMPARISON:  10/10/2014  FINDINGS: Tip of the right central line in the region of the right atrium. Enteric tube in  place, tip below the diaphragm not included in the field of view. Low lung volumes persist. There is bibasilar atelectasis. No consolidation to suggest pneumonia. Cardiomediastinal contours are normal. No large pleural effusion or pneumothorax. Deformity of the distal left clavicle again seen.  IMPRESSION: 1. Tip of the right central line in the region of the right atrium. 2. Low lung volumes with bibasilar atelectasis. The degree of atelectasis has increased from prior.   Electronically Signed   By: Jeb Levering M.D.   On: 10/12/2014 18:17   Dg Chest Portable 1 View  10/10/2014   CLINICAL DATA:  Placement of right internal jugular line.  EXAM: PORTABLE CHEST 1 VIEW  COMPARISON:  None.  FINDINGS: Right internal jugular central line is in place with the tip in the lower right atrium or possibly into the IVC. This is approximately 10 cm below the cavoatrial junction. Low lung volumes. Minimal bibasilar atelectasis. Heart is normal size. No acute bony abnormality.  IMPRESSION: Right internal jugular central line passes were well into the right atrium or possibly through the heart into the IVC, approximately 10 cm below the cavoatrial junction.  Low lung volumes, bibasilar atelectasis.   Electronically Signed   By: Rolm Baptise M.D.   On: 10/10/2014 13:07     CBC  Recent Labs Lab 10/10/14 1854  10/12/14 0804 10/12/14 2009 10/13/14 0500 10/14/14 0505 10/15/14 0628  WBC 18.7*  < > 6.9 3.2* 2.7* 4.6 4.7  HGB 8.5*  < > 6.1* 7.0* 6.9* 7.6* 7.9*  HCT 29.3*  < > 20.7* 23.0* 22.9* 25.3* 26.2*  PLT 394  < > 172 113* 91* 97* 123*  MCV 78.3  < > 78.1 78.5  79.0 78.3 79.4  MCH 22.7*  < > 23.0* 23.9* 23.8* 23.5* 23.9*  MCHC 29.0*  < > 29.5* 30.4 30.1 30.0 30.2  RDW 18.0*  < > 18.4* 18.0* 17.9* 18.3* 18.7*  LYMPHSABS 0.6*  --   --  0.2* 0.3* 0.4* 0.7  MONOABS 0.2  --   --  0.2 0.1 0.3 0.6  EOSABS 0.0  --   --  0.1 0.0 0.0 0.0  BASOSABS 0.0  --   --  0.0 0.0 0.0 0.0  < > = values in this interval not  displayed.  Chemistries   Recent Labs Lab 10/10/14 0710  10/10/14 1854  10/11/14 0646 10/12/14 0442 10/12/14 2009 10/13/14 0500 10/14/14 0505 10/15/14 0628  NA 137  --  133*  < > 135 134* 137 137 137 141  K 3.2*  --  3.0*  < > 3.7 2.7* 2.6* 3.4* 3.2* 3.5  CL 102  --  103  < > 113* 110 112* 114* 112* 118*  CO2 21*  --  17*  < > 14* 15* 13* 13* 16* 16*  GLUCOSE 125*  --  111*  < > 103* 113* 95 148* 195* 135*  BUN 23*  --  24*  < > 24* 25* 21* 24* 32* 34*  CREATININE 1.65*  --  1.80*  < > 1.74* 1.48* 1.33* 1.36* 1.19* 1.16*  CALCIUM 8.9  --  7.5*  < > 6.1* 5.8* 6.1* 6.5* 6.7* 7.2*  MG  --   < > 1.5*  --  1.3* 1.9  --  1.8 2.0 1.9  AST 60*  --  31  --   --  26  --  27 25  --   ALT 6*  --  9*  --   --  8*  --  13* 13*  --   ALKPHOS 97  --  68  --   --  53  --  53 59  --   BILITOT 1.4*  --  0.9  --   --  1.1  --  1.4* 0.8  --   < > = values in this interval not displayed. ------------------------------------------------------------------------------------------------------------------ estimated creatinine clearance is 46.4 mL/min (by C-G formula based on Cr of 1.16). ------------------------------------------------------------------------------------------------------------------ No results for input(s): HGBA1C in the last 72 hours. ------------------------------------------------------------------------------------------------------------------ No results for input(s): CHOL, HDL, LDLCALC, TRIG, CHOLHDL, LDLDIRECT in the last 72 hours. ------------------------------------------------------------------------------------------------------------------ No results for input(s): TSH, T4TOTAL, T3FREE, THYROIDAB in the last 72 hours.  Invalid input(s): FREET3 ------------------------------------------------------------------------------------------------------------------ No results for input(s): VITAMINB12, FOLATE, FERRITIN, TIBC, IRON, RETICCTPCT in the last 72 hours.  Coagulation  profile  Recent Labs Lab 10/10/14 0710 10/10/14 1854 10/11/14 1110 10/12/14 0804 10/12/14 2009  INR 1.24 1.49 1.95* 2.04* 1.87*    No results for input(s): DDIMER in the last 72 hours.  Cardiac Enzymes  Recent Labs Lab 10/10/14 1854 10/11/14 0037 10/11/14 0646  TROPONINI <0.03 <0.03 <0.03   ------------------------------------------------------------------------------------------------------------------ Invalid input(s): POCBNP     Time Spent in minutes  35 minutes   ELGERGAWY, DAWOOD M.D on 10/15/2014 at 10:37 AM  Between 7am to 7pm - Pager - [phone]  After 7pm go to [email or web address] - password [facility]  [facility]   Office  [phone]

## 2014-10-15 NOTE — Progress Notes (Signed)
Physical Therapy Treatment Patient Details Name: [name] MRN: [id number] DOB: 28-Apr-1955 Today's Date: 10/15/2014    History of Present Illness Pt is a 59 y/o female with a PMH of ETOH cirrhosis with worsening ascites requiring more frequent paracentesis with most recent on 9/15 with 10.5L removed. She presented to [facility] on 9/25 with a 2 day history of worsening abdominal pain and increased abdominal distention. She was transferred to [facility] for exploratory laparotomy due to bowel perforartion and ICU management.     PT Comments    Making steady progress. Pt stating that her son will be with her at home to assist.  Follow Up Recommendations  Home health PT;Supervision for mobility/OOB     Equipment Recommendations  3in1 (PT)    Recommendations for Other Services       Precautions / Restrictions Precautions Precautions: Fall Restrictions Weight Bearing Restrictions: No    Mobility  Bed Mobility Overal bed mobility: Needs Assistance Bed Mobility: Supine to Sit     Supine to sit: Supervision     General bed mobility comments: Use of rails  Transfers Overall transfer level: Needs assistance Equipment used: Rolling walker (2 wheeled) Transfers: Sit to/from Stand Sit to Stand: Min assist         General transfer comment: Verabal cues for hand placement. Assist for stability  Ambulation/Gait Ambulation/Gait assistance: Min guard Ambulation Distance (Feet): 50 Feet Assistive device: Rolling walker (2 wheeled) Gait Pattern/deviations: Step-through pattern;Step-to pattern;Decreased step length - right;Decreased step length - left Gait velocity: decr Gait velocity interpretation: Below normal speed for age/gender General Gait Details: Hesitant, guarded gait.   Stairs            Wheelchair Mobility    Modified Rankin (Stroke Patients Only)       Balance Overall balance assessment: Needs assistance Sitting-balance support: No upper extremity  supported;Feet supported Sitting balance-Leahy Scale: Good     Standing balance support: Bilateral upper extremity supported Standing balance-Leahy Scale: Poor Standing balance comment: support of walker                    Cognition Arousal/Alertness: Awake/alert Behavior During Therapy: WFL for tasks assessed/performed Overall Cognitive Status: Within Functional Limits for tasks assessed                      Exercises      General Comments        Pertinent Vitals/Pain Pain Assessment: Faces Faces Pain Scale: Hurts a little bit Pain Location: abdomen Pain Descriptors / Indicators: Operative site guarding Pain Intervention(s): Limited activity within patient's tolerance;Monitored during session    Home Living                      Prior Function            PT Goals (current goals can now be found in the care plan section) Acute Rehab PT Goals Patient Stated Goal: Be able to return home PT Goal Formulation: With patient Time For Goal Achievement: 10/21/14 Potential to Achieve Goals: Good Progress towards PT goals: Progressing toward goals    Frequency  Min 3X/week    PT Plan Current plan remains appropriate    Co-evaluation             End of Session   Activity Tolerance: Patient tolerated treatment well Patient left: in chair;with call bell/phone within reach     Time: 3710-6269 PT Time Calculation (min) (ACUTE ONLY): 21  min  Charges:  $Gait Training: 8-22 mins                    G Codes:      MAYCOCK,CARY 10/19/14, 11:14 AM [facility] PT [phone]

## 2014-10-15 NOTE — Evaluation (Signed)
Occupational Therapy Evaluation Patient Details Name: [name] MRN: [id number] DOB: 10-22-1955 Today's Date: 10/15/2014    History of Present Illness Pt is a 59 y/o female with a PMH of ETOH cirrhosis with worsening ascites requiring more frequent paracentesis with most recent on 9/15 with 10.5L removed. She presented to [facility] on 9/25 with a 2 day history of worsening abdominal pain and increased abdominal distention. She was transferred to [facility] for exploratory laparotomy due to bowel perforartion and ICU management.    Clinical Impression   Patient presenting with decreased I in self care, decreased balance, decreased functional mobility/transfers. Patient reports being independent PTA. Patient currently functioning at min A overall. Patient will benefit from acute OT to increase overall independence in the areas of ADLs, functional mobility, safety in order to safely discharge home.    Follow Up Recommendations  Home health OT;Supervision - Intermittent    Equipment Recommendations  3 in 1 bedside comode;Tub/shower bench    Recommendations for Other Services  (none)     Precautions / Restrictions Precautions Precautions: Fall Restrictions Weight Bearing Restrictions: No      Mobility Bed Mobility Overal bed mobility: Needs Assistance Bed Mobility: Supine to Sit     Supine to sit: Supervision     General bed mobility comments: Use of rails  Transfers Overall transfer level: Needs assistance   Transfers: Sit to/from Stand Sit to Stand: Min assist Stand pivot transfers: Min assist       General transfer comment: verbal cues for hand placement and technique    Balance Overall balance assessment: Needs assistance Sitting-balance support: No upper extremity supported;Feet supported Sitting balance-Leahy Scale: Good     Standing balance support: During functional activity;Single extremity supported Standing balance-Leahy Scale: Poor Standing balance comment:  reliance on RW                            ADL Overall ADL's : Needs assistance/impaired                                     Functional mobility during ADLs: Minimal assistance;Rolling walker;Cueing for safety General ADL Comments: Pt currently requires min A for sit <>stand and functional mobility/transfers. Pt ambulated 15' from bed into bathroom for toileting with use of RW and min A for balance. Pt seated on elevated toilet seat and required steady assist during hygiene and clothing management. Pt able to sit on EOB to don and doff B socks with increased time and steady assist as well.                Pertinent Vitals/Pain Pain Assessment: Faces Pain Score: 2  Pain Location: abdomen Pain Descriptors / Indicators: Guarding;Aching Pain Intervention(s): Monitored during session;Repositioned     Hand Dominance Right   Extremity/Trunk Assessment Upper Extremity Assessment Upper Extremity Assessment: Generalized weakness   Lower Extremity Assessment Lower Extremity Assessment: Defer to PT evaluation   Cervical / Trunk Assessment Cervical / Trunk Assessment: Normal   Communication Communication Communication: No difficulties   Cognition Arousal/Alertness: Awake/alert Behavior During Therapy: WFL for tasks assessed/performed Overall Cognitive Status: Within Functional Limits for tasks assessed                                Home Living Family/patient expects to be discharged to:: Private residence  Living Arrangements: Alone Available Help at Discharge: Family;Available PRN/intermittently Type of Home: House Home Access: Level entry     Home Layout: One level     Bathroom Shower/Tub: Tub/shower unit;Curtain Shower/tub characteristics: Architectural technologist: Standard Bathroom Accessibility: Yes How Accessible: Accessible via walker Home Equipment: Walker - 2 wheels          Prior Functioning/Environment Level of  Independence: Independent        Comments: Pt states she does not use RW at home.    OT Diagnosis: Generalized weakness   OT Problem List: Decreased strength;Decreased activity tolerance;Impaired balance (sitting and/or standing);Decreased knowledge of use of DME or AE;Decreased safety awareness   OT Treatment/Interventions: Self-care/ADL training;Therapeutic exercise;Balance training;Therapeutic activities;Energy conservation;DME and/or AE instruction;Patient/family education    OT Goals(Current goals can be found in the care plan section) Acute Rehab OT Goals Patient Stated Goal: to go home OT Goal Formulation: With patient Time For Goal Achievement: 11/15/2014 Potential to Achieve Goals: Fair ADL Goals Pt Will Perform Grooming: with modified independence;sitting Pt Will Perform Upper Body Bathing: with modified independence;sitting Pt Will Perform Lower Body Bathing: with modified independence;sit to/from stand Pt Will Perform Upper Body Dressing: with modified independence;sitting Pt Will Perform Lower Body Dressing: with modified independence;sit to/from stand Pt Will Transfer to Toilet: with modified independence;ambulating;stand pivot transfer;bedside commode Pt Will Perform Toileting - Clothing Manipulation and hygiene: with modified independence;sit to/from stand Pt Will Perform Tub/Shower Transfer: Tub transfer;tub bench;rolling walker;ambulating;with supervision  OT Frequency: Min 2X/week   Barriers to D/C:    none known at this time. Son present during evaluation who reports family will provide supervision once home.          End of Session Equipment Utilized During Treatment: Rolling walker;Other (comment) (drop arm commode chair)  Activity Tolerance: Patient tolerated treatment well Patient left: in bed;with call bell/phone within reach;with bed alarm set;with family/visitor present   Time: 1415-1444 OT Time Calculation (min): 29 min Charges:  OT General  Charges $OT Visit: 1 Procedure OT Evaluation $Initial OT Evaluation Tier I: 1 Procedure OT Treatments $Self Care/Home Management : 8-22 mins  Phineas Semen, MS, OTR/L 10/15/2014, 3:40 PM

## 2014-10-15 NOTE — Care Management Note (Signed)
  Patient Details  Name: TIEISHA DARDEN MRN: [id number] Date of Birth: 09/15/1955  Subjective/Objective:          Date:10-15-14 Friday Spoke with patient at the bedside. Introduced self as Tourist information centre manager and explained role in discharge planning and how to be reached. Verified patient lives in [place] [address] 1 story house,  alone. Verified patient anticipates to go  home alone,  at time of discharge and will have  Part time supervision by family  at this time to best of their knowledge. Patient has DME  walker  2 wheeled. Expressed potential need for no other DME. Patient denied needing help with their medication. Patient  is driven by daughter to MD appointments. Verified patient has PCP Dr Clide Deutscher at [facility]. Patient states they currently receive [facility] services through no one.  Patient was provided choice and selected [facility] for home health needs if they arise.  Plan: CM will continue to follow for discharge planning and [facility] resources.   Carles Collet RN BSN CM [phone] Management Note            Action/Plan:  Anticipate discharge over weekend, [facility] PT pt chose [facility].  Expected Discharge Date:                  Expected Discharge Plan:  Home/Self Care  In-House Referral:     Discharge planning Services  CM Consult  Post Acute Care Choice:    Choice offered to:     DME Arranged:    DME Agency:     HH Arranged:    HH Agency:     Status of Service:  In process, will continue to follow  Medicare Important Message Given:    Date Medicare IM Given:    Medicare IM give by:    Date Additional Medicare IM Given:    Additional Medicare Important Message give by:     If discussed at [place] of Stay Meetings, dates discussed:    Additional Comments:  Carles Collet, RN 10/15/2014, 10:38 AM

## 2014-10-15 NOTE — Progress Notes (Signed)
[facility] Progress Note  5 Days Post-Op  Subjective: Pt feels good, little pain.  Abdomen distended from ascites.  Tolerating fulls, wants soft diet.  Ambulating OOB some.  Wants to work with therapy.  Having flatus and had 1 BM yesterday.    Objective: Vital signs in last 24 hours: Temp:  [97 F (36.1 C)-98.1 F (36.7 C)] 98.1 F (36.7 C) (09/30 0501) Pulse Rate:  [68-127] 80 (09/30 0501) Resp:  [12-25] 16 (09/30 0501) BP: (85-108)/(44-68) 98/58 mmHg (09/30 0501) SpO2:  [100 %] 100 % (09/30 0501) Weight:  [62.596 kg (138 lb)] 62.596 kg (138 lb) (09/30 0432) Last BM Date: 10/13/17  Intake/Output from previous day: 09/29 0701 - 09/30 0700 In: 1520 [P.O.:780; I.V.:40; IV Piggyback:700] Out: 386 [Urine:385; Stool:1] Intake/Output this shift:    PE: Gen:  Alert, NAD, pleasant Abd: Soft, distended, ascites noted, mild TTP over incision site, great BS heard, no HSM, midline wound 1cm wide at widest point with beefy red granulation tissue formation, minimal depth   Lab Results:   Recent Labs  10/14/14 0505 10/15/14 0628  WBC 4.6 4.7  HGB 7.6* 7.9*  HCT 25.3* 26.2*  PLT 97* 123*   BMET  Recent Labs  10/14/14 0505 10/15/14 0628  NA 137 141  K 3.2* 3.5  CL 112* 118*  CO2 16* 16*  GLUCOSE 195* 135*  BUN 32* 34*  CREATININE 1.19* 1.16*  CALCIUM 6.7* 7.2*   PT/INR  Recent Labs  10/12/14 2009  LABPROT 21.5*  INR 1.87*   CMP     Component Value Date/Time   NA 141 10/15/2014 0628   NA 145 01/26/2014 0615   K 3.5 10/15/2014 0628   K 4.2 01/26/2014 0615   CL 118* 10/15/2014 0628   CL 116* 01/26/2014 0615   CO2 16* 10/15/2014 0628   CO2 25 01/26/2014 0615   GLUCOSE 135* 10/15/2014 0628   GLUCOSE 102* 01/26/2014 0615   BUN 34* 10/15/2014 0628   BUN 4* 01/26/2014 0615   CREATININE 1.16* 10/15/2014 0628   CREATININE 0.68 01/26/2014 0615   CALCIUM 7.2* 10/15/2014 0628   CALCIUM 7.4* 01/26/2014 0615   PROT 4.6* 10/14/2014 0505   PROT 5.9*  01/26/2014 0615   ALBUMIN 1.8* 10/15/2014 0628   ALBUMIN 2.3* 01/26/2014 0615   AST 25 10/14/2014 0505   AST 207* 01/26/2014 0615   ALT 13* 10/14/2014 0505   ALT 54 01/26/2014 0615   ALKPHOS 59 10/14/2014 0505   ALKPHOS 252* 01/26/2014 0615   BILITOT 0.8 10/14/2014 0505   BILITOT 0.9 01/26/2014 0615   GFRNONAA 50* 10/15/2014 0628   GFRNONAA >60 01/26/2014 0615   GFRAA 58* 10/15/2014 0628   GFRAA >60 01/26/2014 0615   Lipase     Component Value Date/Time   LIPASE 29 10/10/2014 1854       Studies/Results: No results found.  Anti-infectives: Anti-infectives    Start     Dose/Rate Route Frequency Ordered Stop   10/15/14 0000  vancomycin (VANCOCIN) IVPB 750 mg/150 ml premix     750 mg 150 mL/hr over 60 Minutes Intravenous Every 24 hours 10/14/14 2052     10/13/14 1800  aztreonam (AZACTAM) 1 g in dextrose 5 % 50 mL IVPB     1 g 100 mL/hr over 30 Minutes Intravenous Every 8 hours 10/13/14 0935     10/11/14 2000  vancomycin (VANCOCIN) IVPB 1000 mg/200 mL premix  Status:  Discontinued     1,000 mg 200 mL/hr over 60 Minutes  Intravenous Every 24 hours 10/10/14 1900 10/14/14 2052   10/10/14 2000  metroNIDAZOLE (FLAGYL) IVPB 500 mg     500 mg 100 mL/hr over 60 Minutes Intravenous Every 8 hours 10/10/14 1900     10/10/14 2000  vancomycin (VANCOCIN) 1,500 mg in sodium chloride 0.9 % 500 mL IVPB     1,500 mg 250 mL/hr over 120 Minutes Intravenous  Once 10/10/14 1900 10/11/14 0040   10/10/14 2000  aztreonam (AZACTAM) 2 g in dextrose 5 % 50 mL IVPB  Status:  Discontinued     2 g 100 mL/hr over 30 Minutes Intravenous Every 12 hours 10/10/14 1900 10/13/14 0935   10/10/14 1900  aztreonam (AZACTAM) 2 g in dextrose 5 % 50 mL IVPB  Status:  Discontinued     2 g 100 mL/hr over 30 Minutes Intravenous 4 times per day 10/10/14 1846 10/10/14 1900   10/10/14 1845  vancomycin (VANCOCIN) IVPB 1000 mg/200 mL premix  Status:  Discontinued     1,000 mg 200 mL/hr over 60 Minutes Intravenous Every  12 hours 10/10/14 1844 10/10/14 1900       Assessment/Plan POD#5 exploratory laparotomy, graham patch repair of pre pyloric gastric ulcer, primary umbilical repair---Dr. Barry Dienes -On fulls, advance to soft -Bowel regimen added -PT/OT eval -PPI needed at discharge -PO pain meds -BID wet to dry dressing changes to 1cm wide wound ID-Aztreonam/flagyl D#5/7 for sepsis/peritonitis, Could likely stop Vanc, cultures NGTD FEN-advance diet as tolerated External hemorrhoids-non thrombosed, agree with hemorrhoids. Avoid constipation/straining. Umbilical hernia-soft and reducible. May get worse given ascites VTE prophylaxis-SCDs, coagulopathic INR 1.8 09/12/14 Disp-advancing diet, therapies, home soon    LOS: 5 days    Nat Christen 10/15/2014, 8:18 AM Pager: [phone]

## 2014-10-15 NOTE — Progress Notes (Signed)
Patient Blood Bank number written down by phlebotomy does not match blood bank number on patient blood ID band.   Ginger RN and myself verified the correct blood bank number on the patient blood ID band. Blood bank to re-order type and screen and start process of verification over again.

## 2014-10-15 NOTE — Consult Note (Signed)
ANTIBIOTIC CONSULT NOTE - FOLLOW UP  Pharmacy Consult for vancomycin, Aztreonam  Indication: peritonitis  Allergies  Allergen Reactions  . Penicillins Hives    Has patient had a PCN reaction causing immediate rash, facial/tongue/throat swelling, SOB or lightheadedness with hypotension: Yes Has patient had a PCN reaction causing severe rash involving mucus membranes or skin necrosis: No Has patient had a PCN reaction that required hospitalization No Has patient had a PCN reaction occurring within the last 10 years: No If all of the above answers are "NO", then may proceed with Cephalosporin use.    Patient Measurements: Height: 5\' 5"  (165.1 cm) Weight: 138 lb (62.596 kg) IBW/kg (Calculated) : 57  Vital Signs: Temp: 98.1 F (36.7 C) (09/30 0800) Temp Source: Oral (09/30 0800) BP: 110/61 mmHg (09/30 0800) Pulse Rate: 83 (09/30 0800) Intake/Output from previous day: 09/29 0701 - 09/30 0700 In: 1520 [P.O.:780; I.V.:40; IV Piggyback:700] Out: 386 [Urine:385; Stool:1] Intake/Output from this shift: Total I/O In: 240 [P.O.:240] Out: 120 [Urine:120]  Labs:  Recent Labs  10/13/14 0500 10/14/14 0505 10/15/14 0628  WBC 2.7* 4.6 4.7  HGB 6.9* 7.6* 7.9*  PLT 91* 97* 123*  CREATININE 1.36* 1.19* 1.16*   Estimated Creatinine Clearance: 46.4 mL/min (by C-G formula based on Cr of 1.16).  Recent Labs  10/14/14 1933  [place] 27*     Microbiology: Recent Results (from the past 720 hour(s))  Body fluid culture     Status: None   Collection Time: 10/10/14  8:09 AM  Result Value Ref Range Status   Specimen Description ASCITIES  Final   Special Requests NONE  Final   Gram Stain FEW WBC SEEN NO ORGANISMS SEEN   Final   Culture NO GROWTH 4 DAYS  Final   Report Status 10/14/2014 FINAL  Final  Culture, blood (routine x 2)     Status: None (Preliminary result)   Collection Time: 10/10/14  8:40 AM  Result Value Ref Range Status   Specimen Description BLOOD RIGHT HAND  Final    Special Requests BOTTLES DRAWN AEROBIC AND ANAEROBIC  1CC  Final   Culture NO GROWTH 4 DAYS  Final   Report Status PENDING  Incomplete  Culture, blood (routine x 2)     Status: None (Preliminary result)   Collection Time: 10/10/14  8:46 AM  Result Value Ref Range Status   Specimen Description BLOOD LEFT AC  Final   Special Requests BOTTLES DRAWN AEROBIC AND ANAEROBIC  2CC  Final   Culture NO GROWTH 4 DAYS  Final   Report Status PENDING  Incomplete  Urine culture     Status: None   Collection Time: 10/10/14  6:52 PM  Result Value Ref Range Status   Specimen Description URINE, CATHETERIZED  Final   Special Requests NONE  Final   Culture NO GROWTH 1 DAY  Final   Report Status 10/11/2014 FINAL  Final  Culture, blood (routine x 2)     Status: None (Preliminary result)   Collection Time: 10/10/14  7:23 PM  Result Value Ref Range Status   Specimen Description BLOOD RIGHT HAND  Final   Special Requests BOTTLES DRAWN AEROBIC AND ANAEROBIC 5CC  Final   Culture NO GROWTH 4 DAYS  Final   Report Status PENDING  Incomplete  Culture, blood (routine x 2)     Status: None (Preliminary result)   Collection Time: 10/10/14  7:30 PM  Result Value Ref Range Status   Specimen Description BLOOD LEFT HAND  Final  Special Requests IN PEDIATRIC BOTTLE 2CC  Final   Culture NO GROWTH 4 DAYS  Final   Report Status PENDING  Incomplete  MRSA PCR Screening     Status: Abnormal   Collection Time: 10/10/14  7:42 PM  Result Value Ref Range Status   MRSA by PCR POSITIVE (A) NEGATIVE Final    Comment:        The GeneXpert MRSA Assay (FDA approved for NASAL specimens only), is one component of a comprehensive MRSA colonization surveillance program. It is not intended to diagnose MRSA infection nor to guide or monitor treatment for MRSA infections. RESULT CALLED TO, READ BACK BY AND VERIFIED WITH: FLYNT,F RN 2158 10/10/14 MITCHELL,L     Medical History: Past Medical History  Diagnosis Date  .  Cirrhosis of liver   . Hemorrhoid    Assessment: 59 yo female who presented with ascites and abdominal pain currently on D#4 of empiric IV antibiotics for peritonitis. CT showed a perforated gastric ulcer. She is s/p ex lap with omental graham patching of pre-pyloric gastric ulcer. WBC is down to 2.7 today. Pt is afebrile. CrCl improved to ~ 40 mL/min  VT 27 on 1 gram q24 - dose not given yet tonight - will decrease dose.  Aztreonam 9/25>> Vancomycin 9/25>> Metronidazole 9/25>>  9/25 Urine>>NegF 9/25 MRSA PCR 9/25 Blood x 2>>NGTD   Goal of Therapy:  Vancomycin trough level 15-20 mcg/ml  Plan:  Decrease Vancomycin 750 mg q24h Increase Aztreonam to 1 g q8h Metronidazole 500 mg q8h Monitor clinical status, renal fx, cultures, and VT prn  [name] Pharm.D. CPP, BCPS Clinical Pharmacist [phone] 10/15/2014 10:56 AM

## 2014-10-16 ENCOUNTER — Telehealth: Payer: Self-pay | Admitting: General Surgery

## 2014-10-16 DIAGNOSIS — D62 Acute posthemorrhagic anemia: Secondary | ICD-10-CM

## 2014-10-16 DIAGNOSIS — J9601 Acute respiratory failure with hypoxia: Secondary | ICD-10-CM

## 2014-10-16 DIAGNOSIS — E43 Unspecified severe protein-calorie malnutrition: Secondary | ICD-10-CM

## 2014-10-16 DIAGNOSIS — K709 Alcoholic liver disease, unspecified: Secondary | ICD-10-CM

## 2014-10-16 DIAGNOSIS — E46 Unspecified protein-calorie malnutrition: Secondary | ICD-10-CM

## 2014-10-16 DIAGNOSIS — N179 Acute kidney failure, unspecified: Secondary | ICD-10-CM

## 2014-10-16 DIAGNOSIS — J9600 Acute respiratory failure, unspecified whether with hypoxia or hypercapnia: Secondary | ICD-10-CM

## 2014-10-16 LAB — TYPE AND SCREEN
ABO/RH(D): O POS
Antibody Screen: NEGATIVE
Unit division: 0

## 2014-10-16 LAB — CBC WITH DIFFERENTIAL/PLATELET
BASOS PCT: 0 %
Basophils Absolute: 0 10*3/uL (ref 0.0–0.1)
Eosinophils Absolute: 0.1 10*3/uL (ref 0.0–0.7)
Eosinophils Relative: 3 %
HEMATOCRIT: 29.7 % — AB (ref 36.0–46.0)
HEMOGLOBIN: 9.1 g/dL — AB (ref 12.0–15.0)
LYMPHS ABS: 0.8 10*3/uL (ref 0.7–4.0)
Lymphocytes Relative: 19 %
MCH: 24.5 pg — AB (ref 26.0–34.0)
MCHC: 30.6 g/dL (ref 30.0–36.0)
MCV: 79.8 fL (ref 78.0–100.0)
MONO ABS: 0.4 10*3/uL (ref 0.1–1.0)
MONOS PCT: 9 %
NEUTROS ABS: 2.9 10*3/uL (ref 1.7–7.7)
NEUTROS PCT: 69 %
Platelets: 111 10*3/uL — ABNORMAL LOW (ref 150–400)
RBC: 3.72 MIL/uL — ABNORMAL LOW (ref 3.87–5.11)
RDW: 18.8 % — AB (ref 11.5–15.5)
WBC: 4.2 10*3/uL (ref 4.0–10.5)

## 2014-10-16 LAB — RENAL FUNCTION PANEL
ANION GAP: 7 (ref 5–15)
Albumin: 1.8 g/dL — ABNORMAL LOW (ref 3.5–5.0)
BUN: 27 mg/dL — AB (ref 6–20)
CHLORIDE: 115 mmol/L — AB (ref 101–111)
CO2: 16 mmol/L — AB (ref 22–32)
Calcium: 7.5 mg/dL — ABNORMAL LOW (ref 8.9–10.3)
Creatinine, Ser: 1.03 mg/dL — ABNORMAL HIGH (ref 0.44–1.00)
GFR calc Af Amer: >60 mL/min (ref >60)
GFR calc non Af Amer: 58 mL/min — ABNORMAL LOW (ref >60)
GLUCOSE: 96 mg/dL (ref 65–99)
POTASSIUM: 3.5 mmol/L (ref 3.5–5.1)
Phosphorus: 3.5 mg/dL (ref 2.5–4.6)
Sodium: 138 mmol/L (ref 135–145)

## 2014-10-16 LAB — CULTURE, BLOOD (ROUTINE X 2)
Culture: NO GROWTH
Culture: NO GROWTH

## 2014-10-16 LAB — MAGNESIUM: Magnesium: 1.9 mg/dL (ref 1.7–2.4)

## 2014-10-16 MED ORDER — FERROUS SULFATE 324 (65 FE) MG PO TBEC: 1.0000 | DELAYED_RELEASE_TABLET | Freq: Two times a day (BID) | ORAL | Status: DC

## 2014-10-16 MED ORDER — ENSURE COMPLETE PO LIQD: 237.0000 mL | Freq: Two times a day (BID) | ORAL | Status: AC

## 2014-10-16 MED ORDER — SODIUM CHLORIDE 0.9 % IJ SOLN
10.0000 mL | INTRAMUSCULAR | Status: DC | PRN
  Administered 2014-10-16: 20 mL

## 2014-10-16 MED ORDER — PANTOPRAZOLE SODIUM 40 MG PO TBEC: 40.0000 mg | DELAYED_RELEASE_TABLET | Freq: Two times a day (BID) | ORAL | Status: AC

## 2014-10-16 MED ORDER — POLYETHYLENE GLYCOL 3350 17 G PO PACK: 17.0000 g | PACK | Freq: Every day | ORAL | Status: AC | PRN

## 2014-10-16 MED ORDER — METRONIDAZOLE 500 MG PO TABS: 500.0000 mg | ORAL_TABLET | Freq: Three times a day (TID) | ORAL | Status: DC

## 2014-10-16 MED ORDER — CIPROFLOXACIN HCL 500 MG PO TABS: 500.0000 mg | ORAL_TABLET | Freq: Two times a day (BID) | ORAL | Status: DC

## 2014-10-16 NOTE — Discharge Instructions (Signed)
Dressing change:  Could use home health or family can do dressing changes since wound has minimal depth.  Dressing changes once daily with wet to dry 4x4 gauze opened up completely to line the red part of the wound.  Dry gauze on top and tape.  She can shower with wound open after discharge.  Repack wound after showers.  No soaking in bathtubs, hot tub, body's of water, or swimming pools.    [facility], [place] [phone]  OPEN ABDOMINAL SURGERY: POST OP INSTRUCTIONS  Always review your discharge instruction sheet given to you by the facility where your surgery was performed.  IF YOU HAVE DISABILITY OR FAMILY LEAVE FORMS, YOU MUST BRING THEM TO THE OFFICE FOR PROCESSING.  PLEASE DO NOT GIVE THEM TO YOUR DOCTOR.  1. A prescription for pain medication may be given to you upon discharge.  Take your pain medication as prescribed, if needed.  If narcotic pain medicine is not needed, then you may take acetaminophen (Tylenol) or ibuprofen (Advil) as needed. 2. Take your usually prescribed medications unless otherwise directed. 3. If you need a refill on your pain medication, please contact your pharmacy. They will contact our office to request authorization.  Prescriptions will not be filled after 5pm or on week-ends. 4. You should follow a light diet the first few days after arrival home, such as soup and crackers, pudding, etc.unless your doctor has advised otherwise. A high-fiber, low fat diet can be resumed as tolerated.   Be sure to include lots of fluids daily. Most patients will experience some swelling and bruising on the chest and neck area.  Ice packs will help.  Swelling and bruising can take several days to resolve 5. Most patients will experience some swelling and bruising in the area of the incision. Ice pack will help. Swelling and bruising can take several days to resolve..  6. It is common to experience some constipation if taking pain medication after surgery.   Increasing fluid intake and taking a stool softener will usually help or prevent this problem from occurring.  A mild laxative (Milk of Magnesia or Miralax) should be taken according to package directions if there are no bowel movements after 48 hours. 7.  You may have steri-strips (small skin tapes) in place directly over the incision.  These strips should be left on the skin for 7-10 days.  If your surgeon used skin glue on the incision, you may shower in 24 hours.  The glue will flake off over the next 2-3 weeks.  Any sutures or staples will be removed at the office during your follow-up visit. You may find that a light gauze bandage over your incision may keep your staples from being rubbed or pulled. You may shower and replace the bandage daily. 8. ACTIVITIES:  You may resume regular (light) daily activities beginning the next day--such as daily self-care, walking, climbing stairs--gradually increasing activities as tolerated.  You may have sexual intercourse when it is comfortable.  Refrain from any heavy lifting or straining until approved by your doctor. a. You may drive when you no longer are taking prescription pain medication, you can comfortably wear a seatbelt, and you can safely maneuver your car and apply brakes b. Return to Work: ___________________________________ 94. You should see your doctor in the office for a follow-up appointment approximately two weeks after your surgery.  Make sure that you call for this appointment within a day or two after you arrive  home to insure a convenient appointment time. OTHER INSTRUCTIONS:  _____________________________________________________________ _____________________________________________________________  WHEN TO CALL YOUR DOCTOR: 1. Fever over 101.0 2. Inability to urinate 3. Nausea and/or vomiting 4. Extreme swelling or bruising 5. Continued bleeding from incision. 6. Increased pain, redness, or drainage from the incision. 7. Difficulty  swallowing or breathing 8. Muscle cramping or spasms. 9. Numbness or tingling in hands or feet or around lips.  The clinic staff is available to answer your questions during regular business hours.  Please dont hesitate to call and ask to speak to one of the nurses if you have concerns.  For further questions, please visit [email or web address]

## 2014-10-16 NOTE — Progress Notes (Signed)
Please evaluate cxr done 10/12/14 for tip placement.  Radiologist suggested it be pulled back.  Thanks, Brandon Melnick, RN IV Team

## 2014-10-16 NOTE — Progress Notes (Signed)
Physical Therapy Treatment Patient Details Name: [name] MRN: [id number] DOB: 05/16/1955 Today's Date: 10/24/2014    History of Present Illness Pt is a 59 y/o female with a PMH of ETOH cirrhosis with worsening ascites requiring more frequent paracentesis with most recent on 9/15 with 10.5L removed. She presented to [facility] on 9/25 with a 2 day history of worsening abdominal pain and increased abdominal distention. She was transferred to [facility] for exploratory laparotomy due to bowel perforartion and ICU management.     PT Comments    Pt doing well. Ready for home with family from PT standpoint.  Follow Up Recommendations  Home health PT;Supervision for mobility/OOB     Equipment Recommendations  3in1 (PT)    Recommendations for Other Services       Precautions / Restrictions Precautions Precautions: Fall Restrictions Weight Bearing Restrictions: No    Mobility  Bed Mobility                  Transfers Overall transfer level: Needs assistance Equipment used: Rolling walker (2 wheeled) Transfers: Sit to/from Stand Sit to Stand: Supervision            Ambulation/Gait Ambulation/Gait assistance: Supervision Ambulation Distance (Feet): 175 Feet Assistive device: Rolling walker (2 wheeled) Gait Pattern/deviations: Step-through pattern;Decreased stride length Gait velocity: decr Gait velocity interpretation: Below normal speed for age/gender General Gait Details: More confident gait.   Stairs            Wheelchair Mobility    Modified Rankin (Stroke Patients Only)       Balance     Sitting balance-Leahy Scale: Good       Standing balance-Leahy Scale: Fair Standing balance comment:                      Cognition Arousal/Alertness: Awake/alert Behavior During Therapy: WFL for tasks assessed/performed Overall Cognitive Status: Within Functional Limits for tasks assessed                      Exercises      General  Comments        Pertinent Vitals/Pain      Home Living Family/patient expects to be discharged to:: Private residence Living Arrangements: Alone Available Help at Discharge: Family;Available PRN/intermittently Type of Home: House Home Access: Level entry   Home Layout: One level Home Equipment: Walker - 2 wheels      Prior Function Level of Independence: Independent      Comments: Pt states she does not use RW at home.   PT Goals (current goals can now be found in the care plan section) Acute Rehab PT Goals Patient Stated Goal: Be able to return home PT Goal Formulation: With patient Time For Goal Achievement: 10/21/14 Potential to Achieve Goals: Good Progress towards PT goals: Progressing toward goals    Frequency  Min 3X/week    PT Plan Current plan remains appropriate    Co-evaluation             End of Session   Activity Tolerance: Patient tolerated treatment well Patient left: in chair;with call bell/phone within reach;with chair alarm set     Time: 8177-1165 PT Time Calculation (min) (ACUTE ONLY): 16 min  Charges:  $Gait Training: 8-22 mins                    G Codes:      [name] October 24, 2014, 1:04 PM [employer] PT [phone]

## 2014-10-16 NOTE — Progress Notes (Signed)
[facility] Progress Note  6 Days Post-Op  Subjective: Pt tolerating soft diet well.  No N/V, abdominal pain wax/wanes with movement.  She's much more mobile now.  Abdomen distended with ascites.  Having flatus, BM's and good urination.    Objective: Vital signs in last 24 hours: Temp:  [97.7 F (36.5 C)-98.5 F (36.9 C)] 97.9 F (36.6 C) (10/01 0540) Pulse Rate:  [77-88] 88 (10/01 0540) Resp:  [16-18] 18 (10/01 0540) BP: (94-104)/(55-78) 99/71 mmHg (10/01 0540) SpO2:  [98 %-100 %] 100 % (10/01 0540) Weight:  [62.6 kg (138 lb 0.1 oz)] 62.6 kg (138 lb 0.1 oz) (10/01 0540) Last BM Date: 10/15/14  Intake/Output from previous day: 09/30 0701 - 10/01 0700 In: 1135 [P.O.:600; Blood:335; IV Piggyback:200] Out: 270 [Urine:270] Intake/Output this shift:    PE: Gen:  Alert, NAD, pleasant Abd: Soft, distended due to ascites, mild TTP over incision site, great BS heard, no HSM, midline wound 1cm wide at widest point with beefy red granulation tissue formation, minimal depth  Lab Results:   Recent Labs  10/15/14 0628 10/16/14 0510  WBC 4.7 4.2  HGB 7.9* 9.1*  HCT 26.2* 29.7*  PLT 123* 111*   BMET  Recent Labs  10/15/14 0628 10/16/14 0510  NA 141 138  K 3.5 3.5  CL 118* 115*  CO2 16* 16*  GLUCOSE 135* 96  BUN 34* 27*  CREATININE 1.16* 1.03*  CALCIUM 7.2* 7.5*   PT/INR No results for input(s): LABPROT, INR in the last 72 hours. CMP     Component Value Date/Time   NA 138 10/16/2014 0510   NA 145 01/26/2014 0615   K 3.5 10/16/2014 0510   K 4.2 01/26/2014 0615   CL 115* 10/16/2014 0510   CL 116* 01/26/2014 0615   CO2 16* 10/16/2014 0510   CO2 25 01/26/2014 0615   GLUCOSE 96 10/16/2014 0510   GLUCOSE 102* 01/26/2014 0615   BUN 27* 10/16/2014 0510   BUN 4* 01/26/2014 0615   CREATININE 1.03* 10/16/2014 0510   CREATININE 0.68 01/26/2014 0615   CALCIUM 7.5* 10/16/2014 0510   CALCIUM 7.4* 01/26/2014 0615   PROT 4.6* 10/14/2014 0505   PROT 5.9*  01/26/2014 0615   ALBUMIN 1.8* 10/16/2014 0510   ALBUMIN 2.3* 01/26/2014 0615   AST 25 10/14/2014 0505   AST 207* 01/26/2014 0615   ALT 13* 10/14/2014 0505   ALT 54 01/26/2014 0615   ALKPHOS 59 10/14/2014 0505   ALKPHOS 252* 01/26/2014 0615   BILITOT 0.8 10/14/2014 0505   BILITOT 0.9 01/26/2014 0615   GFRNONAA 58* 10/16/2014 0510   GFRNONAA >60 01/26/2014 0615   GFRAA >60 10/16/2014 0510   GFRAA >60 01/26/2014 0615   Lipase     Component Value Date/Time   LIPASE 29 10/10/2014 1854       Studies/Results: No results found.  Anti-infectives: Anti-infectives    Start     Dose/Rate Route Frequency Ordered Stop   10/15/14 0000  vancomycin (VANCOCIN) IVPB 750 mg/150 ml premix  Status:  Discontinued     750 mg 150 mL/hr over 60 Minutes Intravenous Every 24 hours 10/14/14 2052 10/15/14 1056   10/13/14 1800  aztreonam (AZACTAM) 1 g in dextrose 5 % 50 mL IVPB     1 g 100 mL/hr over 30 Minutes Intravenous Every 8 hours 10/13/14 0935     10/11/14 2000  vancomycin (VANCOCIN) IVPB 1000 mg/200 mL premix  Status:  Discontinued     1,000 mg 200 mL/hr over  60 Minutes Intravenous Every 24 hours 10/10/14 1900 10/14/14 2052   10/10/14 2000  metroNIDAZOLE (FLAGYL) IVPB 500 mg     500 mg 100 mL/hr over 60 Minutes Intravenous Every 8 hours 10/10/14 1900     10/10/14 2000  vancomycin (VANCOCIN) 1,500 mg in sodium chloride 0.9 % 500 mL IVPB     1,500 mg 250 mL/hr over 120 Minutes Intravenous  Once 10/10/14 1900 10/11/14 0040   10/10/14 2000  aztreonam (AZACTAM) 2 g in dextrose 5 % 50 mL IVPB  Status:  Discontinued     2 g 100 mL/hr over 30 Minutes Intravenous Every 12 hours 10/10/14 1900 10/13/14 0935   10/10/14 1900  aztreonam (AZACTAM) 2 g in dextrose 5 % 50 mL IVPB  Status:  Discontinued     2 g 100 mL/hr over 30 Minutes Intravenous 4 times per day 10/10/14 1846 10/10/14 1900   10/10/14 1845  vancomycin (VANCOCIN) IVPB 1000 mg/200 mL premix  Status:  Discontinued     1,000 mg 200 mL/hr  over 60 Minutes Intravenous Every 12 hours 10/10/14 1844 10/10/14 1900       Assessment/Plan POD #6 exploratory laparotomy, graham patch repair of pre pyloric gastric ulcer, primary umbilical repair---Dr. Barry Dienes -On soft diet -Bowel regimen added -PT/OT eval -PPI needed at discharge -PO pain meds -Daily wet to dry dressing changes to 1cm wide wound ID-Aztreonam/flagyl Day #6/7 for sepsis/peritonitis, switch to cipro/flagyl at discharge for 2 days.  Cultures NGTD FEN-orals for pain External hemorrhoids-non thrombosed, agree with hemorrhoids. Avoid constipation/straining.  Bowel regimen important Umbilical hernia-soft and reducible. May get worse given ascites VTE prophylaxis-SCDs, coagulopathic INR 1.8 09/12/14 Disp-home today from surgical perspective  Dressing change:  Could use home health or family can do dressing changes since wound has minimal depth.  Dressing changes once daily with wet to dry 4x4 gauze opened up completely to line the red part of the wound.  Dry gauze on top and tape.  She can shower with wound open after discharge.  Repack wound after showers.  No soaking in bathtubs, hot tub, body's of water, or swimming pools.      LOS: 6 days    Nat Christen 10/16/2014, 8:16 AM Pager: [phone]

## 2014-10-16 NOTE — Telephone Encounter (Signed)
Patient called stating she was having a lot of pain and was not sent home with any pain medications today.  Patient was not discharged by the surgical team.  Recommended patient return to the ED if her pain could not be controlled with OTC meds.

## 2014-10-16 NOTE — Discharge Summary (Signed)
[name], is a 59 y.o. female  DOB 1955/08/21  MRN [id number].  Admission date:  10/10/2014  Admitting Physician  Brand Males, MD  Discharge Date:  10/16/2014   Primary MD  Donnie Coffin, MD  Recommendations for primary care physician for things to follow:  - Please check CBC, BMP during next visit - Follow with surgery as an outpatient regarding her wound - she need to  continue with Protonix.   Admission Diagnosis  ULCER Gastric Ulcer   Discharge Diagnosis  ULCER Gastric Ulcer    Active Problems:   Perforation bowel   Acute respiratory failure ([place])   Acute renal failure (HCC)   Alcoholic liver disease (HCC)   Acute blood loss anemia   Protein-calorie malnutrition ([place])      Past Medical History  Diagnosis Date  . Cirrhosis of liver   . Hemorrhoid     Past Surgical History  Procedure Laterality Date  . Tonsillectomy    . Appendectomy    . Colonoscopy  2006    Normal  . Excisional hemorrhoidectomy    . Laparotomy N/A 10/10/2014    Procedure: EXPLORATORY LAPAROTOMY, OMENTAL GRAHAM PATCHING OF PRE-PYLORIC GASTRIC ULCER, AND PRIMARY UMBILICAL HERNIA REPAIR;  Surgeon: Stark Klein, MD;  Location: [place];  Service: General;  Laterality: N/A;       History of present illness and  Hospital Course:     Kindly see H&P for history of present illness and admission details, please review complete Labs, Consult reports and Test reports for all details in brief  HPI  from the history and physical done on the day of admission  59yo female smoker with hx ETOH cirrhosis with worsening ascites requiring more frequent paracentesis with most recent on 9/15 with 10.5L removed. She presented to [facility] hospital 9/25 with 2 day hx worsening abd pain and increased abd distension. She was hypotensive requiring pressors and CT abd showed free air. She was tx to [facility] 9/25 for surgical  intervention and ICU mgmt.    Hospital Course   Perforated gastric ulcer - Status post surgical repair 9/25, exploratory laparotomy, graham patch repair of pre pyloric gastric ulcer, primary umbilical repair---Dr. Barry Dienes - Continue with PPI - Tolerating soft diet . - C treated with IV antibiotics including aztreonam and Flagyl,  and vancomycin ,  cultures with no growth to date , we'll discharge on 2 days of oral Cipro and Flagyl .  Acute renal failure - Improving with IV fluids, creatinine is 1.03 on discharge.  Acute hypoxic respiratory failure - Resolved  Alcohol liver disease with ascites - Requiring paracentesis in the past, will resume back on Lasix on discharge LFTs within normal limits  Anemia - Acute blood loss anemia - Status post PRBC transfusion, on 9/2And 1 unit on 9/30, hemoglobin is 9.1 on discharge -started her on supplement on discharge.  Alcohol abuse - No signs of withdrawal, reports last drink was in May 2016   Hypokalemia/hypomagnesemia - repleted  Protein calorie malnutrition - Started on  ensure  Discharge Condition:  Stable   Follow UP  Follow-up Information    Follow up with [facility], MD. Call in 2 weeks.   Specialty:  General Surgery   Why:  For post-operation check in 2-3 weeks with Dr. Barry Dienes, call the office to confirm an appointment date/time.   Contact information:   [address] [place] [address] [phone]       Follow up with Donnie Coffin, MD.   Specialty:  Family Medicine   Why:  posthospitalization follow-up   Contact information:   [place] [address] [phone]         Discharge Instructions  and  Discharge Medications        Discharge Instructions    Diet - low sodium heart healthy    Complete by:  As directed      Discharge instructions    Complete by:  As directed   Follow with Primary MD Clide Deutscher, NGWE A, MD in 7 days   Get CBC, CMP, 2 view Chest X ray  checked  by Primary MD next visit.    Activity: As tolerated with Full fall precautions use walker/cane & assistance as needed   Disposition Home    Diet: Heart Healthy  , with feeding assistance and aspiration precautions.  For Heart failure patients - Check your Weight same time everyday, if you gain over 2 pounds, or you develop in leg swelling, experience more shortness of breath or chest pain, call your Primary MD immediately. Follow Cardiac Low Salt Diet and 1.5 lit/day fluid restriction.   On your next visit with your primary care physician please Get Medicines reviewed and adjusted.   Please request your Prim.MD to go over all Hospital Tests and Procedure/Radiological results at the follow up, please get all Hospital records sent to your Prim MD by signing hospital release before you go home.   If you experience worsening of your admission symptoms, develop shortness of breath, life threatening emergency, suicidal or homicidal thoughts you must seek medical attention immediately by calling 911 or calling your MD immediately  if symptoms less severe.  You Must read complete instructions/literature along with all the possible adverse reactions/side effects for all the Medicines you take and that have been prescribed to you. Take any new Medicines after you have completely understood and accpet all the possible adverse reactions/side effects.   Do not drive, operating heavy machinery, perform activities at heights, swimming or participation in water activities or provide baby sitting services if your were admitted for syncope or siezures until you have seen by Primary MD or a Neurologist and advised to do so again.  Do not drive when taking Pain medications.    Do not take more than prescribed Pain, Sleep and Anxiety Medications  Special Instructions: If you have smoked or chewed Tobacco  in the last 2 yrs please stop smoking, stop any regular Alcohol  and or any Recreational drug  use.  Wear Seat belts while driving.   Please note  You were cared for by a hospitalist during your hospital stay. If you have any questions about your discharge medications or the care you received while you were in the hospital after you are discharged, you can call the unit and asked to speak with the hospitalist on call if the hospitalist that took care of you is not available. Once you are discharged, your primary care physician will handle any further medical issues. Please  note that NO REFILLS for any discharge medications will be authorized once you are discharged, as it is imperative that you return to your primary care physician (or establish a relationship with a primary care physician if you do not have one) for your aftercare needs so that they can reassess your need for medications and monitor your lab values.     Discharge wound care:    Complete by:  As directed   Dressing changes once daily with wet to dry 4x4 gauze opened up completely to line the red part of the wound. Dry gauze on top and tape. She can shower with wound open after discharge. Repack wound after showers. No soaking in bathtubs, hot tub, body's of water, or swimming pools.      Increase activity slowly    Complete by:  As directed             Medication List    STOP taking these medications        albumin human 25 % bottle     spironolactone 50 MG tablet  Commonly known as:  ALDACTONE      TAKE these medications        ciprofloxacin 500 MG tablet  Commonly known as:  CIPRO  Take 1 tablet (500 mg total) by mouth 2 (two) times daily. Take for 2 days     feeding supplement (ENSURE COMPLETE) Liqd  Take 237 mLs by mouth 2 (two) times daily between meals.     ferrous sulfate 324 (65 FE) MG Tbec  Take 1 tablet (325 mg total) by mouth 2 (two) times daily.     furosemide 20 MG tablet  Commonly known as:  LASIX  Take 1 tablet (20 mg total) by mouth 2 (two) times daily.     metroNIDAZOLE 500 MG  tablet  Commonly known as:  FLAGYL  Take 1 tablet (500 mg total) by mouth 3 (three) times daily. Take for 2 days     pantoprazole 40 MG tablet  Commonly known as:  PROTONIX  Take 1 tablet (40 mg total) by mouth 2 (two) times daily.     polyethylene glycol packet  Commonly known as:  MIRALAX / GLYCOLAX  Take 17 g by mouth daily as needed for mild constipation or moderate constipation.          Diet and Activity recommendation: See Discharge Instructions above   Consults obtained -  PCCM >>TRH Gen. surgery  Major procedures and Radiology Reports - PLEASE review detailed and final reports for all details, in brief -   9/25, exploratory laparotomy, graham patch repair of pre pyloric gastric ulcer, primary umbilical repair---Dr. Barry Dienes 9/27, extubated  Ct Abdomen Pelvis Wo Contrast  10/10/2014   CLINICAL DATA:  Decrease abdominal pain, liver disease, ascites  EXAM: CT ABDOMEN AND PELVIS WITHOUT CONTRAST  TECHNIQUE: Multidetector CT imaging of the abdomen and pelvis was performed following the standard protocol without IV contrast.  COMPARISON:  Right upper quadrant ultrasound dated 05/21/2014  FINDINGS: Lower chest: Trace bilateral pleural effusions. Mild dependent atelectasis the lung bases.  Hepatobiliary: Cirrhotic configuration of the liver.  Gallbladder is unremarkable. No intrahepatic or extrahepatic ductal dilatation.  Pancreas: Within normal limits.  Spleen: Splenomegaly.  Adrenals/Urinary Tract: Adrenal glands are within normal limits.  Right kidney is notable for three nonobstructing right renal calculi measuring up to 4 mm. 1.4 cm lateral right upper pole renal cyst (series 2/ image 44).  4 mm posterior interpolar left renal cyst (series 2/  image 41).  No hydronephrosis.  Bladder is underdistended but unremarkable.  Stomach/Bowel: Contrast within the stomach. Associated extraluminal contrast in the right mid abdomen (series 2/images 26 and 29), associated with the distal gastric  antrum and proximal duodenum.  Focal contrast along the posterior aspect of the gastric antrum suggest a perforated gastric ulcer (series 2/image 33).  Scattered foci of free air with additional moderate pneumoperitoneum in the anterior abdomen (series 2/ images 41 and 62).  No evidence of bowel obstruction.  Appendix is not discretely visualized.  Mild wall thickening involving the right colon (series 2/ image 65), possibly secondary to ascites, infectious/ inflammatory colitis considered less likely but not excluded.  Colonic diverticulosis, without evidence of diverticulitis.  Vascular/Lymphatic: Atherosclerotic calcifications of the abdominal aorta and branch vessels.  No suspicious abdominopelvic lymphadenopathy.  Reproductive: Uterus is notable for multiple uterine fibroids, including a 4.6 cm calcified subserosal fundal fibroid (series 2/image 78).  No adnexal masses.  Other: Large volume abdominopelvic ascites.  Musculoskeletal: Degenerative changes of the visualized thoracolumbar spine, most prominent at L4-5.  IMPRESSION: Extraluminal contrast along the distal stomach/proximal duodenum, possibly secondary to a perforated gastric ulcer along the posterior gastric antrum.  Associated moderate pneumoperitoneum.  Large volume abdominopelvic ascites.  Additional ancillary findings as above.  Critical Value/emergent results were called by telephone at the time of interpretation on 10/10/2014 at 11:00 am to Dr. Carrie Mew, who verbally acknowledged these results.   Electronically Signed   By: Julian Hy M.D.   On: 10/10/2014 11:01   US Paracentesis  09/30/2014   INDICATION: Recurrent symptomatic ascites  EXAM: ULTRASOUND-GUIDED PARACENTESIS  COMPARISON:  Multiple prior ultrasound-guided paracenteses  MEDICATIONS: None.  COMPLICATIONS: None immediate  TECHNIQUE: Informed written consent was obtained from the patient after a discussion of the risks, benefits and alternatives to treatment. A timeout  was performed prior to the initiation of the procedure.  Initial ultrasound scanning demonstrates a moderate to large amount of ascites within the right lower abdominal quadrant. The right lower abdomen was prepped and draped in the usual sterile fashion. 1% lidocaine with epinephrine was used for local anesthesia. An ultrasound image was saved for documentation purposed. An 8 Fr Safe-T-Centesis catheter was introduced. The paracentesis was performed. The catheter was removed and a dressing was applied. The patient tolerated the procedure well without immediate post procedural complication.  FINDINGS: A total of approximately 10.8 liters of serous fluid was removed.  IMPRESSION: Successful ultrasound-guided paracentesis yielding 10.8 liters of peritoneal fluid.   Electronically Signed   By: Sandi Mariscal M.D.   On: 09/30/2014 13:23   Dg Chest Port 1 View  10/12/2014   CLINICAL DATA:  Shortness of breath, onset 15 minutes prior.  EXAM: PORTABLE CHEST 1 VIEW  COMPARISON:  10/10/2014  FINDINGS: Tip of the right central line in the region of the right atrium. Enteric tube in place, tip below the diaphragm not included in the field of view. Low lung volumes persist. There is bibasilar atelectasis. No consolidation to suggest pneumonia. Cardiomediastinal contours are normal. No large pleural effusion or pneumothorax. Deformity of the distal left clavicle again seen.  IMPRESSION: 1. Tip of the right central line in the region of the right atrium. 2. Low lung volumes with bibasilar atelectasis. The degree of atelectasis has increased from prior.   Electronically Signed   By: Jeb Levering M.D.   On: 10/12/2014 18:17   Dg Chest Portable 1 View  10/10/2014   CLINICAL DATA:  Placement of right internal  jugular line.  EXAM: PORTABLE CHEST 1 VIEW  COMPARISON:  None.  FINDINGS: Right internal jugular central line is in place with the tip in the lower right atrium or possibly into the IVC. This is approximately 10 cm below  the cavoatrial junction. Low lung volumes. Minimal bibasilar atelectasis. Heart is normal size. No acute bony abnormality.  IMPRESSION: Right internal jugular central line passes were well into the right atrium or possibly through the heart into the IVC, approximately 10 cm below the cavoatrial junction.  Low lung volumes, bibasilar atelectasis.   Electronically Signed   By: Rolm Baptise M.D.   On: 10/10/2014 13:07    Micro Results    Recent Results (from the past 240 hour(s))  Body fluid culture     Status: None   Collection Time: 10/10/14  8:09 AM  Result Value Ref Range Status   Specimen Description ASCITIES  Final   Special Requests NONE  Final   Gram Stain FEW WBC SEEN NO ORGANISMS SEEN   Final   Culture NO GROWTH 4 DAYS  Final   Report Status 10/14/2014 FINAL  Final  Culture, blood (routine x 2)     Status: None   Collection Time: 10/10/14  8:40 AM  Result Value Ref Range Status   Specimen Description BLOOD RIGHT HAND  Final   Special Requests BOTTLES DRAWN AEROBIC AND ANAEROBIC  1CC  Final   Culture NO GROWTH 6 DAYS  Final   Report Status 10/16/2014 FINAL  Final  Culture, blood (routine x 2)     Status: None   Collection Time: 10/10/14  8:46 AM  Result Value Ref Range Status   Specimen Description BLOOD LEFT AC  Final   Special Requests BOTTLES DRAWN AEROBIC AND ANAEROBIC  2CC  Final   Culture NO GROWTH 6 DAYS  Final   Report Status 10/16/2014 FINAL  Final  Urine culture     Status: None   Collection Time: 10/10/14  6:52 PM  Result Value Ref Range Status   Specimen Description URINE, CATHETERIZED  Final   Special Requests NONE  Final   Culture NO GROWTH 1 DAY  Final   Report Status 10/11/2014 FINAL  Final  Culture, blood (routine x 2)     Status: None   Collection Time: 10/10/14  7:23 PM  Result Value Ref Range Status   Specimen Description BLOOD RIGHT HAND  Final   Special Requests BOTTLES DRAWN AEROBIC AND ANAEROBIC 5CC  Final   Culture NO GROWTH 5 DAYS  Final    Report Status 10/15/2014 FINAL  Final  Culture, blood (routine x 2)     Status: None   Collection Time: 10/10/14  7:30 PM  Result Value Ref Range Status   Specimen Description BLOOD LEFT HAND  Final   Special Requests IN PEDIATRIC BOTTLE 2CC  Final   Culture NO GROWTH 5 DAYS  Final   Report Status 10/15/2014 FINAL  Final  MRSA PCR Screening     Status: Abnormal   Collection Time: 10/10/14  7:42 PM  Result Value Ref Range Status   MRSA by PCR POSITIVE (A) NEGATIVE Final    Comment:        The GeneXpert MRSA Assay (FDA approved for NASAL specimens only), is one component of a comprehensive MRSA colonization surveillance program. It is not intended to diagnose MRSA infection nor to guide or monitor treatment for MRSA infections. RESULT CALLED TO, READ BACK BY AND VERIFIED WITH: FLYNT,F RN 2158 10/10/14 MITCHELL,L  Today   Subjective:   Brandalyn Harting today has no headache,no chest abdominal pain,no new weakness tingling or numbness, feels much better wants to go home today.   Objective:   Blood pressure 99/71, pulse 88, temperature 97.9 F (36.6 C), temperature source Oral, resp. rate 18, height 5\' 5"  (1.651 m), weight 62.6 kg (138 lb 0.1 oz), SpO2 100 %.   Intake/Output Summary (Last 24 hours) at 10/16/14 1154 Last data filed at 10/16/14 0949  Gross per 24 hour  Intake    945 ml  Output      0 ml  Net    945 ml    Exam Awake Alert, Oriented X 3,  [place].AT,PERRAL Supple Neck,No JVD, No cervical lymphadenopathy appriciated.  Symmetrical Chest wall movement, Good air movement bilaterally,  RRR,No Gallops,Rubs or new Murmurs, No Parasternal Heave +ve B.Sounds, ascites+, soft, bandage covering incision site No Cyanosis, Clubbing or edema, No new Rash or bruise  Data Review   CBC w Diff:  Lab Results  Component Value Date   WBC 4.2 10/16/2014   WBC 4.8 01/25/2014   HGB 9.1* 10/16/2014   HGB 8.7* 01/25/2014   HCT 29.7* 10/16/2014   HCT 28.6* 01/25/2014     PLT 111* 10/16/2014   PLT 105* 01/25/2014   LYMPHOPCT 19 10/16/2014   LYMPHOPCT 16.6 01/25/2014   MONOPCT 9 10/16/2014   MONOPCT 6.6 01/25/2014   EOSPCT 3 10/16/2014   EOSPCT 2.6 01/25/2014   BASOPCT 0 10/16/2014   BASOPCT 0.5 01/25/2014    CMP:  Lab Results  Component Value Date   NA 138 10/16/2014   NA 145 01/26/2014   K 3.5 10/16/2014   K 4.2 01/26/2014   CL 115* 10/16/2014   CL 116* 01/26/2014   CO2 16* 10/16/2014   CO2 25 01/26/2014   BUN 27* 10/16/2014   BUN 4* 01/26/2014   CREATININE 1.03* 10/16/2014   CREATININE 0.68 01/26/2014   PROT 4.6* 10/14/2014   PROT 5.9* 01/26/2014   ALBUMIN 1.8* 10/16/2014   ALBUMIN 2.3* 01/26/2014   BILITOT 0.8 10/14/2014   BILITOT 0.9 01/26/2014   ALKPHOS 59 10/14/2014   ALKPHOS 252* 01/26/2014   AST 25 10/14/2014   AST 207* 01/26/2014   ALT 13* 10/14/2014   ALT 54 01/26/2014  .   Total Time in preparing paper work, data evaluation and todays exam - 35 minutes  Rahi Chandonnet M.D on 10/16/2014 at 11:54 AM  [facility]   Office  [phone]

## 2014-10-16 NOTE — Progress Notes (Signed)
Nsg Discharge Note  Admit Date:  10/10/2014 Discharge date: 10/16/2014   [name] to be D/C'd Home per MD order.  AVS completed.  Copy for chart, and copy for patient signed, and dated. Patient/caregiver able to verbalize understanding.  Discharge Medication:   Medication List    STOP taking these medications        albumin human 25 % bottle     spironolactone 50 MG tablet  Commonly known as:  ALDACTONE      TAKE these medications        ciprofloxacin 500 MG tablet  Commonly known as:  CIPRO  Take 1 tablet (500 mg total) by mouth 2 (two) times daily. Take for 2 days     feeding supplement (ENSURE COMPLETE) Liqd  Take 237 mLs by mouth 2 (two) times daily between meals.     ferrous sulfate 324 (65 FE) MG Tbec  Take 1 tablet (325 mg total) by mouth 2 (two) times daily.     furosemide 20 MG tablet  Commonly known as:  LASIX  Take 1 tablet (20 mg total) by mouth 2 (two) times daily.     metroNIDAZOLE 500 MG tablet  Commonly known as:  FLAGYL  Take 1 tablet (500 mg total) by mouth 3 (three) times daily. Take for 2 days     pantoprazole 40 MG tablet  Commonly known as:  PROTONIX  Take 1 tablet (40 mg total) by mouth 2 (two) times daily.     polyethylene glycol packet  Commonly known as:  MIRALAX / GLYCOLAX  Take 17 g by mouth daily as needed for mild constipation or moderate constipation.        Discharge Assessment: Filed Vitals:   10/16/14 0540  BP: 99/71  Pulse: 88  Temp: 97.9 F (36.6 C)  Resp: 18  Skin clean, dry and intact without evidence of skin break down, no evidence of skin tears noted. IV catheter discontinued by IV team intact. Site without signs and symptoms of complications - no redness or edema noted at insertion site, patient denies c/o pain - only slight tenderness at site.  Dressing with slight pressure applied.  D/c Instructions-Education: Discharge instructions given to patient/family with verbalized understanding. D/c education  completed with patient/family including follow up instructions, medication list, d/c activities limitations if indicated, with other d/c instructions as indicated by MD - patient able to verbalize understanding, all questions fully answered. Patient instructed to return to ED, call 911, or call MD for any changes in condition.  Patient escorted via [place], and D/C home via private auto.  Salley Slaughter, RN 10/16/2014 1:18 PM

## 2014-10-16 NOTE — Care Management Note (Addendum)
Case Management Note  Patient Details  Name: [name] MRN: [id number] Date of Birth: August 11, 1955  Subjective/Objective:                   Bowel perforation  Action/Plan: Discharge planning  Expected Discharge Date:  10/16/14               Expected Discharge Plan:  National  In-House Referral:     Discharge planning Services  CM Consult  Post Acute Care Choice:    Choice offered to:     DME Arranged:  3-N-1 DME Agency:  [place] Arranged:  RN, PT [facility] Agency:  [place]  Status of Service:  Completed, signed off  Medicare Important Message Given:    Date Medicare IM Given:    Medicare IM give by:    Date Additional Medicare IM Given:    Additional Medicare Important Message give by:     If discussed at [place] of Stay Meetings, dates discussed:    Additional Comments: CM confirmed with pt she is going home with [facility] rendering [facility] services.  Orders have been requested along with a face to face-placed.  Pt ordered for  HHRN/PT/aide.  Referral called to [facility] rep, Tiffany.  Unfortunately pt will not receive HHPT as Medicaid does not cover by insurance and is about 150.00 out of pocket per visit. Pt will r3eceive HHRN and aide.  CM called [facility] DME rep, Merry Proud to please deliver the 3n1 to room so pt can discharge.  No other CM needs were communicated. Dellie Catholic, RN 10/16/2014, 12:53 PM

## 2014-10-21 ENCOUNTER — Ambulatory Visit: Admission: RE | Admit: 2014-10-21 | Payer: Medicaid Other | Source: Ambulatory Visit

## 2014-10-21 ENCOUNTER — Ambulatory Visit
Admission: RE | Admit: 2014-10-21 | Discharge: 2014-10-21 | Disposition: A | Discharge status: Home / Self Care | Payer: Medicaid Other | Source: Ambulatory Visit | Attending: Gastroenterology | Admitting: Gastroenterology

## 2014-10-21 ENCOUNTER — Other Ambulatory Visit: Payer: Self-pay | Admitting: Radiology

## 2014-10-21 ENCOUNTER — Other Ambulatory Visit: Payer: Self-pay

## 2014-10-21 DIAGNOSIS — K7031 Alcoholic cirrhosis of liver with ascites: Secondary | ICD-10-CM | POA: Diagnosis present

## 2014-10-21 MED ORDER — ALBUMIN HUMAN 25 % IV SOLN
12.5000 g | Freq: Once | INTRAVENOUS | Status: AC
  Administered 2014-10-21: 12.5 g via INTRAVENOUS
  Filled 2014-10-21: qty 50

## 2014-10-21 MED ORDER — ALBUMIN HUMAN 25 % IV SOLN
25.0000 g | Freq: Once | INTRAVENOUS | Status: AC
  Administered 2014-10-21: 25 g via INTRAVENOUS
  Filled 2014-10-21: qty 100

## 2014-10-29 ENCOUNTER — Emergency Department (HOSPITAL_COMMUNITY): Payer: Medicaid Other

## 2014-10-29 ENCOUNTER — Inpatient Hospital Stay (HOSPITAL_COMMUNITY)
Admission: EM | Admit: 2014-10-29 | Discharge: 2014-11-16 | DRG: 441 | Disposition: E | Payer: Medicaid Other | Attending: Internal Medicine | Admitting: Internal Medicine

## 2014-10-29 ENCOUNTER — Encounter (HOSPITAL_COMMUNITY): Payer: Self-pay | Admitting: Neurology

## 2014-10-29 DIAGNOSIS — E46 Unspecified protein-calorie malnutrition: Secondary | ICD-10-CM

## 2014-10-29 DIAGNOSIS — E875 Hyperkalemia: Secondary | ICD-10-CM | POA: Diagnosis present

## 2014-10-29 DIAGNOSIS — Z79899 Other long term (current) drug therapy: Secondary | ICD-10-CM

## 2014-10-29 DIAGNOSIS — L8990 Pressure ulcer of unspecified site, unspecified stage: Secondary | ICD-10-CM | POA: Diagnosis present

## 2014-10-29 DIAGNOSIS — Y838 Other surgical procedures as the cause of abnormal reaction of the patient, or of later complication, without mention of misadventure at the time of the procedure: Secondary | ICD-10-CM | POA: Diagnosis present

## 2014-10-29 DIAGNOSIS — T8131XA Disruption of external operation (surgical) wound, not elsewhere classified, initial encounter: Secondary | ICD-10-CM | POA: Diagnosis present

## 2014-10-29 DIAGNOSIS — R0682 Tachypnea, not elsewhere classified: Secondary | ICD-10-CM | POA: Diagnosis not present

## 2014-10-29 DIAGNOSIS — K767 Hepatorenal syndrome: Secondary | ICD-10-CM | POA: Diagnosis present

## 2014-10-29 DIAGNOSIS — K7290 Hepatic failure, unspecified without coma: Secondary | ICD-10-CM | POA: Diagnosis present

## 2014-10-29 DIAGNOSIS — R680 Hypothermia, not associated with low environmental temperature: Secondary | ICD-10-CM | POA: Diagnosis present

## 2014-10-29 DIAGNOSIS — D62 Acute posthemorrhagic anemia: Secondary | ICD-10-CM | POA: Diagnosis present

## 2014-10-29 DIAGNOSIS — Z880 Allergy status to penicillin: Secondary | ICD-10-CM

## 2014-10-29 DIAGNOSIS — K7031 Alcoholic cirrhosis of liver with ascites: Secondary | ICD-10-CM | POA: Diagnosis present

## 2014-10-29 DIAGNOSIS — K567 Ileus, unspecified: Secondary | ICD-10-CM | POA: Diagnosis present

## 2014-10-29 DIAGNOSIS — K709 Alcoholic liver disease, unspecified: Secondary | ICD-10-CM

## 2014-10-29 DIAGNOSIS — R140 Abdominal distension (gaseous): Secondary | ICD-10-CM

## 2014-10-29 DIAGNOSIS — F17210 Nicotine dependence, cigarettes, uncomplicated: Secondary | ICD-10-CM | POA: Diagnosis present

## 2014-10-29 DIAGNOSIS — N179 Acute kidney failure, unspecified: Secondary | ICD-10-CM | POA: Diagnosis present

## 2014-10-29 DIAGNOSIS — R627 Adult failure to thrive: Secondary | ICD-10-CM | POA: Diagnosis present

## 2014-10-29 DIAGNOSIS — R64 Cachexia: Secondary | ICD-10-CM | POA: Diagnosis present

## 2014-10-29 DIAGNOSIS — Z23 Encounter for immunization: Secondary | ICD-10-CM | POA: Diagnosis not present

## 2014-10-29 DIAGNOSIS — Z8711 Personal history of peptic ulcer disease: Secondary | ICD-10-CM | POA: Diagnosis not present

## 2014-10-29 DIAGNOSIS — N200 Calculus of kidney: Secondary | ICD-10-CM | POA: Diagnosis present

## 2014-10-29 DIAGNOSIS — R188 Other ascites: Secondary | ICD-10-CM

## 2014-10-29 DIAGNOSIS — R6511 Systemic inflammatory response syndrome (SIRS) of non-infectious origin with acute organ dysfunction: Secondary | ICD-10-CM | POA: Diagnosis present

## 2014-10-29 DIAGNOSIS — Z6822 Body mass index (BMI) 22.0-22.9, adult: Secondary | ICD-10-CM

## 2014-10-29 DIAGNOSIS — E871 Hypo-osmolality and hyponatremia: Secondary | ICD-10-CM | POA: Diagnosis present

## 2014-10-29 DIAGNOSIS — E43 Unspecified severe protein-calorie malnutrition: Secondary | ICD-10-CM | POA: Diagnosis present

## 2014-10-29 DIAGNOSIS — Z66 Do not resuscitate: Secondary | ICD-10-CM | POA: Diagnosis present

## 2014-10-29 DIAGNOSIS — K255 Chronic or unspecified gastric ulcer with perforation: Secondary | ICD-10-CM

## 2014-10-29 LAB — CREATININE, URINE, RANDOM: Creatinine, Urine: 95.4 mg/dL

## 2014-10-29 LAB — CBC WITH DIFFERENTIAL/PLATELET
BASOS ABS: 0 10*3/uL (ref 0.0–0.1)
BASOS PCT: 0 %
Eosinophils Absolute: 0.1 10*3/uL (ref 0.0–0.7)
Eosinophils Relative: 1 %
HEMATOCRIT: 20.4 % — AB (ref 36.0–46.0)
HEMOGLOBIN: 6.5 g/dL — AB (ref 12.0–15.0)
LYMPHS PCT: 9 %
Lymphs Abs: 1 10*3/uL (ref 0.7–4.0)
MCH: 24.9 pg — AB (ref 26.0–34.0)
MCHC: 31.9 g/dL (ref 30.0–36.0)
MCV: 78.2 fL (ref 78.0–100.0)
MONOS PCT: 9 %
Monocytes Absolute: 1 10*3/uL (ref 0.1–1.0)
NEUTROS ABS: 9.2 10*3/uL — AB (ref 1.7–7.7)
Neutrophils Relative %: 81 %
Platelets: 168 10*3/uL (ref 150–400)
RBC: 2.61 MIL/uL — ABNORMAL LOW (ref 3.87–5.11)
RDW: 24.4 % — ABNORMAL HIGH (ref 11.5–15.5)
WBC: 11.3 10*3/uL — ABNORMAL HIGH (ref 4.0–10.5)

## 2014-10-29 LAB — I-STAT CG4 LACTIC ACID, ED
Lactic Acid, Venous: 1.33 mmol/L (ref 0.5–2.0)
Lactic Acid, Venous: 1.42 mmol/L (ref 0.5–2.0)

## 2014-10-29 LAB — POC OCCULT BLOOD, ED: Fecal Occult Bld: NEGATIVE

## 2014-10-29 LAB — URINALYSIS, ROUTINE W REFLEX MICROSCOPIC
GLUCOSE, UA: NEGATIVE mg/dL
Ketones, ur: 15 mg/dL — AB
Nitrite: NEGATIVE
PH: 5 (ref 5.0–8.0)
PROTEIN: NEGATIVE mg/dL
SPECIFIC GRAVITY, URINE: 1.016 (ref 1.005–1.030)
Urobilinogen, UA: 0.2 mg/dL (ref 0.0–1.0)

## 2014-10-29 LAB — BRAIN NATRIURETIC PEPTIDE: B Natriuretic Peptide: 172.4 pg/mL — ABNORMAL HIGH (ref 0.0–100.0)

## 2014-10-29 LAB — COMPREHENSIVE METABOLIC PANEL
ALBUMIN: 1.7 g/dL — AB (ref 3.5–5.0)
ALK PHOS: 258 U/L — AB (ref 38–126)
ALT: 20 U/L (ref 14–54)
AST: 61 U/L — ABNORMAL HIGH (ref 15–41)
Anion gap: 11 (ref 5–15)
BILIRUBIN TOTAL: 2.3 mg/dL — AB (ref 0.3–1.2)
BUN: 115 mg/dL — ABNORMAL HIGH (ref 6–20)
CALCIUM: 7.8 mg/dL — AB (ref 8.9–10.3)
CO2: 20 mmol/L — ABNORMAL LOW (ref 22–32)
Chloride: 102 mmol/L (ref 101–111)
Creatinine, Ser: 5.16 mg/dL — ABNORMAL HIGH (ref 0.44–1.00)
GFR calc Af Amer: 10 mL/min — ABNORMAL LOW (ref >60)
GFR calc non Af Amer: 8 mL/min — ABNORMAL LOW (ref >60)
GLUCOSE: 105 mg/dL — AB (ref 65–99)
Potassium: 5.2 mmol/L — ABNORMAL HIGH (ref 3.5–5.1)
Sodium: 133 mmol/L — ABNORMAL LOW (ref 135–145)
TOTAL PROTEIN: 6 g/dL — AB (ref 6.5–8.1)

## 2014-10-29 LAB — NA AND K (SODIUM & POTASSIUM), RAND UR: Potassium Urine: 63 mmol/L

## 2014-10-29 LAB — URINE MICROSCOPIC-ADD ON

## 2014-10-29 LAB — PROTIME-INR
INR: 1.77 — AB (ref 0.00–1.49)
Prothrombin Time: 20.6 seconds — ABNORMAL HIGH (ref 11.6–15.2)

## 2014-10-29 LAB — PREPARE RBC (CROSSMATCH)

## 2014-10-29 LAB — LIPASE, BLOOD: Lipase: 30 U/L (ref 22–51)

## 2014-10-29 LAB — GRAM STAIN

## 2014-10-29 MED ORDER — SODIUM CHLORIDE 0.9 % IV SOLN
10.0000 mL/h | Freq: Once | INTRAVENOUS | Status: AC
  Administered 2014-10-29: 10 mL/h via INTRAVENOUS

## 2014-10-29 MED ORDER — VANCOMYCIN HCL IN DEXTROSE 1-5 GM/200ML-% IV SOLN
1000.0000 mg | INTRAVENOUS | Status: DC
  Filled 2014-10-29: qty 200

## 2014-10-29 MED ORDER — VANCOMYCIN HCL 10 G IV SOLR
1250.0000 mg | Freq: Once | INTRAVENOUS | Status: AC
  Administered 2014-10-29: 1250 mg via INTRAVENOUS
  Filled 2014-10-29: qty 1250

## 2014-10-29 MED ORDER — FENTANYL CITRATE (PF) 100 MCG/2ML IJ SOLN
25.0000 ug | INTRAMUSCULAR | Status: DC | PRN
  Administered 2014-10-29: 25 ug via INTRAVENOUS
  Administered 2014-10-30: 50 ug via INTRAVENOUS
  Administered 2014-10-30 – 2014-10-31 (×4): 25 ug via INTRAVENOUS
  Administered 2014-10-31: 50 ug via INTRAVENOUS
  Administered 2014-10-31 (×2): 25 ug via INTRAVENOUS
  Administered 2014-10-31 – 2014-11-01 (×2): 50 ug via INTRAVENOUS
  Filled 2014-10-29 (×11): qty 2

## 2014-10-29 MED ORDER — SODIUM CHLORIDE 0.9 % IV BOLUS (SEPSIS)
1000.0000 mL | Freq: Once | INTRAVENOUS | Status: AC
Start: 2014-10-29 — End: 2014-10-29
  Administered 2014-10-29: 1000 mL via INTRAVENOUS

## 2014-10-29 MED ORDER — ALBUMIN HUMAN 25 % IV SOLN
25.0000 g | Freq: Four times a day (QID) | INTRAVENOUS | Status: AC
  Administered 2014-10-30 (×2): 25 g via INTRAVENOUS
  Filled 2014-10-29 (×2): qty 100

## 2014-10-29 MED ORDER — SODIUM CHLORIDE 0.9 % IV BOLUS (SEPSIS)
500.0000 mL | Freq: Once | INTRAVENOUS | Status: AC
  Administered 2014-10-29: 500 mL via INTRAVENOUS

## 2014-10-29 MED ORDER — IOHEXOL 300 MG/ML  SOLN: 25.0000 mL | INTRAMUSCULAR | Status: AC

## 2014-10-29 MED ORDER — SODIUM CHLORIDE 0.9 % IJ SOLN
3.0000 mL | Freq: Two times a day (BID) | INTRAMUSCULAR | Status: DC
  Administered 2014-10-30: 3 mL via INTRAVENOUS
  Administered 2014-10-30: 01:00:00 via INTRAVENOUS

## 2014-10-29 MED ORDER — DEXTROSE 5 % IV SOLN
2.0000 g | INTRAVENOUS | Status: DC
  Administered 2014-10-29: 2 g via INTRAVENOUS
  Filled 2014-10-29 (×2): qty 2

## 2014-10-29 NOTE — ED Provider Notes (Signed)
CSN: [id number]     Arrival date & time 10/23/2014  1358 History   First MD Initiated Contact with Patient 10/24/2014 1458     Chief Complaint  Patient presents with  . Wound Check     (Consider location/radiation/quality/duration/timing/severity/associated sxs/prior Treatment) Patient is a 59 y.o. female presenting with general illness. The history is provided by the patient, medical records and the EMS personnel.  Illness Location:  Abdomen Quality:  Wound leakage, abdominal distension and diffuse pain  Severity:  Severe Onset quality:  Gradual Duration:  3 days Timing:  Constant Progression:  Worsening Chronicity:  New Context:  Pt with hx ETOH cirrhosis, SBP, recent repair of perforated gastric ulcer with wound left open with wide sutures, has been having dressing changes by home RN who noticed change in draiange  Associated symptoms: abdominal pain   Associated symptoms: no chest pain, no diarrhea, no fever, no rhinorrhea, no shortness of breath and no vomiting     Past Medical History  Diagnosis Date  . Cirrhosis of liver ([place])   . Hemorrhoid    Past Surgical History  Procedure Laterality Date  . Tonsillectomy    . Appendectomy    . Colonoscopy  2006    Normal  . Excisional hemorrhoidectomy    . Laparotomy N/A 10/10/2014    Procedure: EXPLORATORY LAPAROTOMY, OMENTAL GRAHAM PATCHING OF PRE-PYLORIC GASTRIC ULCER, AND PRIMARY UMBILICAL HERNIA REPAIR;  Surgeon: Stark Klein, MD;  Location: [facility] OR;  Service: General;  Laterality: N/A;   Family History  Problem Relation Age of Onset  . Hypertension Mother   . Dementia Father   . Colon cancer Neg Hx   . Alcohol abuse Brother   . Alcohol abuse Brother   . Alcohol abuse Brother    Social History  Substance Use Topics  . Smoking status: Current Every Day Smoker -- 0.50 packs/day for 40 years    Types: Cigarettes  . Smokeless tobacco: Never Used  . Alcohol Use: No     Comment: quit May 2016   OB History    Gravida Para  Term Preterm AB TAB SAB Ectopic Multiple Living   4 3 3  0 0 0 0 0 0 0     Review of Systems  Constitutional: Negative for fever.  HENT: Negative for rhinorrhea.   Eyes: Negative for visual disturbance.  Respiratory: Negative for shortness of breath.   Cardiovascular: Negative for chest pain.  Gastrointestinal: Positive for abdominal pain and abdominal distention. Negative for vomiting and diarrhea.  Genitourinary: Negative for decreased urine volume.  Skin: Positive for wound.  Allergic/Immunologic: Negative for immunocompromised state.  Neurological: Negative for syncope.  Psychiatric/Behavioral: Positive for confusion.      Allergies  Penicillins  Home Medications   Prior to Admission medications   Medication Sig Start Date End Date Taking? Authorizing Provider  ciprofloxacin (CIPRO) 500 MG tablet Take 1 tablet (500 mg total) by mouth 2 (two) times daily. Take for 2 days 10/16/14   Albertine Patricia, MD  feeding supplement, ENSURE COMPLETE, (ENSURE COMPLETE) LIQD Take 237 mLs by mouth 2 (two) times daily between meals. 10/16/14   Silver Huguenin Elgergawy, MD  ferrous sulfate 324 (65 FE) MG TBEC Take 1 tablet (325 mg total) by mouth 2 (two) times daily. 10/16/14   Silver Huguenin Elgergawy, MD  furosemide (LASIX) 20 MG tablet Take 1 tablet (20 mg total) by mouth 2 (two) times daily. 05/25/14   Dustin Flock, MD  metroNIDAZOLE (FLAGYL) 500 MG tablet Take 1 tablet (  500 mg total) by mouth 3 (three) times daily. Take for 2 days 10/16/14   Albertine Patricia, MD  OxyCODONE HCl (OXYCONTIN PO) Take 10 mg by mouth every 4 (four) hours as needed.    Historical Provider, MD  pantoprazole (PROTONIX) 40 MG tablet Take 1 tablet (40 mg total) by mouth 2 (two) times daily. 10/16/14   Silver Huguenin Elgergawy, MD  polyethylene glycol (MIRALAX / GLYCOLAX) packet Take 17 g by mouth daily as needed for mild constipation or moderate constipation. 10/16/14   Silver Huguenin Elgergawy, MD  spironolactone (ALDACTONE) 50 MG tablet  Take 50 mg by mouth once.    Historical Provider, MD   BP 90/35 mmHg  Pulse 96  Temp(Src) 97.6 F (36.4 C) (Oral)  Resp 15  SpO2 100% Physical Exam  Constitutional: She appears well-developed and well-nourished. No distress.  HENT:  Head: Normocephalic and atraumatic.  Eyes: Right eye exhibits no discharge. Left eye exhibits no discharge.  Neck: No tracheal deviation present.  Cardiovascular: Normal rate and regular rhythm.   Pulmonary/Chest: Effort normal and breath sounds normal. No respiratory distress.  Abdominal: She exhibits distension. There is tenderness.  Abdominal wound with serosanguinous drainage  Genitourinary: Guaiac negative stool (brown stool).  Musculoskeletal: She exhibits no edema.  Neurological: She is alert.  Oriented to name, place, not time, recent memory somewhat impaired  Skin: Skin is warm and dry.  Psychiatric: She has a normal mood and affect. Her behavior is normal.  Nursing note and vitals reviewed.   ED Course  Procedures (including critical care time) Labs Review Labs Reviewed  CBC WITH DIFFERENTIAL/PLATELET  COMPREHENSIVE METABOLIC PANEL  LIPASE, BLOOD  URINALYSIS, ROUTINE W REFLEX MICROSCOPIC (NOT AT [facility])  I-STAT CG4 LACTIC ACID, ED    Imaging Review No results found. I have personally reviewed and evaluated these images and lab results as part of my medical decision-making.   EKG Interpretation   Date/Time:  Friday October 29 2014 18:01:18 EDT Ventricular Rate:  103 PR Interval:  141 QRS Duration: 77 QT Interval:  388 QTC Calculation: 508 R Axis:   43 Text Interpretation:  Sinus tachycardia Probable anterior infarct, acute  ST elevation, consider inferior injury Lateral leads are also involved  Prolonged QT interval Baseline wander in lead(s) V4 tachycardia new since  EKG in 01/24/14 but otherwise no other changes Confirmed by LITTLE MD,  RACHEL [phone]) on 10/28/2014 6:17:17 PM      MDM   Final diagnoses:  Abdominal  distension  Acute renal failure, unspecified acute renal failure type (HCC)  Alcoholic liver disease (HCC)  Acute blood loss anemia    59 yo F with hx ETOH cirrhosis, recent perforated ulcer repair presenting with abdominal pain and distension, increased drainage from wound as above. Found to be hypothermic to 95 degrees, hypotensive below baseline low BP. Workup notable for large volume ascites on CT with Hgb 6.5 (neg hemoccult), acute renal failure with Cr over 5. Slightly hyperkalemic but no EKG changes. Cultures and abx done for possible sepsis in setting of persistent hypotension, SIRS criteria, and abdominal wound and ascites. Surgery consulted-will evaluate wound. Admitted to hospitalist service for further management.   Case discussed with Dr. Rex Kras, who oversaw management of this patient.    Ivin Booty, MD 11/15/14 2125  Sharlett Iles, MD 11/03/14 [phone]

## 2014-10-29 NOTE — Progress Notes (Signed)
ANTIBIOTIC CONSULT NOTE - INITIAL  Pharmacy Consult for Ceftazidime and Vancomycin Indication: Sepsis  Allergies  Allergen Reactions  . Penicillins Hives    Has patient had a PCN reaction causing immediate rash, facial/tongue/throat swelling, SOB or lightheadedness with hypotension: Yes Has patient had a PCN reaction causing severe rash involving mucus membranes or skin necrosis: No Has patient had a PCN reaction that required hospitalization No Has patient had a PCN reaction occurring within the last 10 years: No If all of the above answers are "NO", then may proceed with Cephalosporin use.    Patient Measurements:   TBW 62.6 kg  Vital Signs: Temp: 97.4 F (36.3 C) (10/14 1828) Temp Source: Oral (10/14 1828) BP: 94/43 mmHg (10/14 2115) Pulse Rate: 100 (10/14 2115) Intake/Output from previous day:   Intake/Output from this shift:    Labs:  Recent Labs  11/02/2014 1442 10/21/2014 1735  WBC 11.3*  --   HGB 6.5*  --   PLT 168  --   LABCREA  --  95.40  CREATININE 5.16*  --    Estimated Creatinine Clearance: 10.4 mL/min (by C-G formula based on Cr of 5.16). No results for input(s): VANCOTROUGH, VANCOPEAK, VANCORANDOM, GENTTROUGH, GENTPEAK, GENTRANDOM, TOBRATROUGH, TOBRAPEAK, TOBRARND, AMIKACINPEAK, AMIKACINTROU, AMIKACIN in the last 72 hours.   Microbiology: Recent Results (from the past 720 hour(s))  Body fluid culture     Status: None   Collection Time: 10/10/14  8:09 AM  Result Value Ref Range Status   Specimen Description ASCITIES  Final   Special Requests NONE  Final   Gram Stain FEW WBC SEEN NO ORGANISMS SEEN   Final   Culture NO GROWTH 4 DAYS  Final   Report Status 10/14/2014 FINAL  Final  Culture, blood (routine x 2)     Status: None   Collection Time: 10/10/14  8:40 AM  Result Value Ref Range Status   Specimen Description BLOOD RIGHT HAND  Final   Special Requests BOTTLES DRAWN AEROBIC AND ANAEROBIC  1CC  Final   Culture NO GROWTH 6 DAYS  Final   Report  Status 10/16/2014 FINAL  Final  Culture, blood (routine x 2)     Status: None   Collection Time: 10/10/14  8:46 AM  Result Value Ref Range Status   Specimen Description BLOOD LEFT AC  Final   Special Requests BOTTLES DRAWN AEROBIC AND ANAEROBIC  2CC  Final   Culture NO GROWTH 6 DAYS  Final   Report Status 10/16/2014 FINAL  Final  Urine culture     Status: None   Collection Time: 10/10/14  6:52 PM  Result Value Ref Range Status   Specimen Description URINE, CATHETERIZED  Final   Special Requests NONE  Final   Culture NO GROWTH 1 DAY  Final   Report Status 10/11/2014 FINAL  Final  Culture, blood (routine x 2)     Status: None   Collection Time: 10/10/14  7:23 PM  Result Value Ref Range Status   Specimen Description BLOOD RIGHT HAND  Final   Special Requests BOTTLES DRAWN AEROBIC AND ANAEROBIC 5CC  Final   Culture NO GROWTH 5 DAYS  Final   Report Status 10/15/2014 FINAL  Final  Culture, blood (routine x 2)     Status: None   Collection Time: 10/10/14  7:30 PM  Result Value Ref Range Status   Specimen Description BLOOD LEFT HAND  Final   Special Requests IN PEDIATRIC BOTTLE 2CC  Final   Culture NO GROWTH 5 DAYS  Final   Report Status 10/15/2014 FINAL  Final  MRSA PCR Screening     Status: Abnormal   Collection Time: 10/10/14  7:42 PM  Result Value Ref Range Status   MRSA by PCR POSITIVE (A) NEGATIVE Final    Comment:        The GeneXpert MRSA Assay (FDA approved for NASAL specimens only), is one component of a comprehensive MRSA colonization surveillance program. It is not intended to diagnose MRSA infection nor to guide or monitor treatment for MRSA infections. RESULT CALLED TO, READ BACK BY AND VERIFIED WITH: FLYNT,F RN 2158 10/10/14 MITCHELL,L   Gram stain     Status: None   Collection Time: 10/25/2014  5:35 PM  Result Value Ref Range Status   Specimen Description URINE, CATHETERIZED  Final   Special Requests NONE  Final   Gram Stain   Final    WBC PRESENT,BOTH PMN AND  MONONUCLEAR NO ORGANISMS SEEN CYTOSPIN    Report Status 10/31/2014 FINAL  Final  Blood culture (routine x 2)     Status: None (Preliminary result)   Collection Time: 11/12/2014  6:16 PM  Result Value Ref Range Status   Specimen Description BLOOD RIGHT HAND  Final   Special Requests IN PEDIATRIC BOTTLE 3CC  Final   Culture PENDING  Incomplete   Report Status PENDING  Incomplete    Medical History: Past Medical History  Diagnosis Date  . Cirrhosis of liver ([place])   . Hemorrhoid     Assessment: 59 yo F presents on 10/14 for wound check s/p abdominal surgery on 9/26. Pharmacy consulted to dose abx for code sepsis. Currently afebrile, WBC slightly elevated at 11.3. No history of CKD. SCr elevated at 5.16, CrCl ~14ml/min.  Goal of Therapy:  Vancomycin trough level 15-20 mcg/ml  Resolution of infection  Plan:  Start ceftazidime 2g IV Q48 Give vancomycin 1.25g IV x 1, then start vancomycin 1g IV Q48 Monitor clinical picture, renal function, VT at Css F/U SCr trend, C&S, abx deescalation / LOT   Benigna Delisi J 11/02/2014,9:26 PM

## 2014-10-29 NOTE — Progress Notes (Signed)
Subjective: Noted leakage from incision, not eating much, having some bms, not talking much  Objective: Vital signs in last 24 hours: Temp:  [95 F (35 C)-97.6 F (36.4 C)] 97.4 F (36.3 C) (10/14 1828) Pulse Rate:  [92-100] 99 (10/14 2145) Resp:  [14-19] 16 (10/14 2145) BP: (83-95)/(31-51) 92/40 mmHg (10/14 2145) SpO2:  [99 %-100 %] 99 % (10/14 2145)    Intake/Output from previous day:   Intake/Output this shift:    Abd: distended with ascites, wound is open and dehisced, some minor serous leakage visualized  Lab Results:   Recent Labs  10/21/2014 1442  WBC 11.3*  HGB 6.5*  HCT 20.4*  PLT 168   BMET  Recent Labs  10/19/2014 1442  NA 133*  K 5.2*  CL 102  CO2 20*  GLUCOSE 105*  BUN 115*  CREATININE 5.16*  CALCIUM 7.8*   PT/INR  Recent Labs  11/14/2014 1607  LABPROT 20.6*  INR 1.77*    Studies/Results: Ct Abdomen Pelvis Wo Contrast  11/14/2014  CLINICAL DATA:  59 year old female with recent surgery for perforated ulcer. Abdominal distention. Draining wound. Hypotension. EXAM: CT ABDOMEN AND PELVIS WITHOUT CONTRAST TECHNIQUE: Multidetector CT imaging of the abdomen and pelvis was performed following the standard protocol without IV contrast. COMPARISON:  CT the abdomen and pelvis 10/10/2014. FINDINGS: Lower chest: Moderate right and small left pleural effusions lying dependently. Atherosclerotic calcifications in the left anterior descending, left circumflex and right coronary arteries. Hepatobiliary: The liver has a shrunken appearance and nodular contour, compatible with underlying cirrhosis. No discrete hepatic lesion confidently identified on today's noncontrast CT examination. The unenhanced appearance of the gallbladder is unremarkable. Pancreas: No definite pancreatic mass identified on today's noncontrast CT examination. Spleen: Spleen is enlarged measuring 13.8 x 7.2 x 15.6 (estimated splenic volume of 775 mL). Adrenals/Urinary Tract: Several  nonobstructive calculi are present within the right renal collecting system, the largest of which measures 6 mm in the upper pole. Some no additional calculi are identified in the left renal collecting system, along the course of either ureter or within the lumen of the urinary bladder. No hydroureteronephrosis to suggest urinary tract obstruction at this time. Urinary bladder is normal in appearance. Stomach/Bowel: There is profound thickening of the gastric wall in the antral pre-pyloric region of the stomach, best appreciated on image 41 of series 5. No pathologic dilatation of small bowel or colon. Vascular/Lymphatic: Atherosclerosis throughout the abdominal and pelvic vasculature, without evidence of aneurysm. No definite lymphadenopathy noted in the abdomen or pelvis on today's noncontrast CT examination. Reproductive: Coarsely calcified lesion measuring 4.1 cm in the fundus of the uterus, presumably a calcified fibroid. Ovaries are poorly demonstrated on today's noncontrast CT examination. Other: Large volume of ascites. Extensive nodular thickening of the omentum, which could simply be related to portosystemic collateral vessels, but is suspicious for potential peritoneal spread of malignancy. Some areas of peritoneal thickening and mild nodularity are also noted, but poorly depicted on today's noncontrast CT (example in the right-sided the abdomen on image 51 of series 5). No pneumoperitoneum. Small umbilical hernia containing some ascites. Musculoskeletal: There are no aggressive appearing lytic or blastic lesions noted in the visualized portions of the skeleton. IMPRESSION: 1. Profound soft tissue thickening in the region of the gastric antrum, presumably related to residual inflammation at site of prior perforated gastric ulcer. No evidence of residual extravasation of oral contrast material on today's examination. 2. Very nodular appearance of the omentum, concerning for intraperitoneal spread of  malignancy. Alternatively, this  may simply reflect multiple collateral pathways (portosystemic collateral pathways) in this patient with history of cirrhosis, but correlation with cytology from nonemergent paracentesis is suggested in the near future to exclude the presence of malignant intraperitoneal cells. 3. Stigmata of advanced cirrhosis with splenomegaly, suggesting portal hypertension. 4. Moderate right and small left pleural effusions lying dependently. 5. Extensive atherosclerosis, including at least 3 vessel coronary artery disease. 6. Additional incidental findings, as above. Electronically Signed   By: Vinnie Langton M.D.   On: 10/19/2014 19:55   Dg Abd Acute W/chest  11/06/2014  CLINICAL DATA:  History abdominal surgery last week. Abdominal pain and distention. Weakness in the lower body. EXAM: DG ABDOMEN ACUTE W/ 1V CHEST COMPARISON:  CT abdomen and pelvis 10/10/2014. FINDINGS: The heart size is normal. Chronic interstitial coarsening is present. Recur and left basilar atelectasis is present. A relative paucity bowel gas is present. There is no evidence for obstruction or free air. Degenerative changes are again noted in the lower lumbar spine. A calcified uterine fibroid is again noted. IMPRESSION: 1. Bibasilar airspace opacity likely reflects atelectasis. 2. Relative paucity of bowel gas without evidence for obstruction or free air. 3. Calcified uterine fibroid. Electronically Signed   By: San Morelle M.D.   On: 10/19/2014 15:44    Anti-infectives: Anti-infectives    Start     Dose/Rate Route Frequency Ordered Stop   10/31/14 2200  vancomycin (VANCOCIN) IVPB 1000 mg/200 mL premix     1,000 mg 200 mL/hr over 60 Minutes Intravenous Every 48 hours 10/25/2014 2131     11/08/2014 2145  vancomycin (VANCOCIN) 1,250 mg in sodium chloride 0.9 % 250 mL IVPB     1,250 mg 166.7 mL/hr over 90 Minutes Intravenous  Once 11/02/2014 2126     10/21/2014 2130  cefTAZidime (FORTAZ) 2 g in dextrose 5 %  50 mL IVPB     2 g 100 mL/hr over 30 Minutes Intravenous Every 48 hours 11/05/2014 2125        Assessment/Plan: S/p graham patch perf du  Her ct has significant ascites and at some point would be reasonable to tap this.  I dont think she has intraabdominal infection and the nodules on omentum are unlikely cancer as she was just operated on this is likely postop.  She can resume a diet and can have bid dressing changes for now.  We will follow  [facility] 11/03/2014

## 2014-10-29 NOTE — ED Notes (Signed)
Patient has returned from being out of the department; patient placed back on monitor, continuous pulse oximetry and blood pressure cuff 

## 2014-10-29 NOTE — ED Notes (Signed)
Per ems- Pt comes from home where she had abdominal surgery on 10/11/14 for a bleeding ulcer. Has vertical abd incision to upper middle abdomen that was left open. Home health came out today and reports increased drainage from wound, having to use about 12 abdominal pads with absorb yellow/clear drainage. Initially her oxygen was 60% on RA but RN was able to increase without using oxygen to 92%. When ems arrived reported oxygen 80's with 2L came up to 99%. ETCo2 14, 99 HR, 107/48, CBG 134, 99% 2L. Pt is alert x 4. Significant ascites to abdomen .

## 2014-10-30 ENCOUNTER — Inpatient Hospital Stay (HOSPITAL_COMMUNITY): Payer: Medicaid Other

## 2014-10-30 DIAGNOSIS — L8990 Pressure ulcer of unspecified site, unspecified stage: Secondary | ICD-10-CM | POA: Diagnosis present

## 2014-10-30 DIAGNOSIS — K767 Hepatorenal syndrome: Principal | ICD-10-CM

## 2014-10-30 DIAGNOSIS — R188 Other ascites: Secondary | ICD-10-CM | POA: Insufficient documentation

## 2014-10-30 LAB — BODY FLUID CELL COUNT WITH DIFFERENTIAL
Lymphs, Fluid: 51 %
Monocyte-Macrophage-Serous Fluid: 17 % — ABNORMAL LOW (ref 50–90)
Neutrophil Count, Fluid: 32 % — ABNORMAL HIGH (ref 0–25)
Total Nucleated Cell Count, Fluid: 117 cu mm (ref 0–1000)

## 2014-10-30 LAB — COMPREHENSIVE METABOLIC PANEL
ALBUMIN: 1.9 g/dL — AB (ref 3.5–5.0)
ALK PHOS: 270 U/L — AB (ref 38–126)
ALT: 19 U/L (ref 14–54)
ANION GAP: 14 (ref 5–15)
AST: 56 U/L — ABNORMAL HIGH (ref 15–41)
BILIRUBIN TOTAL: 2.3 mg/dL — AB (ref 0.3–1.2)
BUN: 111 mg/dL — ABNORMAL HIGH (ref 6–20)
CALCIUM: 7.6 mg/dL — AB (ref 8.9–10.3)
CO2: 17 mmol/L — ABNORMAL LOW (ref 22–32)
CREATININE: 4.92 mg/dL — AB (ref 0.44–1.00)
Chloride: 98 mmol/L — ABNORMAL LOW (ref 101–111)
GFR calc Af Amer: 10 mL/min — ABNORMAL LOW (ref >60)
GFR calc non Af Amer: 9 mL/min — ABNORMAL LOW (ref >60)
GLUCOSE: 74 mg/dL (ref 65–99)
Potassium: 5.4 mmol/L — ABNORMAL HIGH (ref 3.5–5.1)
Sodium: 129 mmol/L — ABNORMAL LOW (ref 135–145)
TOTAL PROTEIN: 6.1 g/dL — AB (ref 6.5–8.1)

## 2014-10-30 LAB — CBC
HCT: 23.4 % — ABNORMAL LOW (ref 36.0–46.0)
HCT: 26.7 % — ABNORMAL LOW (ref 36.0–46.0)
HEMOGLOBIN: 7.6 g/dL — AB (ref 12.0–15.0)
Hemoglobin: 8.8 g/dL — ABNORMAL LOW (ref 12.0–15.0)
MCH: 26.1 pg (ref 26.0–34.0)
MCH: 26.3 pg (ref 26.0–34.0)
MCHC: 32.5 g/dL (ref 30.0–36.0)
MCHC: 33 g/dL (ref 30.0–36.0)
MCV: 80.4 fL (ref 78.0–100.0)
MCV: 79.9 fL (ref 78.0–100.0)
PLATELETS: 147 10*3/uL — AB (ref 150–400)
PLATELETS: 115 10*3/uL — AB (ref 150–400)
RBC: 2.91 MIL/uL — ABNORMAL LOW (ref 3.87–5.11)
RBC: 3.34 MIL/uL — AB (ref 3.87–5.11)
RDW: 23.6 % — AB (ref 11.5–15.5)
RDW: 22.2 % — ABNORMAL HIGH (ref 11.5–15.5)
WBC: 7.3 10*3/uL (ref 4.0–10.5)
WBC: 9.4 10*3/uL (ref 4.0–10.5)

## 2014-10-30 LAB — GRAM STAIN

## 2014-10-30 LAB — LACTIC ACID, PLASMA
Lactic Acid, Venous: 1 mmol/L (ref 0.5–2.0)
Lactic Acid, Venous: 1.3 mmol/L (ref 0.5–2.0)

## 2014-10-30 LAB — LACTATE DEHYDROGENASE, PLEURAL OR PERITONEAL FLUID: LD FL: 74 U/L — AB (ref 3–23)

## 2014-10-30 LAB — PROTIME-INR
INR: 1.64 — AB (ref 0.00–1.49)
PROTHROMBIN TIME: 19.4 s — AB (ref 11.6–15.2)

## 2014-10-30 LAB — PREPARE RBC (CROSSMATCH)

## 2014-10-30 LAB — PROTEIN, BODY FLUID: Total protein, fluid: <3 g/dL

## 2014-10-30 MED ORDER — SODIUM CHLORIDE 0.9 % IV SOLN
INTRAVENOUS | Status: DC
  Administered 2014-10-30: 20:00:00 via INTRAVENOUS

## 2014-10-30 MED ORDER — PROMETHAZINE HCL 25 MG/ML IJ SOLN: 6.2500 mg | INTRAMUSCULAR | Status: DC | PRN

## 2014-10-30 MED ORDER — INFLUENZA VAC SPLIT QUAD 0.5 ML IM SUSY
0.5000 mL | PREFILLED_SYRINGE | INTRAMUSCULAR | Status: AC
  Administered 2014-10-31: 0.5 mL via INTRAMUSCULAR
  Filled 2014-10-30: qty 0.5

## 2014-10-30 MED ORDER — BISACODYL 10 MG RE SUPP: 10.0000 mg | Freq: Two times a day (BID) | RECTAL | Status: DC | PRN

## 2014-10-30 MED ORDER — SACCHAROMYCES BOULARDII 250 MG PO CAPS
250.0000 mg | ORAL_CAPSULE | Freq: Two times a day (BID) | ORAL | Status: DC
  Administered 2014-10-30 – 2014-10-31 (×4): 250 mg via ORAL
  Filled 2014-10-30 (×6): qty 1

## 2014-10-30 MED ORDER — SODIUM CHLORIDE 0.9 % IV SOLN
8.0000 mg | Freq: Four times a day (QID) | INTRAVENOUS | Status: DC | PRN
  Filled 2014-10-30: qty 4

## 2014-10-30 MED ORDER — CHLORHEXIDINE GLUCONATE CLOTH 2 % EX PADS: 6.0000 | MEDICATED_PAD | Freq: Every day | CUTANEOUS | Status: DC

## 2014-10-30 MED ORDER — ONDANSETRON HCL 4 MG/2ML IJ SOLN: 4.0000 mg | Freq: Four times a day (QID) | INTRAMUSCULAR | Status: DC | PRN

## 2014-10-30 MED ORDER — DIPHENHYDRAMINE HCL 50 MG/ML IJ SOLN
12.5000 mg | Freq: Four times a day (QID) | INTRAMUSCULAR | Status: DC | PRN
  Administered 2014-10-31: 25 mg via INTRAVENOUS
  Filled 2014-10-30: qty 1

## 2014-10-30 MED ORDER — LIDOCAINE HCL (PF) 1 % IJ SOLN
INTRAMUSCULAR | Status: AC
  Filled 2014-10-30: qty 10

## 2014-10-30 MED ORDER — SODIUM CHLORIDE 0.9 % IV SOLN: Freq: Once | INTRAVENOUS | Status: DC

## 2014-10-30 MED ORDER — MUPIROCIN 2 % EX OINT
1.0000 "application " | TOPICAL_OINTMENT | Freq: Two times a day (BID) | CUTANEOUS | Status: DC
  Administered 2014-10-30 – 2014-10-31 (×3): 1 via NASAL
  Filled 2014-10-30: qty 22

## 2014-10-30 MED ORDER — WHITE PETROLATUM GEL
Status: AC
  Administered 2014-10-30: 0.2
  Filled 2014-10-30: qty 1

## 2014-10-30 NOTE — Progress Notes (Signed)
Subjective: Readmitted for failure to thrive with abd distention & ascites ICU RN in room GI Dr Amedeo Plenty just seen Less leakage from incision Pt tired Not eating much  Objective: Vital signs in last 24 hours: Temp:  [95 F (35 C)-98.2 F (36.8 C)] 98.1 F (36.7 C) (10/15 0829) Pulse Rate:  [92-107] 107 (10/15 0544) Resp:  [14-55] 28 (10/15 0544) BP: (83-110)/(31-51) 110/51 mmHg (10/15 0544) SpO2:  [97 %-100 %] 97 % (10/15 0544) Weight:  [58.6 kg (129 lb 3 oz)] 58.6 kg (129 lb 3 oz) (10/14 2245)    Intake/Output from previous day: 10/14 0701 - 10/15 0700 In: 585 [Blood:335; IV Piggyback:250] Out: 250 [Urine:250]   Intake/Output this shift:   General: Pt sleepy, oriented x2 mild acute distress.  cachectic Eyes: PERRL, normal EOM. Sclera icteric Neuro: CN II-XII intact w/o focal sensory/motor deficits. Lymph: No head/neck/groin lymphadenopathy Psych:  No delerium/psychosis/paranoia HENT: Normocephalic, Mucus membranes moist.  No thrush Neck: Supple, No tracheal deviation Chest: No pain.  Good respiratory excursion. CV:  Pulses intact.   Regular rhythm MS: Normal AROM mjr joints.  No obvious deformity Abd: Soft but very distended with ascites, wound is open w exposed suture & mild necrosis inferiorly.  No active leaking at this time.  H/o some serous (ascites) drainage.  Umb hernia soft & reducible.   Ext:  SCDs BLE.  No significant edema.  No cyanosis Skin: No petechiae / purpura.  Mild jaundice    Lab Results:   Recent Labs  10/28/2014 1442 10/30/14 0219  WBC 11.3* 7.3  HGB 6.5* 7.6*  HCT 20.4* 23.4*  PLT 168 147*   BMET  Recent Labs  11/07/2014 1442 10/30/14 0219  NA 133* 129*  K 5.2* 5.4*  CL 102 98*  CO2 20* 17*  GLUCOSE 105* 74  BUN 115* 111*  CREATININE 5.16* 4.92*  CALCIUM 7.8* 7.6*   PT/INR  Recent Labs  11/07/2014 1607 10/30/14 0219  LABPROT 20.6* 19.4*  INR 1.77* 1.64*    Studies/Results: Ct Abdomen Pelvis Wo Contrast  10/27/2014   CLINICAL DATA:  59 year old female with recent surgery for perforated ulcer. Abdominal distention. Draining wound. Hypotension. EXAM: CT ABDOMEN AND PELVIS WITHOUT CONTRAST TECHNIQUE: Multidetector CT imaging of the abdomen and pelvis was performed following the standard protocol without IV contrast. COMPARISON:  CT the abdomen and pelvis 10/10/2014. FINDINGS: Lower chest: Moderate right and small left pleural effusions lying dependently. Atherosclerotic calcifications in the left anterior descending, left circumflex and right coronary arteries. Hepatobiliary: The liver has a shrunken appearance and nodular contour, compatible with underlying cirrhosis. No discrete hepatic lesion confidently identified on today's noncontrast CT examination. The unenhanced appearance of the gallbladder is unremarkable. Pancreas: No definite pancreatic mass identified on today's noncontrast CT examination. Spleen: Spleen is enlarged measuring 13.8 x 7.2 x 15.6 (estimated splenic volume of 775 mL). Adrenals/Urinary Tract: Several nonobstructive calculi are present within the right renal collecting system, the largest of which measures 6 mm in the upper pole. Some no additional calculi are identified in the left renal collecting system, along the course of either ureter or within the lumen of the urinary bladder. No hydroureteronephrosis to suggest urinary tract obstruction at this time. Urinary bladder is normal in appearance. Stomach/Bowel: There is profound thickening of the gastric wall in the antral pre-pyloric region of the stomach, best appreciated on image 41 of series 5. No pathologic dilatation of small bowel or colon. Vascular/Lymphatic: Atherosclerosis throughout the abdominal and pelvic vasculature, without evidence of aneurysm. No  definite lymphadenopathy noted in the abdomen or pelvis on today's noncontrast CT examination. Reproductive: Coarsely calcified lesion measuring 4.1 cm in the fundus of the uterus, presumably a  calcified fibroid. Ovaries are poorly demonstrated on today's noncontrast CT examination. Other: Large volume of ascites. Extensive nodular thickening of the omentum, which could simply be related to portosystemic collateral vessels, but is suspicious for potential peritoneal spread of malignancy. Some areas of peritoneal thickening and mild nodularity are also noted, but poorly depicted on today's noncontrast CT (example in the right-sided the abdomen on image 51 of series 5). No pneumoperitoneum. Small umbilical hernia containing some ascites. Musculoskeletal: There are no aggressive appearing lytic or blastic lesions noted in the visualized portions of the skeleton. IMPRESSION: 1. Profound soft tissue thickening in the region of the gastric antrum, presumably related to residual inflammation at site of prior perforated gastric ulcer. No evidence of residual extravasation of oral contrast material on today's examination. 2. Very nodular appearance of the omentum, concerning for intraperitoneal spread of malignancy. Alternatively, this may simply reflect multiple collateral pathways (portosystemic collateral pathways) in this patient with history of cirrhosis, but correlation with cytology from nonemergent paracentesis is suggested in the near future to exclude the presence of malignant intraperitoneal cells. 3. Stigmata of advanced cirrhosis with splenomegaly, suggesting portal hypertension. 4. Moderate right and small left pleural effusions lying dependently. 5. Extensive atherosclerosis, including at least 3 vessel coronary artery disease. 6. Additional incidental findings, as above. Electronically Signed   By: Vinnie Langton M.D.   On: 10/22/2014 19:55   Dg Abd Acute W/chest  11/06/2014  CLINICAL DATA:  History abdominal surgery last week. Abdominal pain and distention. Weakness in the lower body. EXAM: DG ABDOMEN ACUTE W/ 1V CHEST COMPARISON:  CT abdomen and pelvis 10/10/2014. FINDINGS: The heart size is  normal. Chronic interstitial coarsening is present. Recur and left basilar atelectasis is present. A relative paucity bowel gas is present. There is no evidence for obstruction or free air. Degenerative changes are again noted in the lower lumbar spine. A calcified uterine fibroid is again noted. IMPRESSION: 1. Bibasilar airspace opacity likely reflects atelectasis. 2. Relative paucity of bowel gas without evidence for obstruction or free air. 3. Calcified uterine fibroid. Electronically Signed   By: San Morelle M.D.   On: 11/11/2014 15:44    Anti-infectives: Anti-infectives    Start     Dose/Rate Route Frequency Ordered Stop   10/31/14 2200  vancomycin (VANCOCIN) IVPB 1000 mg/200 mL premix     1,000 mg 200 mL/hr over 60 Minutes Intravenous Every 48 hours 11/11/2014 2131     11/11/2014 2145  vancomycin (VANCOCIN) 1,250 mg in sodium chloride 0.9 % 250 mL IVPB     1,250 mg 166.7 mL/hr over 90 Minutes Intravenous  Once 11/08/2014 2126 10/27/2014 2342   11/11/2014 2130  cefTAZidime (FORTAZ) 2 g in dextrose 5 % 50 mL IVPB     2 g 100 mL/hr over 30 Minutes Intravenous Every 48 hours 10/27/2014 2125       OR 10/10/2014  PRE-OPERATIVE DIAGNOSIS: perforated gastric ulcer, cirrhosis with ascites  POST-OPERATIVE DIAGNOSIS: Same  PROCEDURE: Procedure(s): Exploratory laparotomy, graham patch repair of pre pyloric gastric ulcer, primary umbilical repair (not part of the surgical incision)  SURGEON: Surgeon(s): Stark Klein, MD   Assessment/Plan: Failure to thrive  Recurrent massive ascites - paracentesis today to keep pressure off the incision leaking - ?consider drain x 2 weeks to paracentesis not working.  Doubt intraabdominal infection  ?Nodules on omentum  are unlikely cancer as she was just operated on this is likely postop.  No evidence of carcinomatosis on ex lap so radiology probably over calling CT scan.  Resume a diet as possible  Start wound vac to help incision close faster &  minimize/control any leaking.  Prob will need wound debridement to remove suture & SQ ncrosis but hold off for now to avoid full fascial dehiscence.    We will follow up on Monday  Pricilla Moehle C. 10/30/2014

## 2014-10-30 NOTE — H&P (Signed)
History and Physical  [name]  VOH:[id number]  DOB: 12-11-1955  DOA: 11/11/2014  Referring physician: Ivin Booty, MD PCP: Donnie Coffin, MD   Chief Complaint: Wound drainage and AKI  HPI: [name] is a 59 y.o. female with a past medical history significant for alcoholic cirrhosis and recently repaired perforated gastric ulcer who presents with leaking from her wound and AKI.  The patient was diagnosed with alcoholic cirrhosis within the last year and has started to undergo large volume paracentesis this summer. 3 weeks ago she was admitted for abdominal pain and underwent exploratory laparotomy and then [facility] patch of a ruptured ulcer. She has been at home rehabilitating since the surgery and receiving home health for daily wound packing (without a wound VAC). Today her home health nurses noted markedly increase in drainage from her wound and recommended she come to the ED.   The patient notes abdominal pain, back pain, decreased appetite, increased swelling of the abdomen. Her appetite has been decreased over the course of more than a week and she has had very little by mouth intake.  In the ED, the patient was anemic to 6.5 g/dL with blood pressure in the 80s over 40s. She received 1 unit of blood and her hemoglobin increased to 9.  She was hypothermic with a rectal temperature of 95, and albumin 1.7, and a creatinine of 5 from a baseline around 1.  A CT showed large volume ascites.   Review of Systems:  Patient seen 6:16 AM on 10/30/2014. Pt complains of weakness, decreased appetite, abdominal pain, decreased urine, back pain. All other systems negative except as just noted or noted in the history of present illness.  Past Medical History  Diagnosis Date  . Cirrhosis of liver ([place])   . Hemorrhoid   The above past medical history was reviewed.  Past Surgical History  Procedure Laterality Date  . Tonsillectomy    . Appendectomy    . Colonoscopy  2006    Normal    . Excisional hemorrhoidectomy    . Laparotomy N/A 10/10/2014    Procedure: EXPLORATORY LAPAROTOMY, OMENTAL GRAHAM PATCHING OF PRE-PYLORIC GASTRIC ULCER, AND PRIMARY UMBILICAL HERNIA REPAIR;  Surgeon: Stark Klein, MD;  Location: [place];  Service: General;  Laterality: N/A;  The above surgical history was reviewed.  Social History: Patient lives at home. She has a daughter who lives in the area, and a son who lives in [place]. She is a smoker.    Allergies  Allergen Reactions  . Penicillins Hives    Has patient had a PCN reaction causing immediate rash, facial/tongue/throat swelling, SOB or lightheadedness with hypotension: Yes Has patient had a PCN reaction causing severe rash involving mucus membranes or skin necrosis: No Has patient had a PCN reaction that required hospitalization No Has patient had a PCN reaction occurring within the last 10 years: No If all of the above answers are "NO", then may proceed with Cephalosporin use.    Family History  Problem Relation Age of Onset  . Hypertension Mother   . Dementia Father   . Colon cancer Neg Hx   . Alcohol abuse Brother   . Alcohol abuse Brother   . Alcohol abuse Brother     Prior to Admission medications   Medication Sig Start Date End Date Taking? Authorizing Provider  feeding supplement, ENSURE COMPLETE, (ENSURE COMPLETE) LIQD Take 237 mLs by mouth 2 (two) times daily between meals. 10/16/14  Yes Albertine Patricia, MD  furosemide (  LASIX) 20 MG tablet Take 1 tablet (20 mg total) by mouth 2 (two) times daily. 05/25/14  Yes Dustin Flock, MD  OxyCODONE HCl (OXYCONTIN PO) Take 10 mg by mouth every 4 (four) hours as needed. For pain   Yes Historical Provider, MD  polyethylene glycol (MIRALAX / GLYCOLAX) packet Take 17 g by mouth daily as needed for mild constipation or moderate constipation. 10/16/14  Yes Albertine Patricia, MD  spironolactone (ALDACTONE) 50 MG tablet Take 50 mg by mouth once.   Yes Historical Provider, MD   pantoprazole (PROTONIX) 40 MG tablet Take 1 tablet (40 mg total) by mouth 2 (two) times daily. Patient not taking: Reported on 10/28/2014 10/16/14   Albertine Patricia, MD    Physical Exam: BP 110/51 mmHg  Pulse 107  Temp(Src) 97.6 F (36.4 C) (Oral)  Resp 28  Ht 5\' 4"  (1.626 m)  Wt 58.6 kg (129 lb 3 oz)  BMI 22.16 kg/m2  SpO2 97% General appearance: Cachectic adult female, lying in bed with eyes closed and in moderate distress from pain.  Responds appropriately to questions.   Eyes: Scleral icterus, no discharge   Nose: No deformity, discharge, or epistaxis.   Mouth: Edentulous. OP dry.   Lymph: No cervical, supraclavicular or axillary lymphadenopathy. Skin: Warm and dry.   No suspicious rashes or lesions. Cardiac: Tachycardic and regular, nl S1-S2, no murmurs appreciated.   Respiratory: Normal respiratory rate and rhythm.  CTAB without rales or wheezes. Abdomen: BS present.  abdomen round and thought. There is a bandage over her upper abdomen laparotomy scar. There is diffuse tenderness to palpation.   Neuro: Sensorium intact but eyes closed and very still because of pain and discomfort and weakness.  Speech is fluent.  Attention and concentration are normal.  Memory seems intact.  Moves all extremities equally and with normal coordination.    Psych: Constrained affect.  Speech normal. Thought content/process linear/appropriate.  No evidence of aural or visual hallucinations or delusions.       Labs on Admission:  The metabolic panel is notable for elevated serum creatinine, hyponatremia. The lactic acid level is normal.   The urine sodium is <10 The complete blood count is notable for leukocytosis, anemia.   Radiological Exams on Admission: Ct Abdomen Pelvis Wo Contrast  10/28/2014  CLINICAL DATA:  59 year old female with recent surgery for perforated ulcer. Abdominal distention. Draining wound. Hypotension. EXAM: CT ABDOMEN AND PELVIS WITHOUT CONTRAST TECHNIQUE:  Multidetector CT imaging of the abdomen and pelvis was performed following the standard protocol without IV contrast. COMPARISON:  CT the abdomen and pelvis 10/10/2014. FINDINGS: Lower chest: Moderate right and small left pleural effusions lying dependently. Atherosclerotic calcifications in the left anterior descending, left circumflex and right coronary arteries. Hepatobiliary: The liver has a shrunken appearance and nodular contour, compatible with underlying cirrhosis. No discrete hepatic lesion confidently identified on today's noncontrast CT examination. The unenhanced appearance of the gallbladder is unremarkable. Pancreas: No definite pancreatic mass identified on today's noncontrast CT examination. Spleen: Spleen is enlarged measuring 13.8 x 7.2 x 15.6 (estimated splenic volume of 775 mL). Adrenals/Urinary Tract: Several nonobstructive calculi are present within the right renal collecting system, the largest of which measures 6 mm in the upper pole. Some no additional calculi are identified in the left renal collecting system, along the course of either ureter or within the lumen of the urinary bladder. No hydroureteronephrosis to suggest urinary tract obstruction at this time. Urinary bladder is normal in appearance. Stomach/Bowel: There is profound  thickening of the gastric wall in the antral pre-pyloric region of the stomach, best appreciated on image 41 of series 5. No pathologic dilatation of small bowel or colon. Vascular/Lymphatic: Atherosclerosis throughout the abdominal and pelvic vasculature, without evidence of aneurysm. No definite lymphadenopathy noted in the abdomen or pelvis on today's noncontrast CT examination. Reproductive: Coarsely calcified lesion measuring 4.1 cm in the fundus of the uterus, presumably a calcified fibroid. Ovaries are poorly demonstrated on today's noncontrast CT examination. Other: Large volume of ascites. Extensive nodular thickening of the omentum, which could simply  be related to portosystemic collateral vessels, but is suspicious for potential peritoneal spread of malignancy. Some areas of peritoneal thickening and mild nodularity are also noted, but poorly depicted on today's noncontrast CT (example in the right-sided the abdomen on image 51 of series 5). No pneumoperitoneum. Small umbilical hernia containing some ascites. Musculoskeletal: There are no aggressive appearing lytic or blastic lesions noted in the visualized portions of the skeleton. IMPRESSION: 1. Profound soft tissue thickening in the region of the gastric antrum, presumably related to residual inflammation at site of prior perforated gastric ulcer. No evidence of residual extravasation of oral contrast material on today's examination. 2. Very nodular appearance of the omentum, concerning for intraperitoneal spread of malignancy. Alternatively, this may simply reflect multiple collateral pathways (portosystemic collateral pathways) in this patient with history of cirrhosis, but correlation with cytology from nonemergent paracentesis is suggested in the near future to exclude the presence of malignant intraperitoneal cells. 3. Stigmata of advanced cirrhosis with splenomegaly, suggesting portal hypertension. 4. Moderate right and small left pleural effusions lying dependently. 5. Extensive atherosclerosis, including at least 3 vessel coronary artery disease. 6. Additional incidental findings, as above. Electronically Signed   By: Vinnie Langton M.D.   On: 11/12/2014 19:55   Dg Abd Acute W/chest  10/31/2014  CLINICAL DATA:  History abdominal surgery last week. Abdominal pain and distention. Weakness in the lower body. EXAM: DG ABDOMEN ACUTE W/ 1V CHEST COMPARISON:  CT abdomen and pelvis 10/10/2014. FINDINGS: The heart size is normal. Chronic interstitial coarsening is present. Recur and left basilar atelectasis is present. A relative paucity bowel gas is present. There is no evidence for obstruction or free  air. Degenerative changes are again noted in the lower lumbar spine. A calcified uterine fibroid is again noted. IMPRESSION: 1. Bibasilar airspace opacity likely reflects atelectasis. 2. Relative paucity of bowel gas without evidence for obstruction or free air. 3. Calcified uterine fibroid. Electronically Signed   By: San Morelle M.D.   On: 11/12/2014 15:44      Assessment/Plan Principal Problem:   Hepatorenal syndrome (HCC) Active Problems:   Alcoholic cirrhosis of liver with ascites (HCC)   Protein-calorie malnutrition (HCC)   AKI (acute kidney injury) ([place])   Abdominal distension   Pressure ulcer  1. Cirrhosis with ascites, suspect SBP:  The patient presents with signs of sepsis and worsening ascites.   - Will perform diagnostic and therapeutic paracentesis tomorrow, maximum fluid removal 3L.   - Vancomycin and ceftazidime - Consult to General Surgery, appreciate recommendations   2. Acute anemia:  This is new.  Concern for recurrent bleeding from gastric ulcer.  No known varices, but no history of EGD. - Trend CBC - Repeat transfusion for Hgb < 7 - Step down for close hemodynamic monitoring - Consult to GI, appreciate recommendations  3. Acute kidney injury:  ATN versus prerenal injury versus hepatorenal syndrome.   - Urine electrolytes - Trial albumin 25 g q6hrs  -  Consult to nephrology, appreciate recommendations  4. Hyponatremia:  Suspect related to sepsis versus hypovolemia versus hepatorenal syndrome.  5. Leukocytosis:  Stable.  Suspect this is related to #1 above.      DVT PPx: SCDs Consultants: Neph, Gen Surg, GI Code Status: Full    Disposition Plan:  Close hemodynamic monitoring and repeat chemistries and hemoglobins.  The patient's prognosis is guarded.   Edwin Dada [facility] Pager [phone]

## 2014-10-30 NOTE — Progress Notes (Signed)
[place] TEAM 1 - Stepdown/ICU TEAM PROGRESS NOTE  [name] ULA:[id number] DOB: 12/24/1955 DOA: 10/31/2014 PCP: Donnie Coffin, MD  Admit HPI / Brief Narrative: 59 y.o. female with a history of alcoholic cirrhosis and recently repaired perforated gastric ulcer who presented with leaking from her wound and AKI.  The patient was diagnosed with alcoholic cirrhosis within the last year and started recurrening large volume paracentesis this summer. 3 weeks ago she was admitted for abdominal pain and underwent exploratory laparotomy and then [facility] patch of a ruptured ulcer. She had been at home rehabilitating since the surgery and receiving home health for daily wound packing (without a wound VAC). The day of her admit her home health nurses noted marked increase in drainage from her wound and recommended she come to the ED.   In the ED, the patient was anemic to 6.5 g/dL with blood pressure in the 80s over 40s. She received 1 unit of blood and her hemoglobin increased to 9. She was hypothermic with a rectal temperature of 95, and albumin 1.7, and a creatinine of 5 from a baseline around 1. A CT showed large volume ascites.  HPI/Subjective: The patient is laying in bed staring off into space.  She denies any complaints this time and states that overall she feels better.  She is mildly confused and a comprehensive review of systems is not felt to be reliable.  There is no family present at time of visit.  Assessment/Plan:  Sepsis due to suspected SBP HR 107 + RR28 - continue empiric antibiotics - follow clinically - f/u ascites fluid studies   Alcoholic Cirrhosis with ascites -s/p paracentesis   Recent perforated gastric ulcer S/p repair w/ Phillip Heal patch - open wound healing by secondary intention - Gen Surgery following  Anemia of unclear cause  No clear evidence of acute blood loss - transfuse a second unit PRBC and follow trend   Acute kidney failure ATN versus prerenal injury  versus hepatorenal syndrome - appears to be improving - follow trend - attempt to improve BP / perfusion    Hyponatremia Suspect related to sepsis versus hypovolemia versus hepatorenal syndrome - follow w/ careful volume expansion   Code Status: FULL Family Communication: no family present at time of exam Disposition Plan: SDU  Consultants: GI Gen Surgery Nephrology    Procedures: 10/15 - paracentesis   Antibiotics: Ceftaz 10/14 > Vancomycin 10/14 >  DVT prophylaxis: SCDs  Objective: Blood pressure 89/44, pulse 102, temperature 97.9 F (36.6 C), temperature source Oral, resp. rate 23, height 5\' 4"  (1.626 m), weight 58.6 kg (129 lb 3 oz), SpO2 97 %.  Intake/Output Summary (Last 24 hours) at 10/30/14 1351 Last data filed at 10/30/14 1224  Gross per 24 hour  Intake    645 ml  Output    250 ml  Net    395 ml   Exam: General: No acute respiratory distress Lungs: Clear to auscultation bilaterally without wheezes or crackles Cardiovascular: Tachycardic at 110 bpm without appreciable murmur gallop or rub Abdomen: Obvious persistent ascites, distended but not tense, large midline wound dressed clean and dry, bowel sounds positive, no rebound Extremities: No significant cyanosis, clubbing, or edema bilateral lower extremities  Data Reviewed: Basic Metabolic Panel:  Recent Labs Lab 11/03/2014 1442 10/30/14 0219  NA 133* 129*  K 5.2* 5.4*  CL 102 98*  CO2 20* 17*  GLUCOSE 105* 74  BUN 115* 111*  CREATININE 5.16* 4.92*  CALCIUM 7.8* 7.6*    CBC:  Recent Labs Lab 11/08/2014 1442 10/30/14 0219  WBC 11.3* 7.3  NEUTROABS 9.2*  --   HGB 6.5* 7.6*  HCT 20.4* 23.4*  MCV 78.2 80.4  PLT 168 147*    Liver Function Tests:  Recent Labs Lab 10/19/2014 1442 10/30/14 0219  AST 61* 56*  ALT 20 19  ALKPHOS 258* 270*  BILITOT 2.3* 2.3*  PROT 6.0* 6.1*  ALBUMIN 1.7* 1.9*    Recent Labs Lab 11/08/2014 1542  LIPASE 30   No results for input(s): AMMONIA in the last  168 hours.  Coags:  Recent Labs Lab 10/19/2014 1607 10/30/14 0219  INR 1.77* 1.64*    Recent Results (from the past 240 hour(s))  Urine culture     Status: None (Preliminary result)   Collection Time: 11/11/2014  5:35 PM  Result Value Ref Range Status   Specimen Description URINE, CATHETERIZED  Final   Special Requests NONE  Final   Culture NO GROWTH < 24 HOURS  Final   Report Status PENDING  Incomplete  Gram stain     Status: None   Collection Time: 10/31/2014  5:35 PM  Result Value Ref Range Status   Specimen Description URINE, CATHETERIZED  Final   Special Requests NONE  Final   Gram Stain   Final    WBC PRESENT,BOTH PMN AND MONONUCLEAR NO ORGANISMS SEEN CYTOSPIN    Report Status 10/21/2014 FINAL  Final  Blood culture (routine x 2)     Status: None (Preliminary result)   Collection Time: 10/30/2014  6:16 PM  Result Value Ref Range Status   Specimen Description BLOOD RIGHT HAND  Final   Special Requests IN PEDIATRIC BOTTLE 3CC  Final   Culture PENDING  Incomplete   Report Status PENDING  Incomplete  Culture, body fluid-bottle     Status: None (Preliminary result)   Collection Time: 10/30/14 11:33 AM  Result Value Ref Range Status   Specimen Description FLUID ABDOMEN PERITONEAL  Final   Special Requests BAA 10CCS  Final   Culture PENDING  Incomplete   Report Status PENDING  Incomplete  Gram stain     Status: None   Collection Time: 10/30/14 11:33 AM  Result Value Ref Range Status   Specimen Description FLUID ABDOMEN PERITONEAL  Final   Special Requests NONE  Final   Gram Stain   Final    FEW WBC PRESENT,BOTH PMN AND MONONUCLEAR NO ORGANISMS SEEN    Report Status 10/30/2014 FINAL  Final     Studies:   Recent x-ray studies have been reviewed in detail by the Attending Physician  Scheduled Meds:  Scheduled Meds: . cefTAZidime (FORTAZ)  IV  2 g Intravenous Q48H  . Influenza vac split quadrivalent PF  0.5 mL Intramuscular Tomorrow-1000  . lidocaine (PF)      .  saccharomyces boulardii  250 mg Oral BID  . sodium chloride  3 mL Intravenous Q12H  . [START ON 10/31/2014] vancomycin  1,000 mg Intravenous Q48H    Time spent on care of this patient: 35 mins   MCCLUNG,JEFFREY T , MD   [facility] Office  [phone] Pager - Text Page per Shea Evans as per below:  On-Call/Text Page:      [email or web address]      password [email or web address]  If 7PM-7AM, please contact night-coverage [email or web address] Password [email or web address] 10/30/2014, 1:51 PM   LOS: 1 day

## 2014-10-30 NOTE — Consult Note (Signed)
[facility] Consult Note  Referring Provider: No ref. provider found Primary Care Physician:  Donnie Coffin, MD Primary Gastroenterologist:  Dr.  Laurel Dimmer Complaint: Leaking from abdominal wound HPI: [name] is an 59 y.o. white female  with a history of alcoholic cirrhosis with ascites who suffered a perforated gastric ulcer 2 weeks ago with Phillip Heal patch and open wound requiring healing by secondary intention. Her home health nurses noted that he was having significant amount of presumed ascitic leakage from her wound and she was sent here for further evaluation. She was found to have significant azotemia with a creatinine rising from normal to 5 over the last 2 weeks. She was also found to be anemic with no gross evidence of GI bleeding such as melena hematochezia or hematemesis. CT scan showed significant inflammation in the area of surgery as well as cirrhosis multiple collaterals and extensive ascites. There is no hydronephrosis or hydroureter. The patient was started on broad-spectrum antibiotics and albumin and is scheduled for large volume paracentesis today with surgery and renal consult requested. She has had a reasonable urine output overnight.  Past Medical History  Diagnosis Date  . Cirrhosis of liver ([place])   . Hemorrhoid     Past Surgical History  Procedure Laterality Date  . Tonsillectomy    . Appendectomy    . Colonoscopy  2006    Normal  . Excisional hemorrhoidectomy    . Laparotomy N/A 10/10/2014    Procedure: EXPLORATORY LAPAROTOMY, OMENTAL GRAHAM PATCHING OF PRE-PYLORIC GASTRIC ULCER, AND PRIMARY UMBILICAL HERNIA REPAIR;  Surgeon: Stark Klein, MD;  Location: [facility];  Service: General;  Laterality: N/A;    Medications Prior to Admission  Medication Sig Dispense Refill  . feeding supplement, ENSURE COMPLETE, (ENSURE COMPLETE) LIQD Take 237 mLs by mouth 2 (two) times daily between meals.    . furosemide (LASIX) 20 MG tablet Take 1 tablet (20 mg total) by  mouth 2 (two) times daily. 60 tablet 0  . OxyCODONE HCl (OXYCONTIN PO) Take 10 mg by mouth every 4 (four) hours as needed. For pain    . polyethylene glycol (MIRALAX / GLYCOLAX) packet Take 17 g by mouth daily as needed for mild constipation or moderate constipation. 14 each 0  . spironolactone (ALDACTONE) 50 MG tablet Take 50 mg by mouth once.    . pantoprazole (PROTONIX) 40 MG tablet Take 1 tablet (40 mg total) by mouth 2 (two) times daily. (Patient not taking: Reported on 10/28/2014) 60 tablet 3  . [DISCONTINUED] ciprofloxacin (CIPRO) 500 MG tablet Take 1 tablet (500 mg total) by mouth 2 (two) times daily. Take for 2 days (Patient not taking: Reported on 11/04/2014) 4 tablet 0  . [DISCONTINUED] ferrous sulfate 324 (65 FE) MG TBEC Take 1 tablet (325 mg total) by mouth 2 (two) times daily. (Patient not taking: Reported on 10/21/2014) 60 tablet 1  . [DISCONTINUED] metroNIDAZOLE (FLAGYL) 500 MG tablet Take 1 tablet (500 mg total) by mouth 3 (three) times daily. Take for 2 days (Patient not taking: Reported on 11/02/2014) 6 tablet 0    Allergies:  Allergies  Allergen Reactions  . Penicillins Hives    Has patient had a PCN reaction causing immediate rash, facial/tongue/throat swelling, SOB or lightheadedness with hypotension: Yes Has patient had a PCN reaction causing severe rash involving mucus membranes or skin necrosis: No Has patient had a PCN reaction that required hospitalization No Has patient had a PCN reaction occurring within the last 10 years: No If all of  the above answers are "NO", then may proceed with Cephalosporin use.    Family History  Problem Relation Age of Onset  . Hypertension Mother   . Dementia Father   . Colon cancer Neg Hx   . Alcohol abuse Brother   . Alcohol abuse Brother   . Alcohol abuse Brother     Social History:  reports that she has been smoking Cigarettes.  She has a 20 pack-year smoking history. She has never used smokeless tobacco. She reports that she  does not drink alcohol or use illicit drugs.  Review of Systems: negative except for worsening abdominal pain, tenderness and malaise.   Blood pressure 110/51, pulse 107, temperature 98.2 F (36.8 C), temperature source Oral, resp. rate 28, height 5' 4"  (1.626 m), weight 58.6 kg (129 lb 3 oz), SpO2 97 %. Head: Normocephalic, without obvious abnormality, atraumatic Neck: no adenopathy, no carotid bruit, no JVD, supple, symmetrical, trachea midline and thyroid not enlarged, symmetric, no tenderness/mass/nodules Resp: clear to auscultation bilaterally Cardio: regular rate and rhythm, S1, S2 normal, no murmur, click, rub or gallop GI: Abdomen is symmetrically distended somewhat taut, 12 x 6 cm open wound, no visible ascites draining at this time. Extremities: extremities normal, atraumatic, no cyanosis or edema  Results for orders placed or performed during the hospital encounter of 11/15/2014 (from the past 48 hour(s))  CBC with Differential     Status: Abnormal   Collection Time: 10/21/2014  2:42 PM  Result Value Ref Range   WBC 11.3 (H) 4.0 - 10.5 K/uL   RBC 2.61 (L) 3.87 - 5.11 MIL/uL   Hemoglobin 6.5 (LL) 12.0 - 15.0 g/dL    Comment: REPEATED TO VERIFY CRITICAL RESULT CALLED TO, READ BACK BY AND VERIFIED WITH: FLACK,S RN AT 1509 ON 10.14.2016 BY JOHNSON,W    HCT 20.4 (L) 36.0 - 46.0 %   MCV 78.2 78.0 - 100.0 fL   MCH 24.9 (L) 26.0 - 34.0 pg   MCHC 31.9 30.0 - 36.0 g/dL   RDW 24.4 (H) 11.5 - 15.5 %   Platelets 168 150 - 400 K/uL   Neutrophils Relative % 81 %   Lymphocytes Relative 9 %   Monocytes Relative 9 %   Eosinophils Relative 1 %   Basophils Relative 0 %   Neutro Abs 9.2 (H) 1.7 - 7.7 K/uL   Lymphs Abs 1.0 0.7 - 4.0 K/uL   Monocytes Absolute 1.0 0.1 - 1.0 K/uL   Eosinophils Absolute 0.1 0.0 - 0.7 K/uL   Basophils Absolute 0.0 0.0 - 0.1 K/uL   RBC Morphology FEW ELLIPTCYTES AND FEW TARGET CELLS NOTED     Comment: POLYCHROMASIA PRESENT  Comprehensive metabolic panel      Status: Abnormal   Collection Time: 10/25/2014  2:42 PM  Result Value Ref Range   Sodium 133 (L) 135 - 145 mmol/L   Potassium 5.2 (H) 3.5 - 5.1 mmol/L   Chloride 102 101 - 111 mmol/L   CO2 20 (L) 22 - 32 mmol/L   Glucose, Bld 105 (H) 65 - 99 mg/dL   BUN 115 (H) 6 - 20 mg/dL   Creatinine, Ser 5.16 (H) 0.44 - 1.00 mg/dL   Calcium 7.8 (L) 8.9 - 10.3 mg/dL   Total Protein 6.0 (L) 6.5 - 8.1 g/dL   Albumin 1.7 (L) 3.5 - 5.0 g/dL   AST 61 (H) 15 - 41 U/L   ALT 20 14 - 54 U/L   Alkaline Phosphatase 258 (H) 38 - 126 U/L  Total Bilirubin 2.3 (H) 0.3 - 1.2 mg/dL   GFR calc non Af Amer 8 (L) >60 mL/min   GFR calc Af Amer 10 (L) >60 mL/min    Comment: (NOTE) The eGFR has been calculated using the CKD EPI equation. This calculation has not been validated in all clinical situations. eGFR's persistently <60 mL/min signify possible Chronic Kidney Disease.    Anion gap 11 5 - 15  I-Stat CG4 Lactic Acid, ED     Status: None   Collection Time: 11/13/2014  2:48 PM  Result Value Ref Range   Lactic Acid, Venous 1.33 0.5 - 2.0 mmol/L  Lipase, blood     Status: None   Collection Time: 10/28/2014  3:42 PM  Result Value Ref Range   Lipase 30 22 - 51 U/L  Protime-INR     Status: Abnormal   Collection Time: 11/15/2014  4:07 PM  Result Value Ref Range   Prothrombin Time 20.6 (H) 11.6 - 15.2 seconds   INR 1.77 (H) 0.00 - 1.49  Brain natriuretic peptide     Status: Abnormal   Collection Time: 11/09/2014  4:07 PM  Result Value Ref Range   B Natriuretic Peptide 172.4 (H) 0.0 - 100.0 pg/mL  POC occult blood, ED     Status: None   Collection Time: 11/03/2014  4:18 PM  Result Value Ref Range   Fecal Occult Bld NEGATIVE NEGATIVE  Prepare RBC     Status: None   Collection Time: 11/11/2014  4:30 PM  Result Value Ref Range   Order Confirmation ORDER PROCESSED BY BLOOD BANK   Type and screen [place]     Status: None (Preliminary result)   Collection Time: 10/21/2014  4:30 PM  Result Value Ref Range    ABO/RH(D) O POS    Antibody Screen NEG    Sample Expiration 11/08/14    Unit Number [id number]    Blood Component Type RED CELLS,LR    Unit division 00    Status of Unit ISSUED    Transfusion Status OK TO TRANSFUSE    Crossmatch Result Compatible   Urinalysis, Routine w reflex microscopic (not at [facility])     Status: Abnormal   Collection Time: 10/24/2014  5:35 PM  Result Value Ref Range   Color, Urine YELLOW YELLOW   APPearance CLOUDY (A) CLEAR   Specific Gravity, Urine 1.016 1.005 - 1.030   pH 5.0 5.0 - 8.0   Glucose, UA NEGATIVE NEGATIVE mg/dL   Hgb urine dipstick LARGE (A) NEGATIVE   Bilirubin Urine MODERATE (A) NEGATIVE   Ketones, ur 15 (A) NEGATIVE mg/dL   Protein, ur NEGATIVE NEGATIVE mg/dL   Urobilinogen, UA 0.2 0.0 - 1.0 mg/dL   Nitrite NEGATIVE NEGATIVE   Leukocytes, UA SMALL (A) NEGATIVE  Na and K (sodium & potassium), rand urine     Status: None   Collection Time: 10/31/2014  5:35 PM  Result Value Ref Range   Sodium, Ur <10 mmol/L    Comment: REPEATED TO VERIFY   Potassium Urine Timed 63 mmol/L  Creatinine, urine, random     Status: None   Collection Time: 11/10/2014  5:35 PM  Result Value Ref Range   Creatinine, Urine 95.40 mg/dL  Urine culture     Status: None (Preliminary result)   Collection Time: 10/28/2014  5:35 PM  Result Value Ref Range   Specimen Description URINE, CATHETERIZED    Special Requests NONE    Culture NO GROWTH < 24 HOURS  Report Status PENDING   Gram stain     Status: None   Collection Time: 10/28/2014  5:35 PM  Result Value Ref Range   Specimen Description URINE, CATHETERIZED    Special Requests NONE    Gram Stain      WBC PRESENT,BOTH PMN AND MONONUCLEAR NO ORGANISMS SEEN CYTOSPIN    Report Status 10/28/2014 FINAL   Urine microscopic-add on     Status: None   Collection Time: 10/17/2014  5:35 PM  Result Value Ref Range   Squamous Epithelial / LPF RARE RARE   WBC, UA 3-6 <3 WBC/hpf   RBC / HPF 21-50 <3 RBC/hpf   Bacteria, UA RARE  RARE  I-Stat CG4 Lactic Acid, ED     Status: None   Collection Time: 10/28/2014  5:46 PM  Result Value Ref Range   Lactic Acid, Venous 1.42 0.5 - 2.0 mmol/L  Blood culture (routine x 2)     Status: None (Preliminary result)   Collection Time: 11/08/2014  6:16 PM  Result Value Ref Range   Specimen Description BLOOD RIGHT HAND    Special Requests IN PEDIATRIC BOTTLE 3CC    Culture PENDING    Report Status PENDING   Lactic acid, plasma     Status: None   Collection Time: 10/30/14 12:17 AM  Result Value Ref Range   Lactic Acid, Venous 1.0 0.5 - 2.0 mmol/L  Lactic acid, plasma     Status: None   Collection Time: 10/30/14  2:19 AM  Result Value Ref Range   Lactic Acid, Venous 1.3 0.5 - 2.0 mmol/L  Comprehensive metabolic panel     Status: Abnormal   Collection Time: 10/30/14  2:19 AM  Result Value Ref Range   Sodium 129 (L) 135 - 145 mmol/L   Potassium 5.4 (H) 3.5 - 5.1 mmol/L   Chloride 98 (L) 101 - 111 mmol/L   CO2 17 (L) 22 - 32 mmol/L   Glucose, Bld 74 65 - 99 mg/dL   BUN 111 (H) 6 - 20 mg/dL   Creatinine, Ser 4.92 (H) 0.44 - 1.00 mg/dL   Calcium 7.6 (L) 8.9 - 10.3 mg/dL   Total Protein 6.1 (L) 6.5 - 8.1 g/dL   Albumin 1.9 (L) 3.5 - 5.0 g/dL   AST 56 (H) 15 - 41 U/L   ALT 19 14 - 54 U/L   Alkaline Phosphatase 270 (H) 38 - 126 U/L   Total Bilirubin 2.3 (H) 0.3 - 1.2 mg/dL   GFR calc non Af Amer 9 (L) >60 mL/min   GFR calc Af Amer 10 (L) >60 mL/min    Comment: (NOTE) The eGFR has been calculated using the CKD EPI equation. This calculation has not been validated in all clinical situations. eGFR's persistently <60 mL/min signify possible Chronic Kidney Disease.    Anion gap 14 5 - 15  CBC     Status: Abnormal   Collection Time: 10/30/14  2:19 AM  Result Value Ref Range   WBC 7.3 4.0 - 10.5 K/uL   RBC 2.91 (L) 3.87 - 5.11 MIL/uL   Hemoglobin 7.6 (L) 12.0 - 15.0 g/dL   HCT 23.4 (L) 36.0 - 46.0 %   MCV 80.4 78.0 - 100.0 fL   MCH 26.1 26.0 - 34.0 pg   MCHC 32.5 30.0 - 36.0  g/dL   RDW 23.6 (H) 11.5 - 15.5 %   Platelets 147 (L) 150 - 400 K/uL  Protime-INR     Status: Abnormal   Collection Time:  10/30/14  2:19 AM  Result Value Ref Range   Prothrombin Time 19.4 (H) 11.6 - 15.2 seconds   INR 1.64 (H) 0.00 - 1.49   Ct Abdomen Pelvis Wo Contrast  10/19/2014  CLINICAL DATA:  59 year old female with recent surgery for perforated ulcer. Abdominal distention. Draining wound. Hypotension. EXAM: CT ABDOMEN AND PELVIS WITHOUT CONTRAST TECHNIQUE: Multidetector CT imaging of the abdomen and pelvis was performed following the standard protocol without IV contrast. COMPARISON:  CT the abdomen and pelvis 10/10/2014. FINDINGS: Lower chest: Moderate right and small left pleural effusions lying dependently. Atherosclerotic calcifications in the left anterior descending, left circumflex and right coronary arteries. Hepatobiliary: The liver has a shrunken appearance and nodular contour, compatible with underlying cirrhosis. No discrete hepatic lesion confidently identified on today's noncontrast CT examination. The unenhanced appearance of the gallbladder is unremarkable. Pancreas: No definite pancreatic mass identified on today's noncontrast CT examination. Spleen: Spleen is enlarged measuring 13.8 x 7.2 x 15.6 (estimated splenic volume of 775 mL). Adrenals/Urinary Tract: Several nonobstructive calculi are present within the right renal collecting system, the largest of which measures 6 mm in the upper pole. Some no additional calculi are identified in the left renal collecting system, along the course of either ureter or within the lumen of the urinary bladder. No hydroureteronephrosis to suggest urinary tract obstruction at this time. Urinary bladder is normal in appearance. Stomach/Bowel: There is profound thickening of the gastric wall in the antral pre-pyloric region of the stomach, best appreciated on image 41 of series 5. No pathologic dilatation of small bowel or colon.  Vascular/Lymphatic: Atherosclerosis throughout the abdominal and pelvic vasculature, without evidence of aneurysm. No definite lymphadenopathy noted in the abdomen or pelvis on today's noncontrast CT examination. Reproductive: Coarsely calcified lesion measuring 4.1 cm in the fundus of the uterus, presumably a calcified fibroid. Ovaries are poorly demonstrated on today's noncontrast CT examination. Other: Large volume of ascites. Extensive nodular thickening of the omentum, which could simply be related to portosystemic collateral vessels, but is suspicious for potential peritoneal spread of malignancy. Some areas of peritoneal thickening and mild nodularity are also noted, but poorly depicted on today's noncontrast CT (example in the right-sided the abdomen on image 51 of series 5). No pneumoperitoneum. Small umbilical hernia containing some ascites. Musculoskeletal: There are no aggressive appearing lytic or blastic lesions noted in the visualized portions of the skeleton. IMPRESSION: 1. Profound soft tissue thickening in the region of the gastric antrum, presumably related to residual inflammation at site of prior perforated gastric ulcer. No evidence of residual extravasation of oral contrast material on today's examination. 2. Very nodular appearance of the omentum, concerning for intraperitoneal spread of malignancy. Alternatively, this may simply reflect multiple collateral pathways (portosystemic collateral pathways) in this patient with history of cirrhosis, but correlation with cytology from nonemergent paracentesis is suggested in the near future to exclude the presence of malignant intraperitoneal cells. 3. Stigmata of advanced cirrhosis with splenomegaly, suggesting portal hypertension. 4. Moderate right and small left pleural effusions lying dependently. 5. Extensive atherosclerosis, including at least 3 vessel coronary artery disease. 6. Additional incidental findings, as above. Electronically Signed    By: Vinnie Langton M.D.   On: 10/27/2014 19:55   Dg Abd Acute W/chest  11/07/2014  CLINICAL DATA:  History abdominal surgery last week. Abdominal pain and distention. Weakness in the lower body. EXAM: DG ABDOMEN ACUTE W/ 1V CHEST COMPARISON:  CT abdomen and pelvis 10/10/2014. FINDINGS: The heart size is normal. Chronic interstitial coarsening is present.  Recur and left basilar atelectasis is present. A relative paucity bowel gas is present. There is no evidence for obstruction or free air. Degenerative changes are again noted in the lower lumbar spine. A calcified uterine fibroid is again noted. IMPRESSION: 1. Bibasilar airspace opacity likely reflects atelectasis. 2. Relative paucity of bowel gas without evidence for obstruction or free air. 3. Calcified uterine fibroid. Electronically Signed   By: San Morelle M.D.   On: 10/31/2014 15:44    Assessment: 1. Perforated gastric ulcer, status post Phillip Heal patch with open wound healing by secondary intention 2. Alcoholic cirrhosis with ascites 3. Probable SBP 4. Anemia 5. Probable hepatorenal syndrome Plan:  1. 1. Agree with antibiotics, albumin and moderate to large-volume paracentesis up to 4 or 5 L 2. Renal consult. If cause of a AKI is determined to be likely hepatorenal syndrome as opposed to ATN or other etiology, consider norepinephrine versus Midodrine/octreotide 3. Agree with surgical consult 4. Empiric PPI 5. Hold diuretics until seen by renal 6. We'll monitor stools and hemoglobin for GI bleeding 7. Would transfuse packed red blood cells to keep hemoglobin greater than 9.    Shelvia Fojtik C 10/30/2014, 8:14 AM  Pager [phone] If no answer or after 5 PM call [phone]

## 2014-10-30 NOTE — Progress Notes (Signed)
Paged wound/ostomy nurses at 1200 in regards to placement of wound vac. Have not received call back. Will continue to monitor pt.

## 2014-10-30 NOTE — Procedures (Signed)
LLQ para No comp/EBL

## 2014-10-31 DIAGNOSIS — K255 Chronic or unspecified gastric ulcer with perforation: Secondary | ICD-10-CM

## 2014-10-31 LAB — COMPREHENSIVE METABOLIC PANEL
ALBUMIN: 2.1 g/dL — AB (ref 3.5–5.0)
ALT: 19 U/L (ref 14–54)
ANION GAP: 19 — AB (ref 5–15)
AST: 52 U/L — AB (ref 15–41)
Alkaline Phosphatase: 252 U/L — ABNORMAL HIGH (ref 38–126)
BILIRUBIN TOTAL: 3.3 mg/dL — AB (ref 0.3–1.2)
BUN: 116 mg/dL — AB (ref 6–20)
CO2: 17 mmol/L — AB (ref 22–32)
Calcium: 8.2 mg/dL — ABNORMAL LOW (ref 8.9–10.3)
Chloride: 101 mmol/L (ref 101–111)
Creatinine, Ser: 5.38 mg/dL — ABNORMAL HIGH (ref 0.44–1.00)
GFR calc Af Amer: 9 mL/min — ABNORMAL LOW (ref >60)
GFR calc non Af Amer: 8 mL/min — ABNORMAL LOW (ref >60)
GLUCOSE: 103 mg/dL — AB (ref 65–99)
POTASSIUM: 5.3 mmol/L — AB (ref 3.5–5.1)
SODIUM: 137 mmol/L (ref 135–145)
TOTAL PROTEIN: 6.4 g/dL — AB (ref 6.5–8.1)

## 2014-10-31 LAB — TYPE AND SCREEN
ABO/RH(D): O POS
Antibody Screen: NEGATIVE
UNIT DIVISION: 0
UNIT DIVISION: 0

## 2014-10-31 LAB — URINE CULTURE: CULTURE: NO GROWTH

## 2014-10-31 LAB — OCCULT BLOOD X 1 CARD TO LAB, STOOL: Fecal Occult Bld: POSITIVE — AB

## 2014-10-31 LAB — CBC
HEMATOCRIT: 27 % — AB (ref 36.0–46.0)
HEMOGLOBIN: 8.8 g/dL — AB (ref 12.0–15.0)
MCH: 26.2 pg (ref 26.0–34.0)
MCHC: 32.6 g/dL (ref 30.0–36.0)
MCV: 80.4 fL (ref 78.0–100.0)
Platelets: 142 10*3/uL — ABNORMAL LOW (ref 150–400)
RBC: 3.36 MIL/uL — AB (ref 3.87–5.11)
RDW: 22.2 % — ABNORMAL HIGH (ref 11.5–15.5)
WBC: 11.5 10*3/uL — ABNORMAL HIGH (ref 4.0–10.5)

## 2014-10-31 LAB — AMMONIA: Ammonia: 23 umol/L (ref 9–35)

## 2014-10-31 MED ORDER — BISACODYL 10 MG RE SUPP: 10.0000 mg | Freq: Every day | RECTAL | Status: DC

## 2014-10-31 MED ORDER — PANTOPRAZOLE SODIUM 40 MG PO TBEC: 40.0000 mg | DELAYED_RELEASE_TABLET | Freq: Two times a day (BID) | ORAL | Status: DC

## 2014-10-31 MED ORDER — LIP MEDEX EX OINT: 1.0000 "application " | TOPICAL_OINTMENT | Freq: Two times a day (BID) | CUTANEOUS | Status: DC

## 2014-10-31 MED ORDER — MIDODRINE HCL 5 MG PO TABS
10.0000 mg | ORAL_TABLET | Freq: Three times a day (TID) | ORAL | Status: DC
  Administered 2014-10-31: 10 mg via ORAL
  Filled 2014-10-31 (×5): qty 2

## 2014-10-31 MED ORDER — MENTHOL 3 MG MT LOZG
1.0000 | LOZENGE | OROMUCOSAL | Status: DC | PRN
  Filled 2014-10-31: qty 9

## 2014-10-31 MED ORDER — OCTREOTIDE ACETATE 100 MCG/ML IJ SOLN
100.0000 ug | Freq: Three times a day (TID) | INTRAMUSCULAR | Status: DC
  Administered 2014-10-31 (×3): 100 ug via SUBCUTANEOUS
  Filled 2014-10-31 (×6): qty 1

## 2014-10-31 MED ORDER — ENSURE COMPLETE PO LIQD
237.0000 mL | Freq: Two times a day (BID) | ORAL | Status: DC
  Administered 2014-10-31: 237 mL via ORAL
  Filled 2014-10-31 (×6): qty 237

## 2014-10-31 MED ORDER — ALUM & MAG HYDROXIDE-SIMETH 200-200-20 MG/5ML PO SUSP: 30.0000 mL | Freq: Four times a day (QID) | ORAL | Status: DC | PRN

## 2014-10-31 MED ORDER — PHENOL 1.4 % MT LIQD: 2.0000 | OROMUCOSAL | Status: DC | PRN

## 2014-10-31 MED ORDER — BLISTEX MEDICATED EX OINT
TOPICAL_OINTMENT | Freq: Two times a day (BID) | CUTANEOUS | Status: DC | PRN
  Administered 2014-10-31: 1 via TOPICAL
  Filled 2014-10-31 (×2): qty 10

## 2014-10-31 MED ORDER — SODIUM POLYSTYRENE SULFONATE 15 GM/60ML PO SUSP
30.0000 g | Freq: Once | ORAL | Status: AC
  Administered 2014-10-31: 30 g via ORAL
  Filled 2014-10-31: qty 120

## 2014-10-31 MED ORDER — MAGIC MOUTHWASH
15.0000 mL | Freq: Four times a day (QID) | ORAL | Status: DC | PRN
  Filled 2014-10-31: qty 15

## 2014-10-31 MED ORDER — FUROSEMIDE 10 MG/ML IJ SOLN
80.0000 mg | Freq: Once | INTRAMUSCULAR | Status: AC
  Administered 2014-10-31: 80 mg via INTRAVENOUS
  Filled 2014-10-31: qty 8

## 2014-10-31 NOTE — Progress Notes (Signed)
Pt refused chair and ambulating. Will continue to monitor pt.

## 2014-10-31 NOTE — Progress Notes (Addendum)
Subjective: Readmitted for failure to thrive with abd distention & ascites ICU RN in room No leakage from incision.  Wound vac just placed Pt tired Not eating much - feels full  Objective: Vital signs in last 24 hours: Temp:  [97.7 F (36.5 C)-98.3 F (36.8 C)] 97.7 F (36.5 C) (10/16 0847) Pulse Rate:  [102-111] 111 (10/16 0847) Resp:  [19-27] 19 (10/16 0847) BP: (88-99)/(35-52) 94/45 mmHg (10/16 0847) SpO2:  [96 %-98 %] 97 % (10/16 0847)    Intake/Output from previous day: 10/15 0701 - 10/16 0700 In: 1154.2 [P.O.:280; I.V.:539.2; Blood:335] Out: 225 [Urine:225]   Intake/Output this shift:   General: Pt more alert, oriented x4 in no acute distress.  cachectic Eyes: PERRL, normal EOM. Sclera icteric Neuro: CN II-XII intact w/o focal sensory/motor deficits. Lymph: No head/neck/groin lymphadenopathy Psych:  No delerium/psychosis/paranoia HENT: Normocephalic, Mucus membranes moist.  No thrush Neck: Supple, No tracheal deviation Chest: No pain.  Good respiratory excursion. CV:  Pulses intact.   Regular rhythm MS: Normal AROM mjr joints.  No obvious deformity  Abd: Somewhat firm & very distended with ascites, +fluid wave.  Wound is open w exposed suture & mild necrosis inferiorly.  No active leaking at this time.  H/o some serous (ascites) drainage.  Umb hernia soft & reducible.  No guarding/peritonitis    Ext:  SCDs BLE.  No significant edema.  No cyanosis Skin: No petechiae / purpura.  Mild jaundice    Lab Results:   Recent Labs  10/30/14 2046 10/31/14 0257  WBC 9.4 11.5*  HGB 8.8* 8.8*  HCT 26.7* 27.0*  PLT 115* 142*   BMET  Recent Labs  10/30/14 0219 10/31/14 0257  NA 129* 137  K 5.4* 5.3*  CL 98* 101  CO2 17* 17*  GLUCOSE 74 103*  BUN 111* 116*  CREATININE 4.92* 5.38*  CALCIUM 7.6* 8.2*   PT/INR  Recent Labs  11/07/2014 1607 10/30/14 0219  LABPROT 20.6* 19.4*  INR 1.77* 1.64*    Studies/Results: Ct Abdomen Pelvis Wo  Contrast  11/09/2014  CLINICAL DATA:  59 year old female with recent surgery for perforated ulcer. Abdominal distention. Draining wound. Hypotension. EXAM: CT ABDOMEN AND PELVIS WITHOUT CONTRAST TECHNIQUE: Multidetector CT imaging of the abdomen and pelvis was performed following the standard protocol without IV contrast. COMPARISON:  CT the abdomen and pelvis 10/10/2014. FINDINGS: Lower chest: Moderate right and small left pleural effusions lying dependently. Atherosclerotic calcifications in the left anterior descending, left circumflex and right coronary arteries. Hepatobiliary: The liver has a shrunken appearance and nodular contour, compatible with underlying cirrhosis. No discrete hepatic lesion confidently identified on today's noncontrast CT examination. The unenhanced appearance of the gallbladder is unremarkable. Pancreas: No definite pancreatic mass identified on today's noncontrast CT examination. Spleen: Spleen is enlarged measuring 13.8 x 7.2 x 15.6 (estimated splenic volume of 775 mL). Adrenals/Urinary Tract: Several nonobstructive calculi are present within the right renal collecting system, the largest of which measures 6 mm in the upper pole. Some no additional calculi are identified in the left renal collecting system, along the course of either ureter or within the lumen of the urinary bladder. No hydroureteronephrosis to suggest urinary tract obstruction at this time. Urinary bladder is normal in appearance. Stomach/Bowel: There is profound thickening of the gastric wall in the antral pre-pyloric region of the stomach, best appreciated on image 41 of series 5. No pathologic dilatation of small bowel or colon. Vascular/Lymphatic: Atherosclerosis throughout the abdominal and pelvic vasculature, without evidence of aneurysm. No definite lymphadenopathy  noted in the abdomen or pelvis on today's noncontrast CT examination. Reproductive: Coarsely calcified lesion measuring 4.1 cm in the fundus of  the uterus, presumably a calcified fibroid. Ovaries are poorly demonstrated on today's noncontrast CT examination. Other: Large volume of ascites. Extensive nodular thickening of the omentum, which could simply be related to portosystemic collateral vessels, but is suspicious for potential peritoneal spread of malignancy. Some areas of peritoneal thickening and mild nodularity are also noted, but poorly depicted on today's noncontrast CT (example in the right-sided the abdomen on image 51 of series 5). No pneumoperitoneum. Small umbilical hernia containing some ascites. Musculoskeletal: There are no aggressive appearing lytic or blastic lesions noted in the visualized portions of the skeleton. IMPRESSION: 1. Profound soft tissue thickening in the region of the gastric antrum, presumably related to residual inflammation at site of prior perforated gastric ulcer. No evidence of residual extravasation of oral contrast material on today's examination. 2. Very nodular appearance of the omentum, concerning for intraperitoneal spread of malignancy. Alternatively, this may simply reflect multiple collateral pathways (portosystemic collateral pathways) in this patient with history of cirrhosis, but correlation with cytology from nonemergent paracentesis is suggested in the near future to exclude the presence of malignant intraperitoneal cells. 3. Stigmata of advanced cirrhosis with splenomegaly, suggesting portal hypertension. 4. Moderate right and small left pleural effusions lying dependently. 5. Extensive atherosclerosis, including at least 3 vessel coronary artery disease. 6. Additional incidental findings, as above. Electronically Signed   By: Vinnie Langton M.D.   On: 11/07/2014 19:55   US Paracentesis  10/30/2014  CLINICAL DATA:  Cirrhosis and ascites EXAM: ULTRASOUND GUIDED PARACENTESIS TECHNIQUE: Survey ultrasound of the abdomen was performed and an appropriate skin entry site in the left lower abdomen was  selected. Skin site was marked, prepped with Betadine, and draped in usual sterile fashion, and infiltrated locally with 1% lidocaine. A 5 French multisidehole Yueh sheath needle was advanced into the peritoneal space until fluid could be aspirated. The sheath was advanced and the needle removed. 1.8 L of clearascites were aspirated. COMPLICATIONS: None IMPRESSION: Technically successful ultrasound guided paracentesis, removing 1.8 L of ascites. Electronically Signed   By: Marybelle Killings M.D.   On: 10/30/2014 14:45   Dg Abd Acute W/chest  10/28/2014  CLINICAL DATA:  History abdominal surgery last week. Abdominal pain and distention. Weakness in the lower body. EXAM: DG ABDOMEN ACUTE W/ 1V CHEST COMPARISON:  CT abdomen and pelvis 10/10/2014. FINDINGS: The heart size is normal. Chronic interstitial coarsening is present. Recur and left basilar atelectasis is present. A relative paucity bowel gas is present. There is no evidence for obstruction or free air. Degenerative changes are again noted in the lower lumbar spine. A calcified uterine fibroid is again noted. IMPRESSION: 1. Bibasilar airspace opacity likely reflects atelectasis. 2. Relative paucity of bowel gas without evidence for obstruction or free air. 3. Calcified uterine fibroid. Electronically Signed   By: San Morelle M.D.   On: 11/09/2014 15:44    Anti-infectives: Anti-infectives    Start     Dose/Rate Route Frequency Ordered Stop   10/31/14 2200  vancomycin (VANCOCIN) IVPB 1000 mg/200 mL premix     1,000 mg 200 mL/hr over 60 Minutes Intravenous Every 48 hours 11/08/2014 2131     11/07/2014 2145  vancomycin (VANCOCIN) 1,250 mg in sodium chloride 0.9 % 250 mL IVPB     1,250 mg 166.7 mL/hr over 90 Minutes Intravenous  Once 11/05/2014 2126 11/03/2014 2342   10/27/2014 2130  cefTAZidime (  FORTAZ) 2 g in dextrose 5 % 50 mL IVPB     2 g 100 mL/hr over 30 Minutes Intravenous Every 48 hours 11/11/2014 2125       OR 10/10/2014  PRE-OPERATIVE  DIAGNOSIS: perforated gastric ulcer, cirrhosis with ascites  POST-OPERATIVE DIAGNOSIS: Same  PROCEDURE: Procedure(s): Exploratory laparotomy, graham patch repair of pre pyloric gastric ulcer, primary umbilical repair (not part of the surgical incision)  SURGEON: Surgeon(s): Stark Klein, MD  Principal Problem:   Hepatorenal syndrome ([place]) Active Problems:   Perforated gastric ulcer s/p omental Phillip Heal patch 7/35/3299   Alcoholic cirrhosis of liver with ascites ([facility])   Severe protein-calorie malnutrition ([facility])   AKI (acute kidney injury) ([place])   Abdominal distension   Pressure ulcer   Ascites    Assessment/Plan: Failure to thrive  Recurrent massive ascites - "Technically successful ultrasound guided paracentesis 10/30/2014, removing 1.8 L of ascites."  Try to reverse hepatorenal syndrome - a major challenge  Doubt intraabdominal infection - ABx per TRH  ?Nodules on omentum are unlikely cancer as she was just operated on this is likely postop swelling & signs of portal HTN.  No evidence of carcinomatosis on ex lap so radiology probably overcalling CT scan.  Resume a diet as possible - supp shakes  Supp.  Not obstructed nor full of stool.  Ileus w massive ascites  NEEDS PPI x 6 weeks for perf ulcer.  Consider H.pylori eval / Tx  Start wound vac to help incision close faster & minimize/control any leaking.  Prob will need wound debridement to remove suture & SQ ncrosis but hold off for now to avoid full fascial dehiscence.   [facility] RNs insist on WOCN do it 1st.  I'm not in the mood to argue w them...  The patient is stable.  There is no evidence of peritonitis, acute abdomen, nor shock.  There is no strong evidence of failure of improvement nor decline with current non-operative management.  There is no need for surgery at the present moment.   We will continue to follow 2x /week   Lucee Brissett C. 10/31/2014

## 2014-10-31 NOTE — Progress Notes (Signed)
Paged WOCN. Awaiting call back.

## 2014-10-31 NOTE — Care Management Note (Addendum)
Case Management Note  Patient Details  Name: WALAA CAREL MRN: [id number] Date of Birth: [birth year]  Subjective/Objective:                 Admitted with hepatorenal syndrome/SIRS, leaking wound(wound vac), past medical history significant for alcoholic cirrhosis and recently repaired perforated gastric ulcer. Lives alone.    Action/Plan: Return to home when medically stable. CM to f/u with d/c needs.  Expected Discharge Date:                  Expected Discharge Plan:  [place]  In-House Referral:     Discharge planning Services  CM Consult  Post Acute Care Choice:  Resumption of Svcs/PTA Provider Choice offered to:     DME Arranged:    DME Agency:  [facility] Arranged:  RN, PT [facility] Agency:  [facility]  Status of Service:  In process, will continue to follow  Medicare Important Message Given:    Date Medicare IM Given:    Medicare IM give by:    Date Additional Medicare IM Given:    Additional Medicare Important Message give by:     If discussed at [place] of Stay Meetings, dates discussed:    Additional Comments: Senie Lanese (Daughter)  [phone]  [place], [place] [phone] 10/31/2014, 7:45 PM

## 2014-10-31 NOTE — Progress Notes (Signed)
Utilization Review Completed.[name] T10/16/2016  

## 2014-10-31 NOTE — Progress Notes (Signed)
[facility] Progress Note  Subjective: Still complaining of significant abdominal pain unchanged since yesterday. Status post large-volume paracentesis.  Objective: Vital signs in last 24 hours: Temp:  [97.7 F (36.5 C)-98.3 F (36.8 C)] 97.7 F (36.5 C) (10/16 0847) Pulse Rate:  [102-111] 111 (10/16 0847) Resp:  [19-27] 19 (10/16 0847) BP: (89-99)/(35-52) 94/45 mmHg (10/16 0847) SpO2:  [96 %-98 %] 97 % (10/16 0847) Weight change:    PE: Unchanged. Wound did not examined today  Lab Results: Results for orders placed or performed during the hospital encounter of 10/31/2014 (from the past 24 hour(s))  Lactate dehydrogenase (CSF, pleural or peritoneal fluid)     Status: Abnormal   Collection Time: 10/30/14 11:33 AM  Result Value Ref Range   LD, Fluid 74 (H) 3 - 23 U/L   Fluid Type-FLDH Peritoneal   Protein, pleural or peritoneal fluid     Status: None   Collection Time: 10/30/14 11:33 AM  Result Value Ref Range   Total protein, fluid <3.0 g/dL   Fluid Type-FTP FLUID   Body fluid cell count with differential     Status: Abnormal   Collection Time: 10/30/14 11:33 AM  Result Value Ref Range   Fluid Type-FCT FLUID    Color, Fluid YELLOW YELLOW   Appearance, Fluid CLEAR CLEAR   WBC, Fluid 117 0 - 1000 cu mm   Neutrophil Count, Fluid 32 (H) 0 - 25 %   Lymphs, Fluid 51 %   Monocyte-Macrophage-Serous Fluid 17 (L) 50 - 90 %   Other Cells, Fluid MESOTHELIAL CELLS PRESENT %  Culture, body fluid-bottle     Status: None (Preliminary result)   Collection Time: 10/30/14 11:33 AM  Result Value Ref Range   Specimen Description FLUID ABDOMEN PERITONEAL    Special Requests BAA 10CCS    Culture PENDING    Report Status PENDING   Gram stain     Status: None   Collection Time: 10/30/14 11:33 AM  Result Value Ref Range   Specimen Description FLUID ABDOMEN PERITONEAL    Special Requests NONE    Gram Stain      FEW WBC PRESENT,BOTH PMN AND MONONUCLEAR NO ORGANISMS SEEN     Report Status 10/30/2014 FINAL   Prepare RBC     Status: None   Collection Time: 10/30/14  2:30 PM  Result Value Ref Range   Order Confirmation ORDER PROCESSED BY BLOOD BANK   CBC     Status: Abnormal   Collection Time: 10/30/14  8:46 PM  Result Value Ref Range   WBC 9.4 4.0 - 10.5 K/uL   RBC 3.34 (L) 3.87 - 5.11 MIL/uL   Hemoglobin 8.8 (L) 12.0 - 15.0 g/dL   HCT 26.7 (L) 36.0 - 46.0 %   MCV 79.9 78.0 - 100.0 fL   MCH 26.3 26.0 - 34.0 pg   MCHC 33.0 30.0 - 36.0 g/dL   RDW 22.2 (H) 11.5 - 15.5 %   Platelets 115 (L) 150 - 400 K/uL  Comprehensive metabolic panel     Status: Abnormal   Collection Time: 10/31/14  2:57 AM  Result Value Ref Range   Sodium 137 135 - 145 mmol/L   Potassium 5.3 (H) 3.5 - 5.1 mmol/L   Chloride 101 101 - 111 mmol/L   CO2 17 (L) 22 - 32 mmol/L   Glucose, Bld 103 (H) 65 - 99 mg/dL   BUN 116 (H) 6 - 20 mg/dL   Creatinine, Ser 5.38 (H) 0.44 - 1.00 mg/dL  Calcium 8.2 (L) 8.9 - 10.3 mg/dL   Total Protein 6.4 (L) 6.5 - 8.1 g/dL   Albumin 2.1 (L) 3.5 - 5.0 g/dL   AST 52 (H) 15 - 41 U/L   ALT 19 14 - 54 U/L   Alkaline Phosphatase 252 (H) 38 - 126 U/L   Total Bilirubin 3.3 (H) 0.3 - 1.2 mg/dL   GFR calc non Af Amer 8 (L) >60 mL/min   GFR calc Af Amer 9 (L) >60 mL/min   Anion gap 19 (H) 5 - 15  Ammonia     Status: None   Collection Time: 10/31/14  2:57 AM  Result Value Ref Range   Ammonia 23 9 - 35 umol/L  CBC     Status: Abnormal   Collection Time: 10/31/14  2:57 AM  Result Value Ref Range   WBC 11.5 (H) 4.0 - 10.5 K/uL   RBC 3.36 (L) 3.87 - 5.11 MIL/uL   Hemoglobin 8.8 (L) 12.0 - 15.0 g/dL   HCT 27.0 (L) 36.0 - 46.0 %   MCV 80.4 78.0 - 100.0 fL   MCH 26.2 26.0 - 34.0 pg   MCHC 32.6 30.0 - 36.0 g/dL   RDW 22.2 (H) 11.5 - 15.5 %   Platelets 142 (L) 150 - 400 K/uL    Studies/Results: Ct Abdomen Pelvis Wo Contrast  11/12/2014  CLINICAL DATA:  59 year old female with recent surgery for perforated ulcer. Abdominal distention. Draining wound.  Hypotension. EXAM: CT ABDOMEN AND PELVIS WITHOUT CONTRAST TECHNIQUE: Multidetector CT imaging of the abdomen and pelvis was performed following the standard protocol without IV contrast. COMPARISON:  CT the abdomen and pelvis 10/10/2014. FINDINGS: Lower chest: Moderate right and small left pleural effusions lying dependently. Atherosclerotic calcifications in the left anterior descending, left circumflex and right coronary arteries. Hepatobiliary: The liver has a shrunken appearance and nodular contour, compatible with underlying cirrhosis. No discrete hepatic lesion confidently identified on today's noncontrast CT examination. The unenhanced appearance of the gallbladder is unremarkable. Pancreas: No definite pancreatic mass identified on today's noncontrast CT examination. Spleen: Spleen is enlarged measuring 13.8 x 7.2 x 15.6 (estimated splenic volume of 775 mL). Adrenals/Urinary Tract: Several nonobstructive calculi are present within the right renal collecting system, the largest of which measures 6 mm in the upper pole. Some no additional calculi are identified in the left renal collecting system, along the course of either ureter or within the lumen of the urinary bladder. No hydroureteronephrosis to suggest urinary tract obstruction at this time. Urinary bladder is normal in appearance. Stomach/Bowel: There is profound thickening of the gastric wall in the antral pre-pyloric region of the stomach, best appreciated on image 41 of series 5. No pathologic dilatation of small bowel or colon. Vascular/Lymphatic: Atherosclerosis throughout the abdominal and pelvic vasculature, without evidence of aneurysm. No definite lymphadenopathy noted in the abdomen or pelvis on today's noncontrast CT examination. Reproductive: Coarsely calcified lesion measuring 4.1 cm in the fundus of the uterus, presumably a calcified fibroid. Ovaries are poorly demonstrated on today's noncontrast CT examination. Other: Large volume of  ascites. Extensive nodular thickening of the omentum, which could simply be related to portosystemic collateral vessels, but is suspicious for potential peritoneal spread of malignancy. Some areas of peritoneal thickening and mild nodularity are also noted, but poorly depicted on today's noncontrast CT (example in the right-sided the abdomen on image 51 of series 5). No pneumoperitoneum. Small umbilical hernia containing some ascites. Musculoskeletal: There are no aggressive appearing lytic or blastic lesions noted in the  visualized portions of the skeleton. IMPRESSION: 1. Profound soft tissue thickening in the region of the gastric antrum, presumably related to residual inflammation at site of prior perforated gastric ulcer. No evidence of residual extravasation of oral contrast material on today's examination. 2. Very nodular appearance of the omentum, concerning for intraperitoneal spread of malignancy. Alternatively, this may simply reflect multiple collateral pathways (portosystemic collateral pathways) in this patient with history of cirrhosis, but correlation with cytology from nonemergent paracentesis is suggested in the near future to exclude the presence of malignant intraperitoneal cells. 3. Stigmata of advanced cirrhosis with splenomegaly, suggesting portal hypertension. 4. Moderate right and small left pleural effusions lying dependently. 5. Extensive atherosclerosis, including at least 3 vessel coronary artery disease. 6. Additional incidental findings, as above. Electronically Signed   By: Vinnie Langton M.D.   On: 10/28/2014 19:55   US Paracentesis  10/30/2014  CLINICAL DATA:  Cirrhosis and ascites EXAM: ULTRASOUND GUIDED PARACENTESIS TECHNIQUE: Survey ultrasound of the abdomen was performed and an appropriate skin entry site in the left lower abdomen was selected. Skin site was marked, prepped with Betadine, and draped in usual sterile fashion, and infiltrated locally with 1% lidocaine. A 5  French multisidehole Yueh sheath needle was advanced into the peritoneal space until fluid could be aspirated. The sheath was advanced and the needle removed. 1.8 L of clearascites were aspirated. COMPLICATIONS: None IMPRESSION: Technically successful ultrasound guided paracentesis, removing 1.8 L of ascites. Electronically Signed   By: Marybelle Killings M.D.   On: 10/30/2014 14:45   Dg Abd Acute W/chest  10/19/2014  CLINICAL DATA:  History abdominal surgery last week. Abdominal pain and distention. Weakness in the lower body. EXAM: DG ABDOMEN ACUTE W/ 1V CHEST COMPARISON:  CT abdomen and pelvis 10/10/2014. FINDINGS: The heart size is normal. Chronic interstitial coarsening is present. Recur and left basilar atelectasis is present. A relative paucity bowel gas is present. There is no evidence for obstruction or free air. Degenerative changes are again noted in the lower lumbar spine. A calcified uterine fibroid is again noted. IMPRESSION: 1. Bibasilar airspace opacity likely reflects atelectasis. 2. Relative paucity of bowel gas without evidence for obstruction or free air. 3. Calcified uterine fibroid. Electronically Signed   By: San Morelle M.D.   On: 10/20/2014 15:44      Assessment: 1. Perforated gastric ulcer, being followed by surgery 2. Alcoholic cirrhosis with ascites. Paracentesis fluid analysis does not diagnostic of SBP 3. Anemia, stool surprisingly heme-negative 4. Hepatorenal syndrome  Plan: 1. Continue albumin, octreotide and midodrine 2. Management of wound per surgery 3. Will sign off for now, assuming renal is following managing hepatorenal syndrome. Please call if further GI input needed.    Teodor Prater C 10/31/2014, 11:23 AM  Pager [phone] If no answer or after 5 PM call [phone]

## 2014-10-31 NOTE — Consult Note (Signed)
Reason for Consult:  AKI Referring Physician:   [name] is an 59 y.o. female.  HPI: Pt is a 59yo WF with PMH sig for alcoholic cirrhosis with worsening ascites requiring more frequent paracenteses over the last 2 months who was recently admitted to [facility] on 10/10/14 with a perforated gastric ulcer requiring laprascopic repair and was discharged on 10/16/14.  She presented again to [facility] with worsening ascites and drainage of ascitic fluid from her surgical wound.  She has also noted worsening abdominal and back pain, anorexia, and increased abdominal girth.  In the ED she was noted to be anemic with hgb of 6.5 and hypotensive with BP's in the 80's/40's.  She was also noted to have a Scr of 5.  We have been asked to evaluate the patient for her AKI, the trend in Scr is seen below.  Her urine sodium was <10 raising concern for the development of hepatorenal syndrome.  She is not a candidate for liver transplant due to recent alcohol consumption (she reports abstinence since May but no documentation or AA).  Trend in Creatinine:  CREATININE, SER  Date/Time Value Ref Range Status  10/31/2014 02:57 AM 5.38* 0.44 - 1.00 mg/dL Final  10/30/2014 02:19 AM 4.92* 0.44 - 1.00 mg/dL Final  10/27/2014 02:42 PM 5.16* 0.44 - 1.00 mg/dL Final  10/16/2014 05:10 AM 1.03* 0.44 - 1.00 mg/dL Final  10/15/2014 06:28 AM 1.16* 0.44 - 1.00 mg/dL Final  10/14/2014 05:05 AM 1.19* 0.44 - 1.00 mg/dL Final  10/13/2014 05:00 AM 1.36* 0.44 - 1.00 mg/dL Final  10/12/2014 08:09 PM 1.33* 0.44 - 1.00 mg/dL Final  10/12/2014 04:42 AM 1.48* 0.44 - 1.00 mg/dL Final  10/11/2014 06:46 AM 1.74* 0.44 - 1.00 mg/dL Final  10/11/2014 12:37 AM 1.72* 0.44 - 1.00 mg/dL Final  10/10/2014 06:54 PM 1.80* 0.44 - 1.00 mg/dL Final  10/10/2014 07:10 AM 1.65* 0.44 - 1.00 mg/dL Final  07/08/2014 11:41 AM 0.71 0.44 - 1.00 mg/dL Final  07/07/2014 08:36 AM 0.82 0.44 - 1.00 mg/dL Final  05/25/2014 06:16 AM 0.60 0.44 - 1.00 mg/dL Final   05/24/2014 05:20 AM 0.64 0.44 - 1.00 mg/dL Final  05/23/2014 06:36 AM 0.54 0.44 - 1.00 mg/dL Final  05/22/2014 12:45 PM 0.75 0.44 - 1.00 mg/dL Corrected  05/21/2014 09:09 PM 0.57 0.44 - 1.00 mg/dL Final  05/21/2014 01:58 PM 0.62 0.44 - 1.00 mg/dL Final    PMH:   Past Medical History  Diagnosis Date  . Cirrhosis of liver ([place])   . Hemorrhoid     PSH:   Past Surgical History  Procedure Laterality Date  . Tonsillectomy    . Appendectomy    . Colonoscopy  2006    Normal  . Excisional hemorrhoidectomy    . Laparotomy N/A 10/10/2014    Procedure: EXPLORATORY LAPAROTOMY, OMENTAL GRAHAM PATCHING OF PRE-PYLORIC GASTRIC ULCER, AND PRIMARY UMBILICAL HERNIA REPAIR;  Surgeon: Stark Klein, MD;  Location: [place];  Service: General;  Laterality: N/A;    Allergies:  Allergies  Allergen Reactions  . Penicillins Hives    Has patient had a PCN reaction causing immediate rash, facial/tongue/throat swelling, SOB or lightheadedness with hypotension: Yes Has patient had a PCN reaction causing severe rash involving mucus membranes or skin necrosis: No Has patient had a PCN reaction that required hospitalization No Has patient had a PCN reaction occurring within the last 10 years: No If all of the above answers are "NO", then may proceed with Cephalosporin use.    Medications:  Prior to Admission medications   Medication Sig Start Date End Date Taking? Authorizing Provider  feeding supplement, ENSURE COMPLETE, (ENSURE COMPLETE) LIQD Take 237 mLs by mouth 2 (two) times daily between meals. 10/16/14  Yes Albertine Patricia, MD  furosemide (LASIX) 20 MG tablet Take 1 tablet (20 mg total) by mouth 2 (two) times daily. 05/25/14  Yes Dustin Flock, MD  OxyCODONE HCl (OXYCONTIN PO) Take 10 mg by mouth every 4 (four) hours as needed. For pain   Yes Historical Provider, MD  polyethylene glycol (MIRALAX / GLYCOLAX) packet Take 17 g by mouth daily as needed for mild constipation or moderate constipation.  10/16/14  Yes Albertine Patricia, MD  spironolactone (ALDACTONE) 50 MG tablet Take 50 mg by mouth once.   Yes Historical Provider, MD  pantoprazole (PROTONIX) 40 MG tablet Take 1 tablet (40 mg total) by mouth 2 (two) times daily. Patient not taking: Reported on 10/30/2014 10/16/14   Albertine Patricia, MD    Inpatient medications: . sodium chloride   Intravenous Once  . cefTAZidime (FORTAZ)  IV  2 g Intravenous Q48H  . Chlorhexidine Gluconate Cloth  6 each Topical Q0600  . Influenza vac split quadrivalent PF  0.5 mL Intramuscular Tomorrow-1000  . mupirocin ointment  1 application Nasal BID  . saccharomyces boulardii  250 mg Oral BID  . vancomycin  1,000 mg Intravenous Q48H    Discontinued Meds:   Medications Discontinued During This Encounter  Medication Reason  . ferrous sulfate 324 (65 FE) MG TBEC   . metroNIDAZOLE (FLAGYL) 500 MG tablet   . ciprofloxacin (CIPRO) 500 MG tablet   . promethazine (PHENERGAN) injection 6.25-12.5 mg   . sodium chloride 0.9 % injection 3 mL     Social History:  reports that she has been smoking Cigarettes.  She has a 20 pack-year smoking history. She has never used smokeless tobacco. She reports that she does not drink alcohol or use illicit drugs.  Family History:   Family History  Problem Relation Age of Onset  . Hypertension Mother   . Dementia Father   . Colon cancer Neg Hx   . Alcohol abuse Brother   . Alcohol abuse Brother   . Alcohol abuse Brother     Pertinent items are noted in HPI. Weight change:   Intake/Output Summary (Last 24 hours) at 10/31/14 0850 Last data filed at 10/31/14 0600  Gross per 24 hour  Intake 1154.17 ml  Output    225 ml  Net 929.17 ml   BP 94/45 mmHg  Pulse 111  Temp(Src) 97.7 F (36.5 C) (Oral)  Resp 19  Ht 5\' 4"  (1.626 m)  Wt 58.6 kg (129 lb 3 oz)  BMI 22.16 kg/m2  SpO2 97% Filed Vitals:   10/30/14 2222 10/30/14 2355 10/31/14 0334 10/31/14 0847  BP: 94/42 96/45 99/52  94/45  Pulse: 105 108 110 111   Temp:  97.9 F (36.6 C) 98 F (36.7 C) 97.7 F (36.5 C)  TempSrc:  Oral Oral Oral  Resp: 24 24 27 19   Height:      Weight:      SpO2: 97% 97% 98% 97%     General appearance: cachectic, fatigued and slowed mentation Head: Normocephalic, without obvious abnormality, atraumatic Eyes: negative findings: lids and lashes normal and corneas clear Neck: no adenopathy, no carotid bruit, no JVD, supple, symmetrical, trachea midline and thyroid not enlarged, symmetric, no tenderness/mass/nodules Resp: diminished breath sounds bibasilar Cardio: tachycardic, no rub GI: +BS, markedly distended, tense,  surgical wound packing in place along ventral surface, small umbillical hernia Extremities: edema 2+ Neurologic:  +asterixis  Labs: Basic Metabolic Panel:  Recent Labs Lab 11/15/2014 1442 10/30/14 0219 10/31/14 0257  NA 133* 129* 137  K 5.2* 5.4* 5.3*  CL 102 98* 101  CO2 20* 17* 17*  GLUCOSE 105* 74 103*  BUN 115* 111* 116*  CREATININE 5.16* 4.92* 5.38*  ALBUMIN 1.7* 1.9* 2.1*  CALCIUM 7.8* 7.6* 8.2*   Liver Function Tests:  Recent Labs Lab 10/27/2014 1442 10/30/14 0219 10/31/14 0257  AST 61* 56* 52*  ALT 20 19 19   ALKPHOS 258* 270* 252*  BILITOT 2.3* 2.3* 3.3*  PROT 6.0* 6.1* 6.4*  ALBUMIN 1.7* 1.9* 2.1*    Recent Labs Lab 10/28/2014 1542  LIPASE 30    Recent Labs Lab 10/31/14 0257  AMMONIA 23   CBC:  Recent Labs Lab 10/24/2014 1442 10/30/14 0219 10/30/14 2046 10/31/14 0257  WBC 11.3* 7.3 9.4 11.5*  NEUTROABS 9.2*  --   --   --   HGB 6.5* 7.6* 8.8* 8.8*  HCT 20.4* 23.4* 26.7* 27.0*  MCV 78.2 80.4 79.9 80.4  PLT 168 147* 115* 142*   PT/INR: @LABRCNTIP (inr:5) Cardiac Enzymes: )No results for input(s): CKTOTAL, CKMB, CKMBINDEX, TROPONINI in the last 168 hours. CBG: No results for input(s): GLUCAP in the last 168 hours.  Iron Studies: No results for input(s): IRON, TIBC, TRANSFERRIN, FERRITIN in the last 168 hours.  Xrays/Other Studies: Ct Abdomen  Pelvis Wo Contrast  10/24/2014  CLINICAL DATA:  59 year old female with recent surgery for perforated ulcer. Abdominal distention. Draining wound. Hypotension. EXAM: CT ABDOMEN AND PELVIS WITHOUT CONTRAST TECHNIQUE: Multidetector CT imaging of the abdomen and pelvis was performed following the standard protocol without IV contrast. COMPARISON:  CT the abdomen and pelvis 10/10/2014. FINDINGS: Lower chest: Moderate right and small left pleural effusions lying dependently. Atherosclerotic calcifications in the left anterior descending, left circumflex and right coronary arteries. Hepatobiliary: The liver has a shrunken appearance and nodular contour, compatible with underlying cirrhosis. No discrete hepatic lesion confidently identified on today's noncontrast CT examination. The unenhanced appearance of the gallbladder is unremarkable. Pancreas: No definite pancreatic mass identified on today's noncontrast CT examination. Spleen: Spleen is enlarged measuring 13.8 x 7.2 x 15.6 (estimated splenic volume of 775 mL). Adrenals/Urinary Tract: Several nonobstructive calculi are present within the right renal collecting system, the largest of which measures 6 mm in the upper pole. Some no additional calculi are identified in the left renal collecting system, along the course of either ureter or within the lumen of the urinary bladder. No hydroureteronephrosis to suggest urinary tract obstruction at this time. Urinary bladder is normal in appearance. Stomach/Bowel: There is profound thickening of the gastric wall in the antral pre-pyloric region of the stomach, best appreciated on image 41 of series 5. No pathologic dilatation of small bowel or colon. Vascular/Lymphatic: Atherosclerosis throughout the abdominal and pelvic vasculature, without evidence of aneurysm. No definite lymphadenopathy noted in the abdomen or pelvis on today's noncontrast CT examination. Reproductive: Coarsely calcified lesion measuring 4.1 cm in the  fundus of the uterus, presumably a calcified fibroid. Ovaries are poorly demonstrated on today's noncontrast CT examination. Other: Large volume of ascites. Extensive nodular thickening of the omentum, which could simply be related to portosystemic collateral vessels, but is suspicious for potential peritoneal spread of malignancy. Some areas of peritoneal thickening and mild nodularity are also noted, but poorly depicted on today's noncontrast CT (example in the right-sided the abdomen on image  51 of series 5). No pneumoperitoneum. Small umbilical hernia containing some ascites. Musculoskeletal: There are no aggressive appearing lytic or blastic lesions noted in the visualized portions of the skeleton. IMPRESSION: 1. Profound soft tissue thickening in the region of the gastric antrum, presumably related to residual inflammation at site of prior perforated gastric ulcer. No evidence of residual extravasation of oral contrast material on today's examination. 2. Very nodular appearance of the omentum, concerning for intraperitoneal spread of malignancy. Alternatively, this may simply reflect multiple collateral pathways (portosystemic collateral pathways) in this patient with history of cirrhosis, but correlation with cytology from nonemergent paracentesis is suggested in the near future to exclude the presence of malignant intraperitoneal cells. 3. Stigmata of advanced cirrhosis with splenomegaly, suggesting portal hypertension. 4. Moderate right and small left pleural effusions lying dependently. 5. Extensive atherosclerosis, including at least 3 vessel coronary artery disease. 6. Additional incidental findings, as above. Electronically Signed   By: Vinnie Langton M.D.   On: 10/25/2014 19:55   US Paracentesis  10/30/2014  CLINICAL DATA:  Cirrhosis and ascites EXAM: ULTRASOUND GUIDED PARACENTESIS TECHNIQUE: Survey ultrasound of the abdomen was performed and an appropriate skin entry site in the left lower  abdomen was selected. Skin site was marked, prepped with Betadine, and draped in usual sterile fashion, and infiltrated locally with 1% lidocaine. A 5 French multisidehole Yueh sheath needle was advanced into the peritoneal space until fluid could be aspirated. The sheath was advanced and the needle removed. 1.8 L of clearascites were aspirated. COMPLICATIONS: None IMPRESSION: Technically successful ultrasound guided paracentesis, removing 1.8 L of ascites. Electronically Signed   By: Marybelle Killings M.D.   On: 10/30/2014 14:45   Dg Abd Acute W/chest  10/28/2014  CLINICAL DATA:  History abdominal surgery last week. Abdominal pain and distention. Weakness in the lower body. EXAM: DG ABDOMEN ACUTE W/ 1V CHEST COMPARISON:  CT abdomen and pelvis 10/10/2014. FINDINGS: The heart size is normal. Chronic interstitial coarsening is present. Recur and left basilar atelectasis is present. A relative paucity bowel gas is present. There is no evidence for obstruction or free air. Degenerative changes are again noted in the lower lumbar spine. A calcified uterine fibroid is again noted. IMPRESSION: 1. Bibasilar airspace opacity likely reflects atelectasis. 2. Relative paucity of bowel gas without evidence for obstruction or free air. 3. Calcified uterine fibroid. Electronically Signed   By: San Morelle M.D.   On: 10/30/2014 15:44     Assessment/Plan: 1.  AKI, oliguric- in setting of hypotension, ABLA, and paracentesis (1.8L) due to worsening ascites/LFT's most worrisome for hepatorenal.  She is not a candidate for liver transplant and therefore not eligible for renal replacement therapy.  Will start her on octreotide and midodrine in hopes that she will start to recover.  She should also benefit from blood transfusion to help renal perfusion which was done with improved Hgb and BP.  Continue to follow closely 2. Hyperkalemia- due to #1.  Will dose with kayexalate and one dose of IV lasix and follow  K. 3. Hepatorenal Syndrome/worsening ascites/cirrhosis- poor prognosis.  Will start octreotide and midodrine as above and follow 4. ABLA- s/p blood transfusion with improved Hgb.  Continue to follow and transfuse prn 5. Nephrolithiasis- right kidney non-obstructive 6. Omental thickening- worrisome for malignancy in light of lytic/blastic skeletal lesions 7. Protein and caloric malnutrition- per primary svc 8. Lytic/blastic lesions of skeletal system- worrisome for malignancy 9. Disposition- overall prognosis is poor.  Recommend palliative care consult to help set goals/limits  of care in this very ill woman with possible occult malignancy and worsening cirrhosis and now possible hepatorenal syndrome.   Elkins A 10/31/2014, 8:50 AM

## 2014-10-31 NOTE — Progress Notes (Signed)
[name] TEAM 1 - Stepdown/ICU TEAM PROGRESS NOTE  [name] OIN:[id number] DOB: [birth year] DOA: 10/16/2014 PCP: Donnie Coffin, MD  Admit HPI / Brief Narrative: 59 y.o. female with a history of alcoholic cirrhosis and recently repaired perforated gastric ulcer who presented with leaking from her wound and AKI.  The patient was diagnosed with alcoholic cirrhosis within the last year and started recurrening large volume paracentesis this summer. 3 weeks ago she was admitted for abdominal pain and underwent exploratory laparotomy and then [facility] patch of a ruptured ulcer. She had been at home rehabilitating since the surgery and receiving home health for daily wound packing (without a wound VAC). The day of her admit her home health nurses noted marked increase in drainage from her wound and recommended she come to the ED.   In the ED, the patient was anemic to 6.5 g/dL with blood pressure in the 80s over 40s. She received 1 unit of blood and her hemoglobin increased to 9. She was hypothermic with a rectal temperature of 95, and albumin 1.7, and a creatinine of 5 from a baseline around 1. A CT showed large volume ascites.  HPI/Subjective: The patient is laying in bed staring off into space like yesterday.  Her daughter is present.  She gives me permission to speak in front of her daughter.  She c/o some pain in her abdom, which is noticeably more distended today.  She denies current sob, n/v, or cp.    Assessment/Plan:  SIRS HR 107 + RR28 at presentation - peritoneal fluid studies are not c/w SBP - pt remains afebrile - will stop abx and follow clinically   Alcoholic Cirrhosis with ascites -MELD score is 30 giving a 3 month mortality of ~50% (though acute renal failure in pt not a candidate for HD will make her life expectancy much much shorter - I have explained the grave nature of her current situation to the patient and her daughter - s/p paracentesis with rapid reaccumulation of ascites  - patient swears she has not had a drink since May 2016  Acute kidney failure- hepatorenal syndrome  ATN versus prerenal injury versus hepatorenal syndrome - appears most c/w hepatorenal - Nephrology now following - trial of midodrine and octreotide being initiated - pt has been informed that failure to respond to this tx w/ improving renal fxn signifies a very poor prognosis with a life expectancy of likely only weeks (in that she is not a HD candidate per Nephrology)  Recent perforated gastric ulcer S/p repair w/ Phillip Heal patch - open wound healing by secondary intention - Gen Surgery following  Anemia of unclear cause  No clear evidence of acute blood loss - transfused 2 units PRBC thus far - hemoglobin holding steady at present   Omental thickening   Hyperkalemia  Due to acute renal failure - kayexalate as needed   Hyponatremia Suspect related to cirrhosis - improving   Code Status: FULL Family Communication: Spoke with patient and daughter at bedside at length Disposition Plan: SDU  Consultants: GI Gen Surgery Nephrology    Procedures: 10/15 - 1.8L paracentesis   Antibiotics: Ceftaz 10/14 > 10/16 Vancomycin 10/14 > 10/16  DVT prophylaxis: SCDs  Objective: Blood pressure 100/46, pulse 107, temperature 97.7 F (36.5 C), temperature source Oral, resp. rate 21, height 5\' 4"  (1.626 m), weight 58.6 kg (129 lb 3 oz), SpO2 97 %.  Intake/Output Summary (Last 24 hours) at 10/31/14 1333 Last data filed at 10/31/14 1300  Gross per 24  hour  Intake 1444.17 ml  Output    275 ml  Net 1169.17 ml   Exam: General: No acute respiratory distress - alert and conversant though affect is extremely flat Lungs: Clear to auscultation bilaterally without wheezes or crackles - poor air movement bilateral bases Cardiovascular: Tachycardic at 113 bpm without appreciable murmur gallop or rub Abdomen: Obvious worsening ascites, distended, large midline wound dressed clean and dry, bowel  sounds positive, no rebound Extremities: No significant cyanosis, clubbing, or edema bilateral lower extremities  Data Reviewed: Basic Metabolic Panel:  Recent Labs Lab 10/28/2014 1442 10/30/14 0219 10/31/14 0257  NA 133* 129* 137  K 5.2* 5.4* 5.3*  CL 102 98* 101  CO2 20* 17* 17*  GLUCOSE 105* 74 103*  BUN 115* 111* 116*  CREATININE 5.16* 4.92* 5.38*  CALCIUM 7.8* 7.6* 8.2*    CBC:  Recent Labs Lab 10/18/2014 1442 10/30/14 0219 10/30/14 2046 10/31/14 0257  WBC 11.3* 7.3 9.4 11.5*  NEUTROABS 9.2*  --   --   --   HGB 6.5* 7.6* 8.8* 8.8*  HCT 20.4* 23.4* 26.7* 27.0*  MCV 78.2 80.4 79.9 80.4  PLT 168 147* 115* 142*    Liver Function Tests:  Recent Labs Lab 11/09/2014 1442 10/30/14 0219 10/31/14 0257  AST 61* 56* 52*  ALT 20 19 19   ALKPHOS 258* 270* 252*  BILITOT 2.3* 2.3* 3.3*  PROT 6.0* 6.1* 6.4*  ALBUMIN 1.7* 1.9* 2.1*    Recent Labs Lab 11/04/2014 1542  LIPASE 30    Recent Labs Lab 10/31/14 0257  AMMONIA 23    Coags:  Recent Labs Lab 10/19/2014 1607 10/30/14 0219  INR 1.77* 1.64*    Recent Results (from the past 240 hour(s))  Blood culture (routine x 2)     Status: None (Preliminary result)   Collection Time: 10/24/2014  5:28 PM  Result Value Ref Range Status   Specimen Description BLOOD RIGHT WRIST  Final   Special Requests BOTTLES DRAWN AEROBIC AND ANAEROBIC 5CC  Final   Culture NO GROWTH < 24 HOURS  Final   Report Status PENDING  Incomplete  Urine culture     Status: None (Preliminary result)   Collection Time: 11/02/2014  5:35 PM  Result Value Ref Range Status   Specimen Description URINE, CATHETERIZED  Final   Special Requests NONE  Final   Culture NO GROWTH < 24 HOURS  Final   Report Status PENDING  Incomplete  Gram stain     Status: None   Collection Time: 11/10/2014  5:35 PM  Result Value Ref Range Status   Specimen Description URINE, CATHETERIZED  Final   Special Requests NONE  Final   Gram Stain   Final    WBC PRESENT,BOTH PMN  AND MONONUCLEAR NO ORGANISMS SEEN CYTOSPIN    Report Status 10/28/2014 FINAL  Final  Blood culture (routine x 2)     Status: None (Preliminary result)   Collection Time: 11/11/2014  6:16 PM  Result Value Ref Range Status   Specimen Description BLOOD RIGHT HAND  Final   Special Requests IN PEDIATRIC BOTTLE 3CC  Final   Culture NO GROWTH < 24 HOURS  Final   Report Status PENDING  Incomplete  Culture, body fluid-bottle     Status: None (Preliminary result)   Collection Time: 10/30/14 11:33 AM  Result Value Ref Range Status   Specimen Description FLUID ABDOMEN PERITONEAL  Final   Special Requests BAA 10CCS  Final   Culture PENDING  Incomplete   Report  Status PENDING  Incomplete  Gram stain     Status: None   Collection Time: 10/30/14 11:33 AM  Result Value Ref Range Status   Specimen Description FLUID ABDOMEN PERITONEAL  Final   Special Requests NONE  Final   Gram Stain   Final    FEW WBC PRESENT,BOTH PMN AND MONONUCLEAR NO ORGANISMS SEEN    Report Status 10/30/2014 FINAL  Final     Studies:   Recent x-ray studies have been reviewed in detail by the Attending Physician  Scheduled Meds:  Scheduled Meds: . sodium chloride   Intravenous Once  . bisacodyl  10 mg Rectal Daily  . cefTAZidime (FORTAZ)  IV  2 g Intravenous Q48H  . Chlorhexidine Gluconate Cloth  6 each Topical Q0600  . feeding supplement (ENSURE COMPLETE)  237 mL Oral BID BM  . midodrine  10 mg Oral TID WC  . mupirocin ointment  1 application Nasal BID  . octreotide  100 mcg Subcutaneous TID  . saccharomyces boulardii  250 mg Oral BID  . vancomycin  1,000 mg Intravenous Q48H    Time spent on care of this patient: 35 mins   MCCLUNG,JEFFREY T , MD   [facility] Office  [phone] Pager - Text Page per Shea Evans as per below:  On-Call/Text Page:      [email or web address]      password [email or web address]  If 7PM-7AM, please contact night-coverage [email or web address] Password [email or web address] 10/31/2014, 1:33 PM   LOS: 2 days

## 2014-11-01 LAB — COMPREHENSIVE METABOLIC PANEL
ALBUMIN: 2 g/dL — AB (ref 3.5–5.0)
ALT: 22 U/L (ref 14–54)
AST: 88 U/L — AB (ref 15–41)
Alkaline Phosphatase: 248 U/L — ABNORMAL HIGH (ref 38–126)
Anion gap: 21 — ABNORMAL HIGH (ref 5–15)
BUN: 119 mg/dL — AB (ref 6–20)
CHLORIDE: 103 mmol/L (ref 101–111)
CO2: 16 mmol/L — ABNORMAL LOW (ref 22–32)
CREATININE: 5.96 mg/dL — AB (ref 0.44–1.00)
Calcium: 8.4 mg/dL — ABNORMAL LOW (ref 8.9–10.3)
GFR calc Af Amer: 8 mL/min — ABNORMAL LOW (ref >60)
GFR, EST NON AFRICAN AMERICAN: 7 mL/min — AB (ref >60)
GLUCOSE: 77 mg/dL (ref 65–99)
POTASSIUM: 5.8 mmol/L — AB (ref 3.5–5.1)
Sodium: 140 mmol/L (ref 135–145)
Total Bilirubin: 3.9 mg/dL — ABNORMAL HIGH (ref 0.3–1.2)
Total Protein: 6.1 g/dL — ABNORMAL LOW (ref 6.5–8.1)

## 2014-11-01 LAB — PROTIME-INR
INR: 2.84 — ABNORMAL HIGH (ref 0.00–1.49)
Prothrombin Time: 29.4 seconds — ABNORMAL HIGH (ref 11.6–15.2)

## 2014-11-01 LAB — CBC
HEMATOCRIT: 27.7 % — AB (ref 36.0–46.0)
Hemoglobin: 8.9 g/dL — ABNORMAL LOW (ref 12.0–15.0)
MCH: 26.1 pg (ref 26.0–34.0)
MCHC: 32.1 g/dL (ref 30.0–36.0)
MCV: 81.2 fL (ref 78.0–100.0)
PLATELETS: 174 10*3/uL (ref 150–400)
RBC: 3.41 MIL/uL — AB (ref 3.87–5.11)
RDW: 22 % — ABNORMAL HIGH (ref 11.5–15.5)
WBC: 19.2 10*3/uL — AB (ref 4.0–10.5)

## 2014-11-01 LAB — PATHOLOGIST SMEAR REVIEW

## 2014-11-01 MED ORDER — SODIUM CHLORIDE 0.9 % IV SOLN
1.0000 mg/h | INTRAVENOUS | Status: DC
  Administered 2014-11-01: 2 mg/h via INTRAVENOUS
  Filled 2014-11-01: qty 10

## 2014-11-01 MED ORDER — SODIUM POLYSTYRENE SULFONATE 15 GM/60ML PO SUSP
30.0000 g | Freq: Once | ORAL | Status: DC
  Filled 2014-11-01: qty 120

## 2014-11-01 MED ORDER — MORPHINE SULFATE (PF) 2 MG/ML IV SOLN: 1.0000 mg | INTRAVENOUS | Status: DC | PRN

## 2014-11-01 MED ORDER — ALBUMIN HUMAN 25 % IV SOLN
25.0000 g | Freq: Four times a day (QID) | INTRAVENOUS | Status: DC
  Administered 2014-11-01: 25 g via INTRAVENOUS
  Filled 2014-11-01 (×5): qty 100

## 2014-11-01 MED ORDER — LORAZEPAM 2 MG/ML IJ SOLN: 1.0000 mg | INTRAMUSCULAR | Status: DC | PRN

## 2014-11-01 MED ORDER — SODIUM CHLORIDE 0.9 % IV BOLUS (SEPSIS)
500.0000 mL | Freq: Once | INTRAVENOUS | Status: AC
  Administered 2014-11-01: 500 mL via INTRAVENOUS

## 2014-11-03 LAB — CULTURE, BLOOD (ROUTINE X 2)
Culture: NO GROWTH
Culture: NO GROWTH

## 2014-11-04 ENCOUNTER — Ambulatory Visit: Payer: Medicaid Other

## 2014-11-05 LAB — CULTURE, BODY FLUID-BOTTLE

## 2014-11-05 LAB — CULTURE, BODY FLUID W GRAM STAIN -BOTTLE

## 2014-11-16 NOTE — Discharge Summary (Signed)
Death Summary  [name] DTO:[id number] DOB: December 17, 1955 DOA: 10/31/2014  PCP: Donnie Coffin, MD  Admit date: 2014/10/31 Date of Death: 03-Nov-2014  Final Diagnoses:    Hepatorenal syndrome    Alcoholic cirrhosis of liver with ascites    Severe protein-calorie malnutrition    AKI (acute kidney injury)   Abdominal distension   Pressure ulcer   Ascites   Perforated gastric ulcer s/p omental Phillip Heal patch 10/10/2014  History of present illness:  59 y.o. female with a history of alcoholic cirrhosis and recently repaired perforated gastric ulcer who presented with leaking from her wound and AKI. The patient was diagnosed with alcoholic cirrhosis within the last year and started recurrening large volume paracentesis this summer. 3 weeks ago she was admitted for abdominal pain and underwent exploratory laparotomy and then [facility] patch of a ruptured ulcer. She had been at home rehabilitating since the surgery and receiving home health for daily wound packing (without a wound VAC). The day of her admit her home health nurses noted marked increase in drainage from her wound and recommended she come to the ED.   In the ED, the patient was anemic to 6.5 g/dL with blood pressure in the 80s over 40s. She received 1 unit of blood and her hemoglobin increased to 9. She was hypothermic with a rectal temperature of 95, and albumin 1.7, and a creatinine of 5 from a baseline around 1. A CT showed large volume ascites  Hospital Course:  The patient was admitted to the acute units.  A low-volume paracentesis was accomplished and ruled out SBP.  Empiric antibiotics were discontinued.  The patient felt somewhat better symptomatically after her paracentesis.  Unfortunately, despite a blood transfusion and crystalloid volume expansion her acute renal failure did not improve.  It was felt she was suffering with hepatorenal syndrome and nephrology was consulted.  Nephrology agreed and the patient was placed on  midodrine and octreotide.  Unfortunately the patient's renal function did not improve with these measures and instead continued to decline.  Likewise the patient's hepatic function declined as well.  On 10/31/2014 the patient's meld score was 30.  On 03-Nov-2014 the patient's meld score had increased to 40.  Her blood pressure began to drop and she became tachycardic and tachypnea.  Though at the initial portion of the hospital stay the patient was interactive her mental status declined to the point that she would primarily stareand cannot provide a reliable history.  A couple of discussions were held with the patient's daughter and the grave nature of her illness was described in detail.  On 1017 at the time of the patient's acute decline the attending physician spoke with the daughter and explained the exceedingly low likelihood the patient would survive.  It was agreed by the attending physician in the patient's daughter that no CODE BLUE/DO NOT RESUSCITATE status was most appropriate.  The patient was provided with as needed morphine via boluses as well as baseline low dose morphine to combat air hunger and pain.  At 13:44 on November 03, 2014 the patient died in her room with her daughter at her bedside.  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  SignedJoette Catching T  [facility] November 03, 2014, 6:12 PM

## 2014-11-16 NOTE — Progress Notes (Addendum)
[facility] [facility] TEAM 1 - Stepdown/ICU TEAM PROGRESS NOTE  BURNETT LIEBER RWE:[id number] DOB: 1955/05/02 DOA: 10/22/2014 PCP: Donnie Coffin, MD  Admit HPI / Brief Narrative: 59 y.o. female with a history of alcoholic cirrhosis and recently repaired perforated gastric ulcer who presented with leaking from her wound and AKI.  The patient was diagnosed with alcoholic cirrhosis within the last year and started recurrening large volume paracentesis this summer. 3 weeks ago she was admitted for abdominal pain and underwent exploratory laparotomy and then [facility] patch of a ruptured ulcer. She had been at home rehabilitating since the surgery and receiving home health for daily wound packing (without a wound VAC). The day of her admit her home health nurses noted marked increase in drainage from her wound and recommended she come to the ED.   In the ED, the patient was anemic to 6.5 g/dL with blood pressure in the 80s over 40s. She received 1 unit of blood and her hemoglobin increased to 9. She was hypothermic with a rectal temperature of 95, and albumin 1.7, and a creatinine of 5 from a baseline around 1. A CT showed large volume ascites.  HPI/Subjective: The patient has declined significantly overnight.  Her renal function is significantly worse today despite therapy having been initiated for hepatorenal syndrome.  She is less alert/more lethargic.  She is tachypneic and tachycardic with a respiratory rate in the mid to upper 30s and heart rate approaching 130.  She cannot provide a reliable history at the time of my visit.  I have attempted to address goals of care with the patient herself but she is not able to communicate with me in an effective manner or in a manner that convinces me she understands our conversation.  I've called her daughter whom I met in her room yesterday when I explained the gravity of the situation to the patient.  I have explained to the patient's daughter that the patient's condition  has worsened today and that I am afraid she may not survive the day.  I have explained that her renal failure has worsened as have indices of her liver function resulting in a 100% 3 month mortality rate even should she survive her acute decline this morning.  In this situation, and with her not being deemed a candidate for dialysis, I have explained my professional opinion that CPR or mechanical ventilation are inappropriate and inhumane.  The patient's daughter agrees she would not wish for her mother to be put through these measures with no reasonable hope of long-term recovery.  We have agreed that her status should be changed to blue/DO NOT RESUSCITATE.  We will continue ongoing medical care short of this however in hopes that she will have a rally and this will give her more time with her children before she dies.  Assessment/Plan:  Alcoholic Cirrhosis with ascites > fulminant hepatic failure  MELD score is up to 40 overnight, giving her a 100% mortality over 3 months - pt is acutely declining this morning - she is s/p paracentesis with rapid reaccumulation of ascites - I fear she will likely not survive today - I have informed her daughter - we are trying volume expansion w/ NS and albumin - she is now a NCB/DNR  Acute kidney failure- progressive hepatorenal syndrome  Nephrology following - trial of midodrine and octreotide initiated 10/16 - crt has increased significantly over night, portending a grave prognosis   SIRS HR 107 + RR28 at presentation - peritoneal fluid  studies are not c/w SBP - pt remains afebrile - will stopped abx - follow clinically   Recent perforated gastric ulcer S/p repair w/ Phillip Heal patch - open wound healing by secondary intention - Gen Surgery following  Anemia of unclear cause  No clear evidence of acute blood loss - transfused 2 units PRBC thus far - hemoglobin holding steady at present   Omental thickening   Hyperkalemia  Due to acute renal failure -  kayexalate being dosed as needed   Hyponatremia Suspect related to cirrhosis - resolved   Code Status: FULL Family Communication: Spoke with patient and daughter at bedside at length Disposition Plan: SDU  Consultants: GI Gen Surgery Nephrology    Procedures: 10/15 - 1.8L paracentesis   Antibiotics: Ceftaz 10/14 > 10/16 Vancomycin 10/14 > 10/16  DVT prophylaxis: SCDs  Objective: Blood pressure 92/47, pulse 123, temperature 97.8 F (36.6 C), temperature source Oral, resp. rate 31, height 5' 4" (1.626 m), weight 58.6 kg (129 lb 3 oz), SpO2 96 %.  Intake/Output Summary (Last 24 hours) at 11/22/2014 0840 Last data filed at 2014/11/22 0600  Gross per 24 hour  Intake   1150 ml  Output    150 ml  Net   1000 ml   Exam: General: tachypneic w/ RR 30-40 during exam  Lungs: Clear to auscultation bilaterally without wheezes or crackles but with rapid shallow respirations - poor air movement bilateral bases Cardiovascular: Tachycardic at 130 bpm without appreciable murmur gallop rub Abdomen: Obvious further worsening of ascites, distended, large midline wound dressed clean and dry, bowel sounds positive, no rebound Extremities: No significant cyanosis, or clubbing - trace edema bilateral lower extremities  Data Reviewed: Basic Metabolic Panel:  Recent Labs Lab 11/10/2014 1442 10/30/14 0219 10/31/14 0257 11/22/2014 0425  NA 133* 129* 137 140  K 5.2* 5.4* 5.3* 5.8*  CL 102 98* 101 103  CO2 20* 17* 17* 16*  GLUCOSE 105* 74 103* 77  BUN 115* 111* 116* 119*  CREATININE 5.16* 4.92* 5.38* 5.96*  CALCIUM 7.8* 7.6* 8.2* 8.4*    CBC:  Recent Labs Lab 11/09/2014 1442 10/30/14 0219 10/30/14 2046 10/31/14 0257 11/22/14 0425  WBC 11.3* 7.3 9.4 11.5* 19.2*  NEUTROABS 9.2*  --   --   --   --   HGB 6.5* 7.6* 8.8* 8.8* 8.9*  HCT 20.4* 23.4* 26.7* 27.0* 27.7*  MCV 78.2 80.4 79.9 80.4 81.2  PLT 168 147* 115* 142* 174    Liver Function Tests:  Recent Labs Lab 11/08/2014 1442  10/30/14 0219 10/31/14 0257 11-22-2014 0425  AST 61* 56* 52* 88*  ALT _0 ALKPHOS 258* 270* 252* 248*  BILITOT 2.3* 2.3* 3.3* 3.9*  PROT 6.0* 6.1* 6.4* 6.1*  ALBUMIN 1.7* 1.9* 2.1* 2.0*    Recent Labs Lab 10/28/2014 1542  LIPASE 30    Recent Labs Lab 10/31/14 0257  AMMONIA 23    Coags:  Recent Labs Lab 10/19/2014 1607 10/30/14 0219 2014/11/22 0425  INR 1.77* 1.64* 2.84*    Recent Results (from the past 240 hour(s))  Blood culture (routine x 2)     Status: None (Preliminary result)   Collection Time: 11/07/2014  5:28 PM  Result Value Ref Range Status   Specimen Description BLOOD RIGHT WRIST  Final   Special Requests BOTTLES DRAWN AEROBIC AND ANAEROBIC 5CC  Final   Culture NO GROWTH 2 DAYS  Final   Report Status PENDING  Incomplete  Urine culture     Status: None  Collection Time: 11/06/2014  5:35 PM  Result Value Ref Range Status   Specimen Description URINE, CATHETERIZED  Final   Special Requests NONE  Final   Culture NO GROWTH 2 DAYS  Final   Report Status 10/31/2014 FINAL  Final  Gram stain     Status: None   Collection Time: 11/13/2014  5:35 PM  Result Value Ref Range Status   Specimen Description URINE, CATHETERIZED  Final   Special Requests NONE  Final   Gram Stain   Final    WBC PRESENT,BOTH PMN AND MONONUCLEAR NO ORGANISMS SEEN CYTOSPIN    Report Status 11/03/2014 FINAL  Final  Blood culture (routine x 2)     Status: None (Preliminary result)   Collection Time: 11/13/2014  6:16 PM  Result Value Ref Range Status   Specimen Description BLOOD RIGHT HAND  Final   Special Requests IN PEDIATRIC BOTTLE 3CC  Final   Culture NO GROWTH 2 DAYS  Final   Report Status PENDING  Incomplete  Culture, body fluid-bottle     Status: None (Preliminary result)   Collection Time: 10/30/14 11:33 AM  Result Value Ref Range Status   Specimen Description FLUID ABDOMEN PERITONEAL  Final   Special Requests BAA 10CCS  Final   Culture NO GROWTH 1 DAY  Final   Report Status  PENDING  Incomplete  Gram stain     Status: None   Collection Time: 10/30/14 11:33 AM  Result Value Ref Range Status   Specimen Description FLUID ABDOMEN PERITONEAL  Final   Special Requests NONE  Final   Gram Stain   Final    FEW WBC PRESENT,BOTH PMN AND MONONUCLEAR NO ORGANISMS SEEN    Report Status 10/30/2014 FINAL  Final     Studies:   Recent x-ray studies have been reviewed in detail by the Attending Physician  Scheduled Meds:  Scheduled Meds: . albumin human  25 g Intravenous Q6H  . bisacodyl  10 mg Rectal Daily  . Chlorhexidine Gluconate Cloth  6 each Topical Q0600  . feeding supplement (ENSURE COMPLETE)  237 mL Oral BID BM  . midodrine  10 mg Oral TID WC  . mupirocin ointment  1 application Nasal BID  . octreotide  100 mcg Subcutaneous TID  . saccharomyces boulardii  250 mg Oral BID  . sodium polystyrene  30 g Oral Once    Time spent on care of this patient: 35 mins   MCCLUNG,JEFFREY T , MD   [facility] Office  [phone] Pager - Text Page per Shea Evans as per below:  On-Call/Text Page:      [email or web address]      password [email or web address]  If 7PM-7AM, please contact night-coverage [email or web address] Password [email or web address] 11/28/14, 8:40 AM   LOS: 3 days

## 2014-11-16 NOTE — Progress Notes (Signed)
   04-Nov-2014 1000  Clinical Encounter Type  Visited With Patient and family together  Visit Type Spiritual support  Referral From Nurse  Spiritual Encounters  Spiritual Needs Prayer;Ritual  Stress Factors  Family Stress Factors Family relationships;Lack of caregivers;Loss  Patient approaching end of life, death imminent according to nurse. Requested last rites; priest being summoned and was en route when chaplain left. Chaplain said prayer, left rosary.

## 2014-11-16 NOTE — Progress Notes (Signed)
Assessment/Plan: 1. AKI, oliguric- in setting of hypotension, ABLA, and paracentesis (1.8L) due to worsening ascites/LFT's most worrisome for hepatorenal. She appears intravascularly depleted with diminished skin turgor and no edema in setting of hypoalbuminemia.  Will give colloid. 2. Hyperkalemia- due to #1. Give kayexalate  3. ? Hepatorenal Syndrome/worsening ascites/cirrhosis. On octreotide and midodrine. Add colloid 4. ABLA- s/p blood transfusion with improved Hgb. Continue to follow and transfuse prn 5. Nephrolithiasis- right kidney non-obstructive 6. Omental thickening- 7. Protein and caloric malnutrition- per primary svc   Subjective: Interval History: c/o SOB  Objective: Vital signs in last 24 hours: Temp:  [97.7 F (36.5 C)-98.4 F (36.9 C)] 97.8 F (36.6 C) (10/17 0324) Pulse Rate:  [107-123] 123 (10/17 0324) Resp:  [19-31] 31 (10/17 0609) BP: (92-101)/(43-54) 92/47 mmHg (10/17 0324) SpO2:  [95 %-97 %] 96 % (10/17 0324) Weight change:   Intake/Output from previous day: 10/16 0701 - 10/17 0700 In: 1150 [I.V.:1150] Out: 150 [Urine:150] Intake/Output this shift:    General appearance: alert Chest wall: no tenderness GI: distended with ascites fluid Extremities: no edema  Reduced skin turgor  Lab Results:  Recent Labs  10/31/14 0257 November 24, 2014 0425  WBC 11.5* 19.2*  HGB 8.8* 8.9*  HCT 27.0* 27.7*  PLT 142* 174   BMET:  Recent Labs  10/31/14 0257 2014-11-24 0425  NA 137 140  K 5.3* 5.8*  CL 101 103  CO2 17* 16*  GLUCOSE 103* 77  BUN 116* 119*  CREATININE 5.38* 5.96*  CALCIUM 8.2* 8.4*   No results for input(s): PTH in the last 72 hours. Iron Studies: No results for input(s): IRON, TIBC, TRANSFERRIN, FERRITIN in the last 72 hours. Studies/Results: US Paracentesis  10/30/2014  CLINICAL DATA:  Cirrhosis and ascites EXAM: ULTRASOUND GUIDED PARACENTESIS TECHNIQUE: Survey ultrasound of the abdomen was performed and an appropriate skin entry site in  the left lower abdomen was selected. Skin site was marked, prepped with Betadine, and draped in usual sterile fashion, and infiltrated locally with 1% lidocaine. A 5 French multisidehole Yueh sheath needle was advanced into the peritoneal space until fluid could be aspirated. The sheath was advanced and the needle removed. 1.8 L of clearascites were aspirated. COMPLICATIONS: None IMPRESSION: Technically successful ultrasound guided paracentesis, removing 1.8 L of ascites. Electronically Signed   By: Marybelle Killings M.D.   On: 10/30/2014 14:45    Scheduled: . bisacodyl  10 mg Rectal Daily  . Chlorhexidine Gluconate Cloth  6 each Topical Q0600  . feeding supplement (ENSURE COMPLETE)  237 mL Oral BID BM  . midodrine  10 mg Oral TID WC  . mupirocin ointment  1 application Nasal BID  . octreotide  100 mcg Subcutaneous TID  . saccharomyces boulardii  250 mg Oral BID   Continuous: . sodium chloride 50 mL/hr at 10/30/14 2013     LOS: 3 days   Jeniyah Menor C November 24, 2014,7:40 AM

## 2014-11-16 NOTE — Progress Notes (Signed)
Time of death 1344. Verified by this RN and Val Riles, RN. Daughter present at bedside. Dr. Thereasa Solo on unit, notified of TOD. Emotional care offered to family, chaplin called.

## 2014-11-16 NOTE — Consult Note (Signed)
WOC wound consult note Reason for Consult: Consult requested to apply Vac dressing to midline abd wound.  Bedside nurses applied this weekend. Wound type: Full thickness post-op wound Measurement: 15X5X.2cm Wound bed: 15% yellow slough, 85% red Drainage (amount, consistency, odor) Scant amt yellow drainage, no odor Periwound: Intact skin surrounding.  Very distended tight abd with protruding umbilicus. Dressing procedure/placement/frequency: Applied Mepitel contact layer and 3 pieces black foam to 19mm cont suction.  Pt tolerated without c/o pain.  Plan for bedside nurse to change Q M/W/F Please re-consult if further assistance is needed.  Thank-you,  Julien Girt MSN, [place], [place], [place], [place]

## 2014-11-16 NOTE — Progress Notes (Signed)
Physician notified: McClung At: 0800  Regarding: BP 70sys. RR 30, ST. FC MD at bedside.  1150: order for PRN morphine--air hunger? pt tachypnea. BP 68/37, RR 42 shallow, more confused and slightly restless. Comfort care measures?

## 2014-11-16 NOTE — Progress Notes (Signed)
Wasted MSO4 gtt bag down sink with Pricilla Holm, RN.

## 2014-11-16 NOTE — Progress Notes (Signed)
   11-15-14 1404  Clinical Encounter Type  Visited With Patient and family together  Visit Type Initial;Death  Referral From Nurse  Spiritual Encounters  Spiritual Needs Emotional  Stress Factors  Family Stress Factors Loss   Chaplain responded to a death. Left patient placement card with the patient's daughter and took her contact information [name] [phone]). Chaplain support available as needed.   Jeri Lager, Chaplain 2014-11-15 2:06 PM

## 2014-11-16 NOTE — Progress Notes (Signed)
Patient ID: MACKIE GOON, female   DOB: 11-05-1955, 59 y.o.   MRN: [id number]    Subjective: Pt c/o some abdominal pain secondary to distention.  Objective: Vital signs in last 24 hours: Temp:  [97.7 F (36.5 C)-98.4 F (36.9 C)] 97.8 F (36.6 C) (10/17 0324) Pulse Rate:  [107-123] 123 (10/17 0324) Resp:  [19-31] 31 (10/17 0609) BP: (92-101)/(43-54) 92/47 mmHg (10/17 0324) SpO2:  [95 %-97 %] 96 % (10/17 0324) Last BM Date: 10/31/14  Intake/Output from previous day: 10/16 0701 - 10/17 0700 In: 1150 [I.V.:1150] Out: 150 [Urine:150] Intake/Output this shift:    PE: Abd: soft, but distended, but with fluid wave present, wound VAC in place with no output at all  Lab Results:   Recent Labs  10/31/14 0257 11/23/14 0425  WBC 11.5* 19.2*  HGB 8.8* 8.9*  HCT 27.0* 27.7*  PLT 142* 174   BMET  Recent Labs  10/31/14 0257 November 23, 2014 0425  NA 137 140  K 5.3* 5.8*  CL 101 103  CO2 17* 16*  GLUCOSE 103* 77  BUN 116* 119*  CREATININE 5.38* 5.96*  CALCIUM 8.2* 8.4*   PT/INR  Recent Labs  10/30/14 0219 11-23-14 0425  LABPROT 19.4* 29.4*  INR 1.64* 2.84*   CMP     Component Value Date/Time   NA 140 Nov 23, 2014 0425   NA 145 01/26/2014 0615   K 5.8* November 23, 2014 0425   K 4.2 01/26/2014 0615   CL 103 November 23, 2014 0425   CL 116* 01/26/2014 0615   CO2 16* 11/23/14 0425   CO2 25 01/26/2014 0615   GLUCOSE 77 2014/11/23 0425   GLUCOSE 102* 01/26/2014 0615   BUN 119* 2014-11-23 0425   BUN 4* 01/26/2014 0615   CREATININE 5.96* 2014/11/23 0425   CREATININE 0.68 01/26/2014 0615   CALCIUM 8.4* 11-23-2014 0425   CALCIUM 7.4* 01/26/2014 0615   PROT 6.1* 11/23/2014 0425   PROT 5.9* 01/26/2014 0615   ALBUMIN 2.0* 2014-11-23 0425   ALBUMIN 2.3* 01/26/2014 0615   AST 88* 23-Nov-2014 0425   AST 207* 01/26/2014 0615   ALT 22 11-23-14 0425   ALT 54 01/26/2014 0615   ALKPHOS 248* 2014/11/23 0425   ALKPHOS 252* 01/26/2014 0615   BILITOT 3.9* 2014-11-23 0425   BILITOT 0.9  01/26/2014 0615   GFRNONAA 7* 11-23-2014 0425   GFRNONAA >60 01/26/2014 0615   GFRAA 8* 2014-11-23 0425   GFRAA >60 01/26/2014 0615   Lipase     Component Value Date/Time   LIPASE 30 10/30/2014 1542       Studies/Results: US Paracentesis  10/30/2014  CLINICAL DATA:  Cirrhosis and ascites EXAM: ULTRASOUND GUIDED PARACENTESIS TECHNIQUE: Survey ultrasound of the abdomen was performed and an appropriate skin entry site in the left lower abdomen was selected. Skin site was marked, prepped with Betadine, and draped in usual sterile fashion, and infiltrated locally with 1% lidocaine. A 5 French multisidehole Yueh sheath needle was advanced into the peritoneal space until fluid could be aspirated. The sheath was advanced and the needle removed. 1.8 L of clearascites were aspirated. COMPLICATIONS: None IMPRESSION: Technically successful ultrasound guided paracentesis, removing 1.8 L of ascites. Electronically Signed   By: Marybelle Killings M.D.   On: 10/30/2014 14:45    Anti-infectives: Anti-infectives    Start     Dose/Rate Route Frequency Ordered Stop   10/31/14 2200  vancomycin (VANCOCIN) IVPB 1000 mg/200 mL premix  Status:  Discontinued     1,000 mg 200 mL/hr over 60 Minutes Intravenous  Every 48 hours 11/03/2014 2131 10/31/14 1725   10/31/2014 2145  vancomycin (VANCOCIN) 1,250 mg in sodium chloride 0.9 % 250 mL IVPB     1,250 mg 166.7 mL/hr over 90 Minutes Intravenous  Once 11/11/2014 2126 11/11/2014 2342   10/28/2014 2130  cefTAZidime (FORTAZ) 2 g in dextrose 5 % 50 mL IVPB  Status:  Discontinued     2 g 100 mL/hr over 30 Minutes Intravenous Every 48 hours 10/24/2014 2125 10/31/14 1725       Assessment/Plan   1. S/p ex lap with graham patch for perf DU, 9/25, now with ascitic leak -drainage has dropped to none essentially with wound VAC in place -MWF VAC changes to help with wound healing and closure and to control leakage if begins again -no other acute surgical issues currently.  Will follow  every couple of days.  LOS: 3 days    Tima Curet E November 19, 2014, 7:49 AM Pager: [phone]

## 2014-11-16 DEATH — deceased

## 2014-11-18 ENCOUNTER — Ambulatory Visit: Payer: Medicaid Other

## 2014-12-02 ENCOUNTER — Ambulatory Visit: Payer: Medicaid Other

## 2016-06-01 IMAGING — US US ABDOMEN LIMITED
1 series · 9 of 9 positions shown · non-contrast
Comparison: May 24, 2014.

CLINICAL DATA: Ascites.  Alcoholic cirrhosis of liver.

EXAM:
LIMITED ABDOMEN ULTRASOUND FOR ASCITES
TECHNIQUE: Limited ultrasound survey for ascites was performed in all four
abdominal quadrants.

[Series 1: us abdomen limited · 0.24mm/px · 9 of 9 slices shown]
[im 1/9]
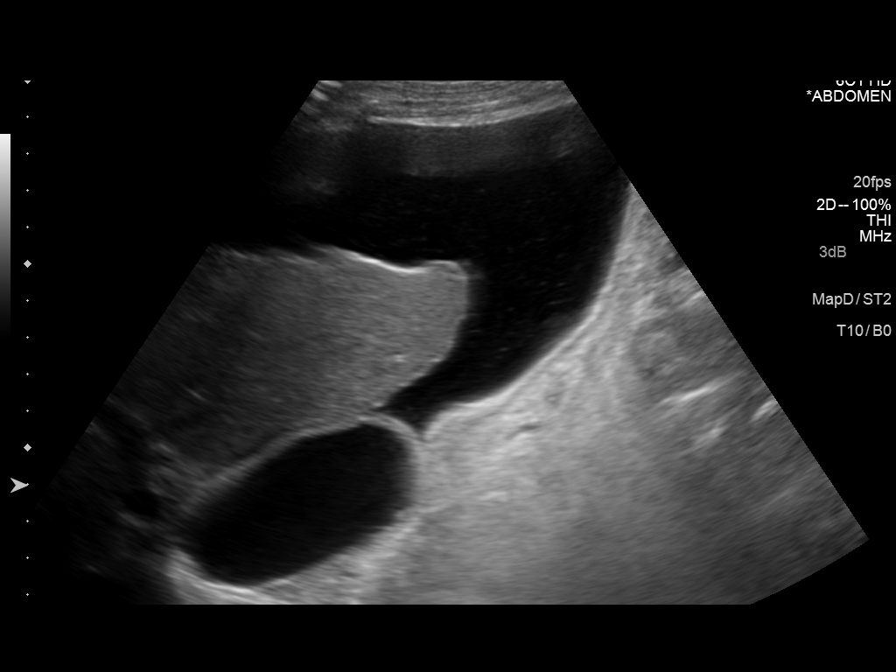
[im 2/9]
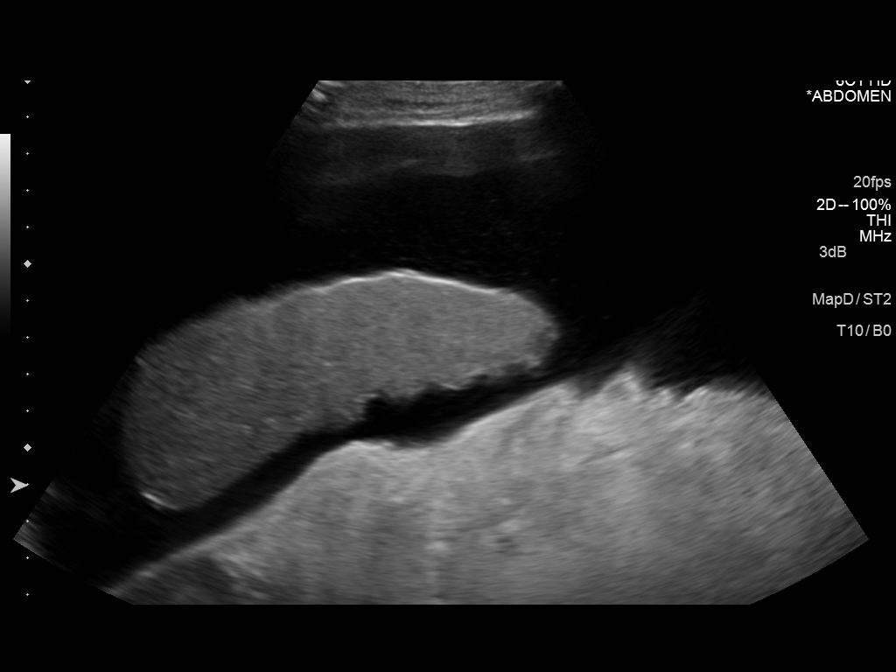
[im 3/9]
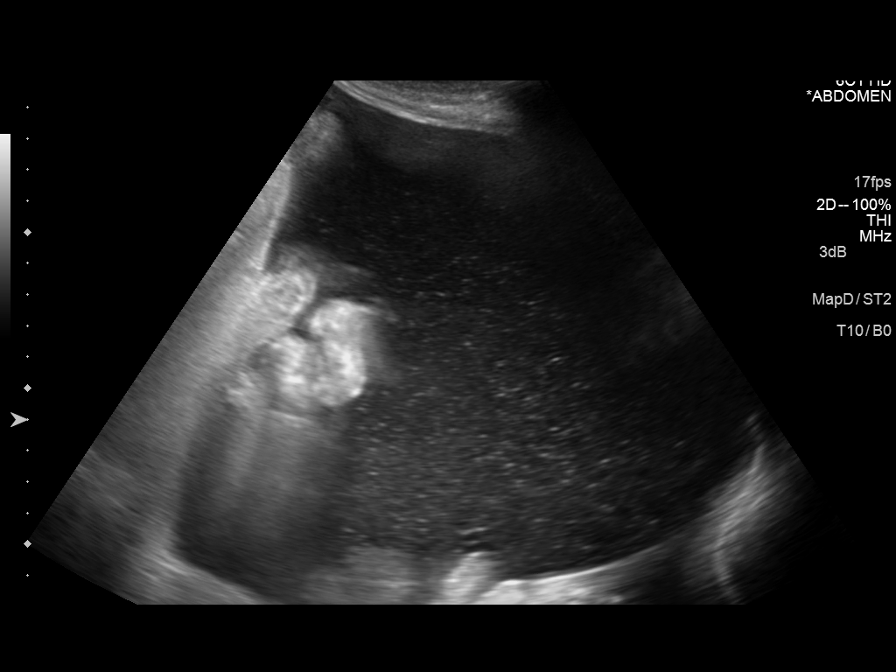
[im 4/9]
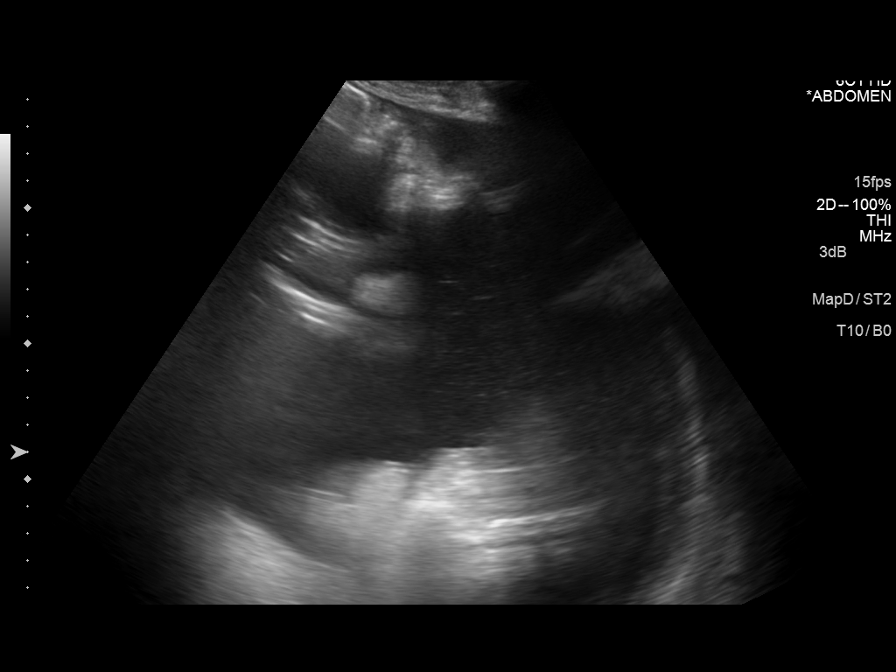
[im 5/9]
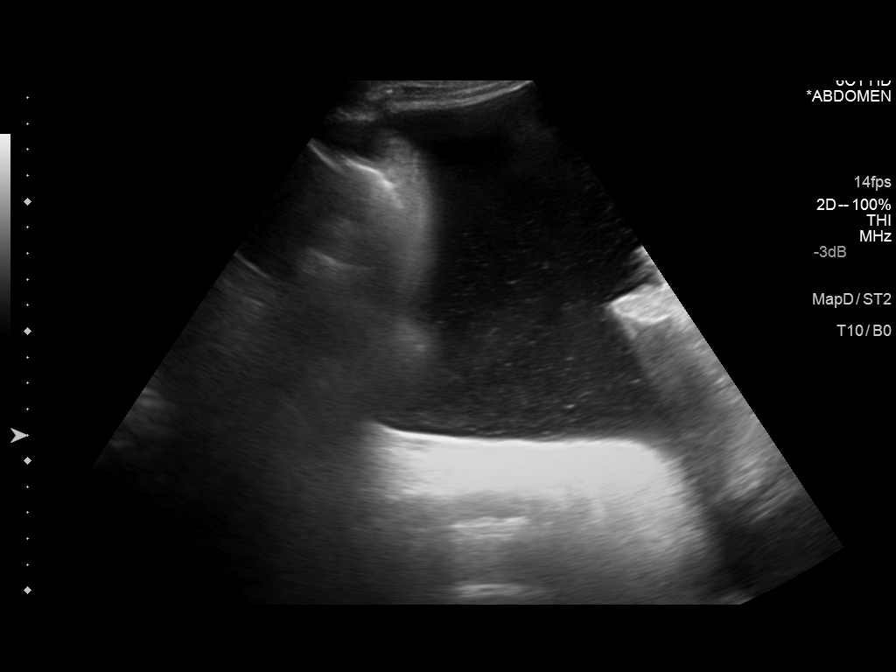
[im 6/9]
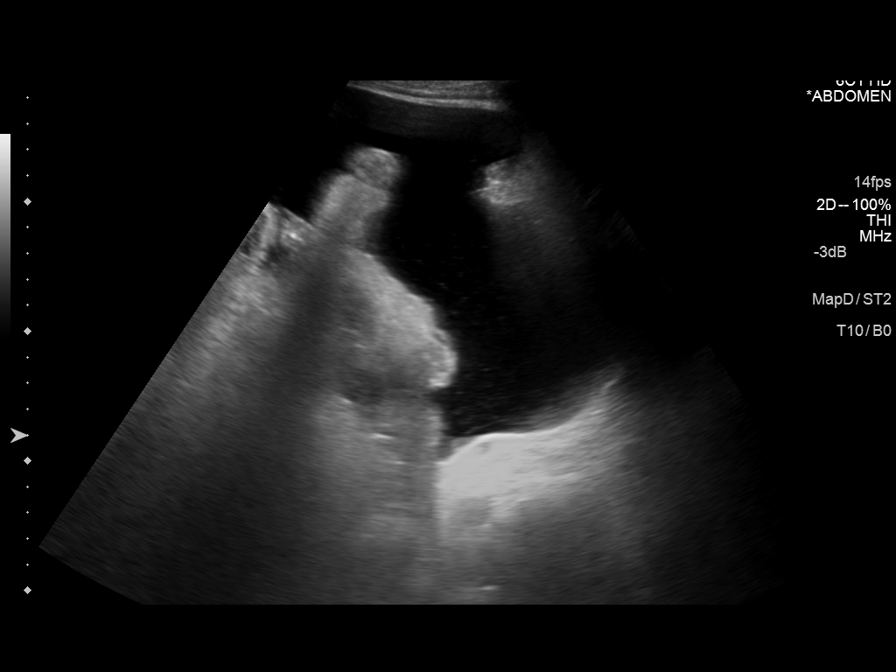
[im 7/9]
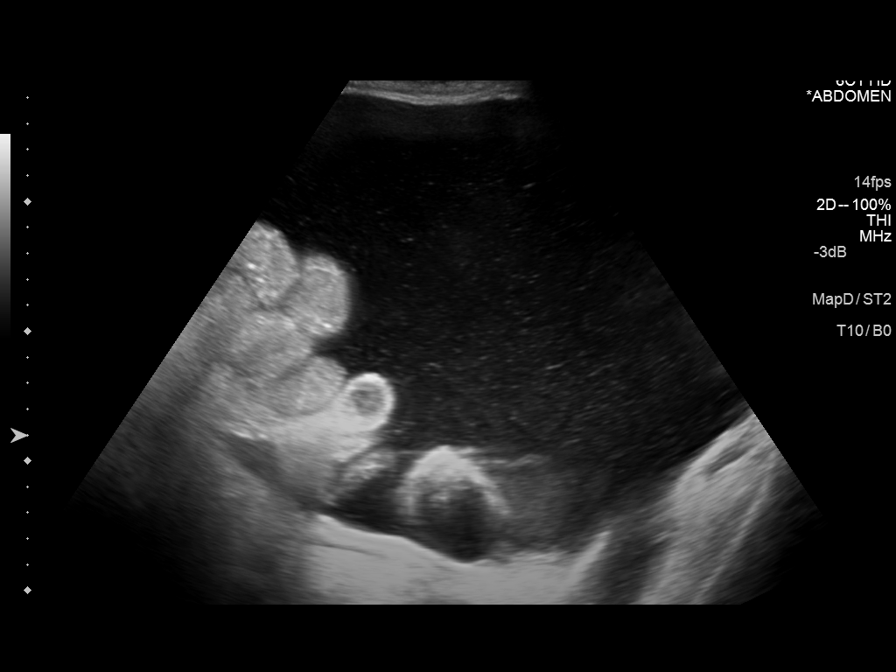
[im 8/9]
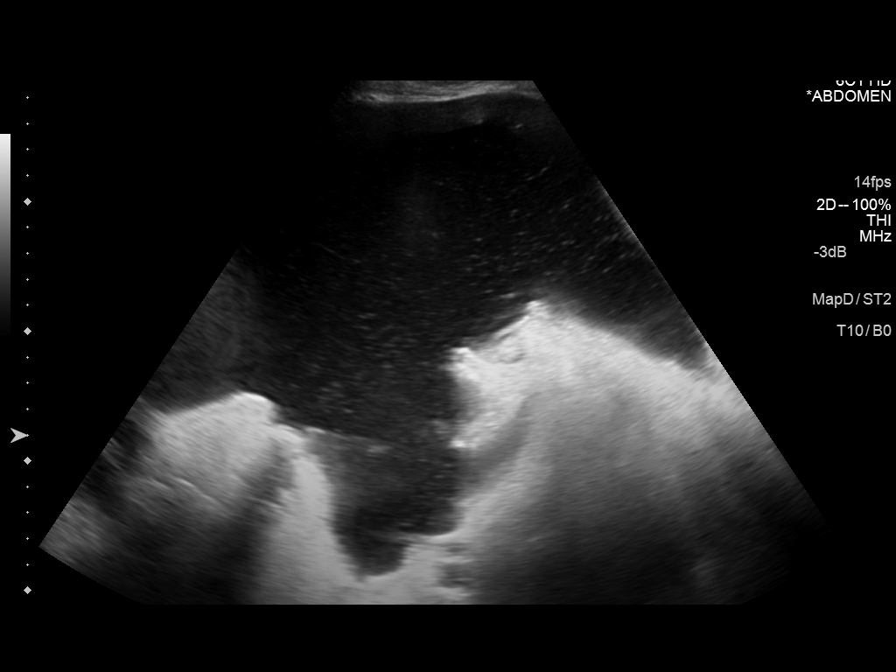
[im 9/9]
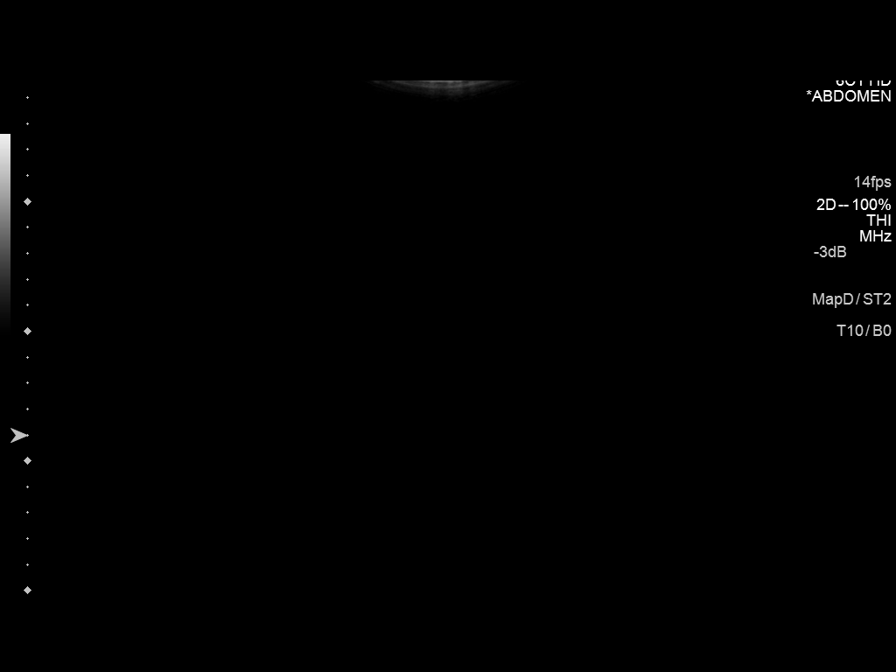

[9 of 9 positions shown; findings below may reference images not displayed]

FINDINGS: Moderate to large amount of ascites is noted in all 4 quadrants.
However, due to patient's hypotension. Paracentesis could not be
performed.
IMPRESSION: Significant amount of ascites is present, but paracentesis cannot be
performed due to patient's hypertension. Dr. [REDACTED] was
notified of this by myself personally.

## 2016-09-21 IMAGING — CT CT ABD-PELV W/O CM
2 of 4 series · 14 of 46 positions shown, 16 images · non-contrast
Comparison: CT the abdomen and pelvis 10/10/2014.

CLINICAL DATA: 59-year-old female with recent surgery for
perforated ulcer. Abdominal distention. Draining wound. Hypotension.

EXAM:
CT ABDOMEN AND PELVIS WITHOUT CONTRAST
TECHNIQUE: Multidetector CT imaging of the abdomen and pelvis was performed
following the standard protocol without IV contrast.

[Series 5: abd/ pelvis 5.0 i30f 1 · axial · 0.77mm/px · z∈[+841,+1271]mm · 11 of 102 slices shown, 13 images]
[im 8/102  soft-tissue]
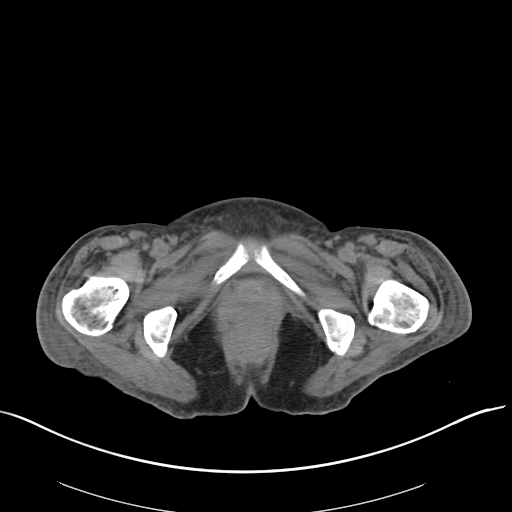
[im 8/102  bone]
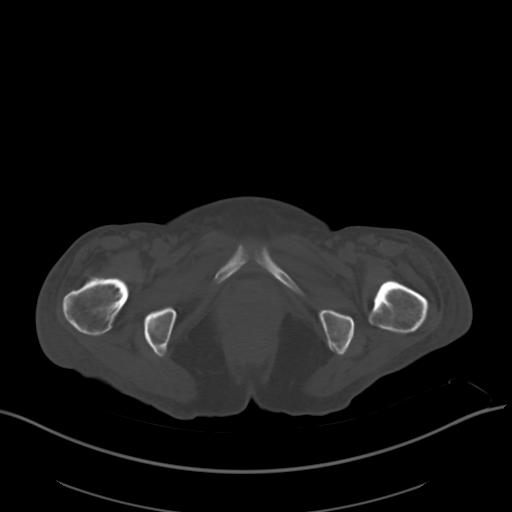
[im 16/102  soft-tissue]
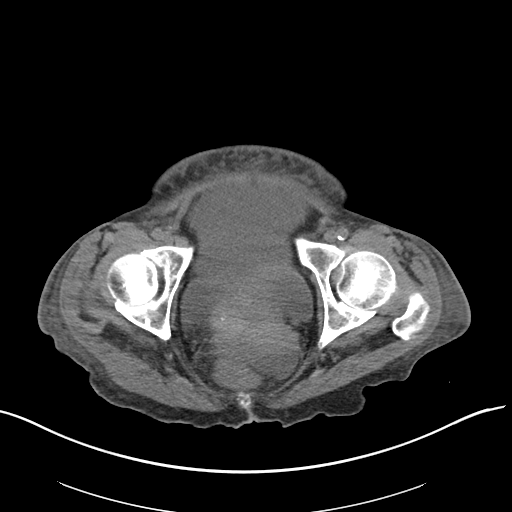
[im 24/102  soft-tissue]
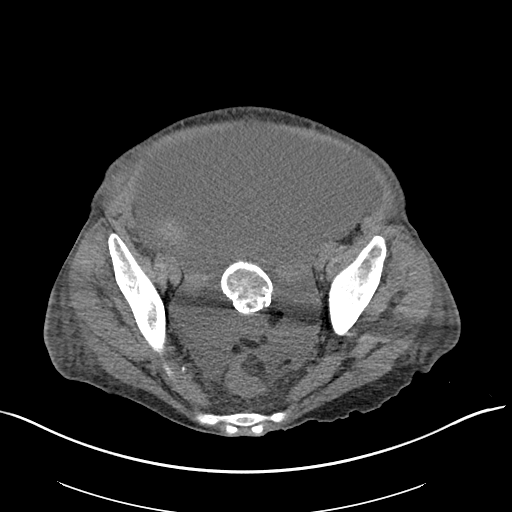
[im 32/102  soft-tissue]
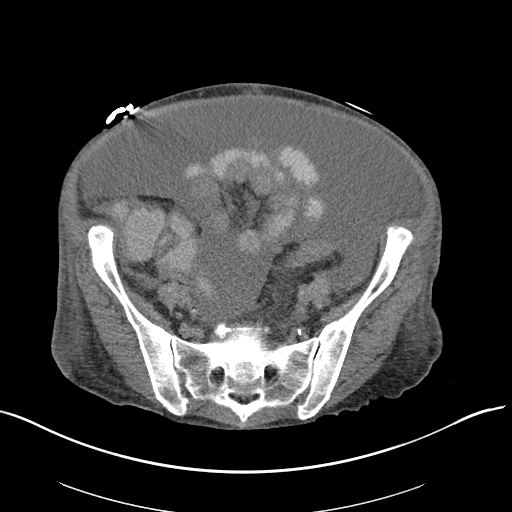
[im 43/102  soft-tissue]
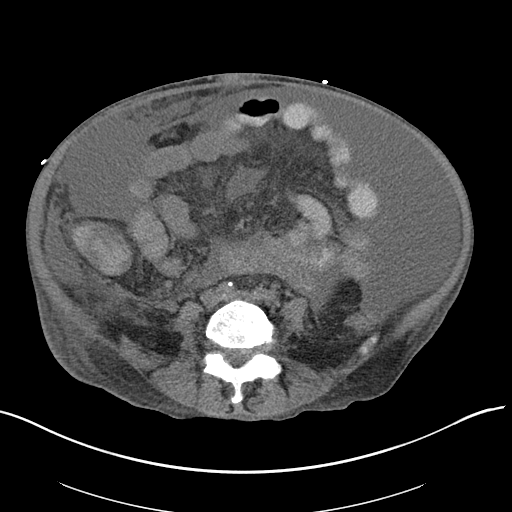
[im 51/102  soft-tissue]
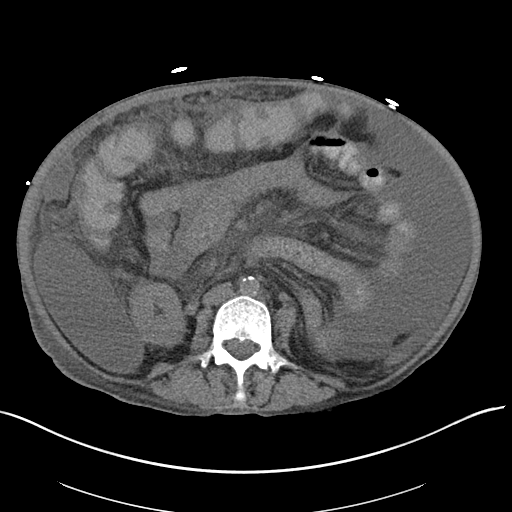
[im 59/102  soft-tissue]
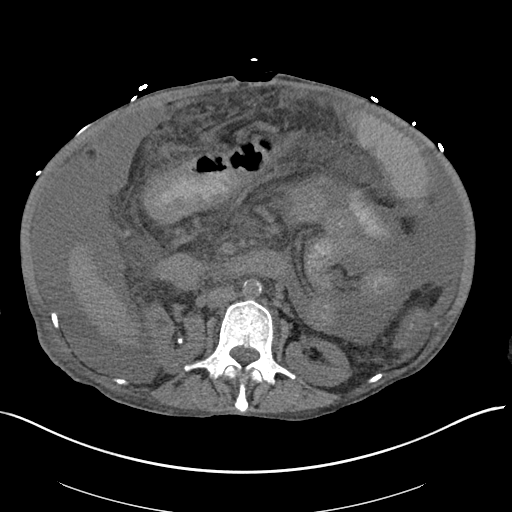
[im 70/102  soft-tissue]
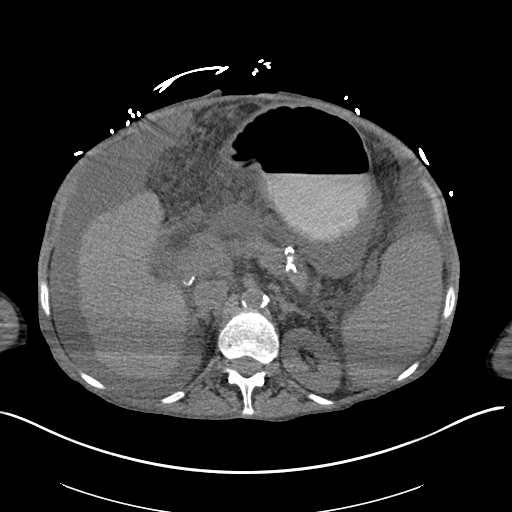
[im 78/102  soft-tissue]
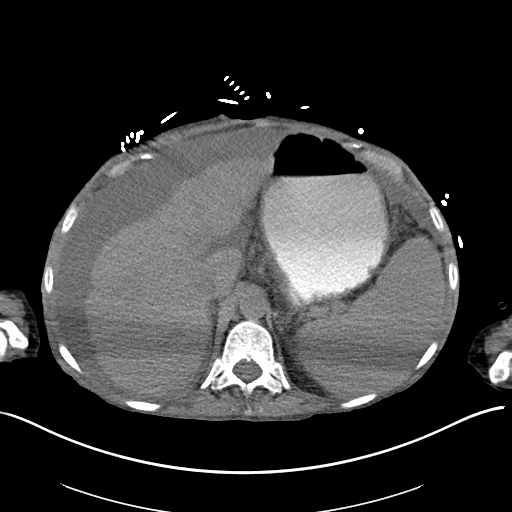
[im 78/102  bone]
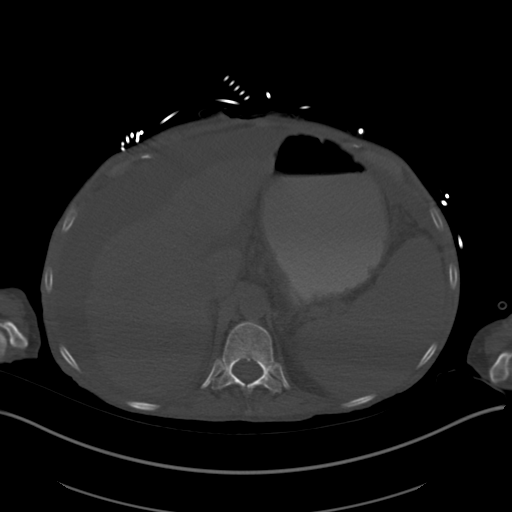
[im 86/102  soft-tissue]
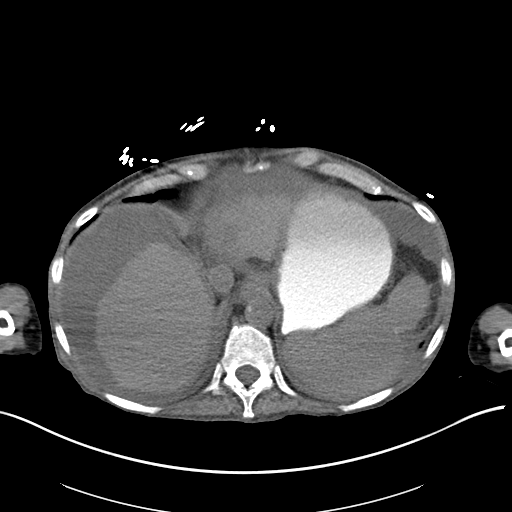
[im 94/102  soft-tissue]
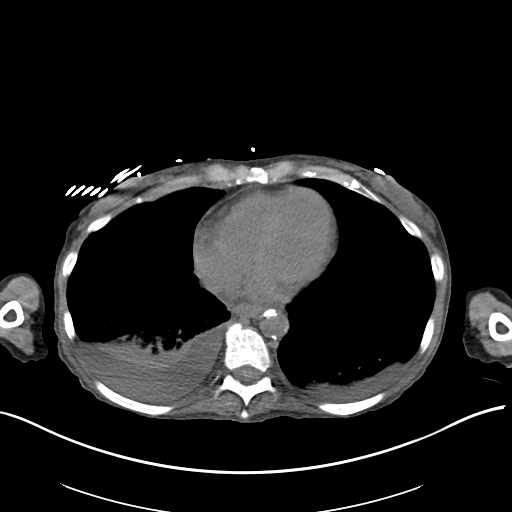

[Series 7: cor st · coronal · 0.87mm/px · 3 of 93 slices shown]
[im 31/93  soft-tissue]
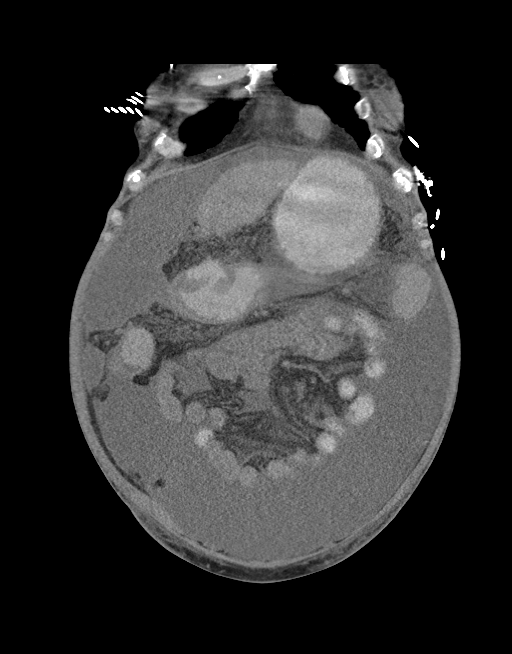
[im 41/93  soft-tissue]
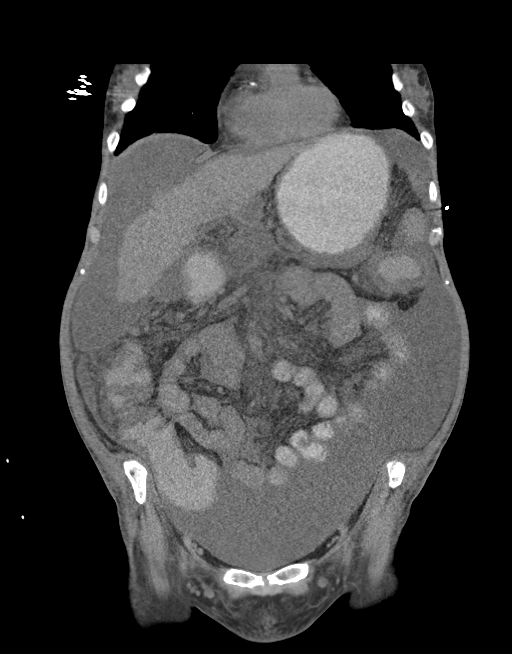
[im 52/93  soft-tissue]
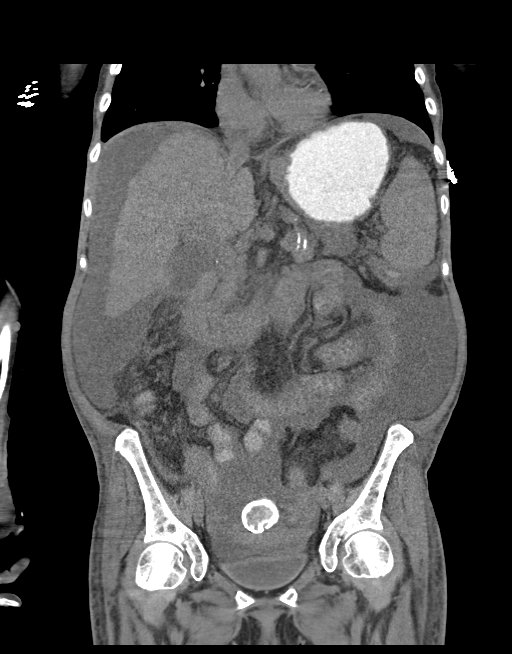

[14 of 46 positions shown; findings below may reference images not displayed]

FINDINGS: Lower chest: Moderate right and small left pleural effusions lying
dependently. Atherosclerotic calcifications in the left anterior
descending, left circumflex and right coronary arteries.

Hepatobiliary: The liver has a shrunken appearance and nodular
contour, compatible with underlying cirrhosis. No discrete hepatic
lesion confidently identified on today's noncontrast CT examination.
The unenhanced appearance of the gallbladder is unremarkable.

Pancreas: No definite pancreatic mass identified on today's
noncontrast CT examination.

Spleen: Spleen is enlarged measuring 13.8 x 7.2 x 15.6 (estimated
splenic volume of 775 mL).

Adrenals/Urinary Tract: Several nonobstructive calculi are present
within the right renal collecting system, the largest of which
measures 6 mm in the upper pole. Some no additional calculi are
identified in the left renal collecting system, along the course of
either ureter or within the lumen of the urinary bladder. No
hydroureteronephrosis to suggest urinary tract obstruction at this
time. Urinary bladder is normal in appearance.

Stomach/Bowel: There is profound thickening of the gastric wall in
the antral pre-pyloric region of the stomach, best appreciated on
image 41 of series 5. No pathologic dilatation of small bowel or
colon.

Vascular/Lymphatic: Atherosclerosis throughout the abdominal and
pelvic vasculature, without evidence of aneurysm. No definite
lymphadenopathy noted in the abdomen or pelvis on today's
noncontrast CT examination.

Reproductive: Coarsely calcified lesion measuring 4.1 cm in the
fundus of the uterus, presumably a calcified fibroid. Ovaries are
poorly demonstrated on today's noncontrast CT examination.

Other: Large volume of ascites. Extensive nodular thickening of the
omentum, which could simply be related to portosystemic collateral
vessels, but is suspicious for potential peritoneal spread of
malignancy. Some areas of peritoneal thickening and mild nodularity
are also noted, but poorly depicted on today's noncontrast CT
(example in the right-sided the abdomen on image 51 of series 5). No
pneumoperitoneum. Small umbilical hernia containing some ascites.

Musculoskeletal: There are no aggressive appearing lytic or blastic
lesions noted in the visualized portions of the skeleton.
IMPRESSION: 1. Profound soft tissue thickening in the region of the gastric
antrum, presumably related to residual inflammation at site of prior
perforated gastric ulcer. No evidence of residual extravasation of
oral contrast material on today's examination.
2. Very nodular appearance of the omentum, concerning for
intraperitoneal spread of malignancy. Alternatively, this may simply
reflect multiple collateral pathways (portosystemic collateral
pathways) in this patient with history of cirrhosis, but correlation
with cytology from nonemergent paracentesis is suggested in the near
future to exclude the presence of malignant intraperitoneal cells.
3. Stigmata of advanced cirrhosis with splenomegaly, suggesting
portal hypertension.
4. Moderate right and small left pleural effusions lying
dependently.
5. Extensive atherosclerosis, including at least 3 vessel coronary
artery disease.
6. Additional incidental findings, as above.

## 2016-09-21 IMAGING — DX DG ABDOMEN ACUTE W/ 1V CHEST
3 series · 3 of 3 positions shown · non-contrast
Comparison: CT abdomen and pelvis 10/10/2014.

CLINICAL DATA: History abdominal surgery last week. Abdominal pain
and distention. Weakness in the lower body.

EXAM:
DG ABDOMEN ACUTE W/ 1V CHEST

[chest pa]
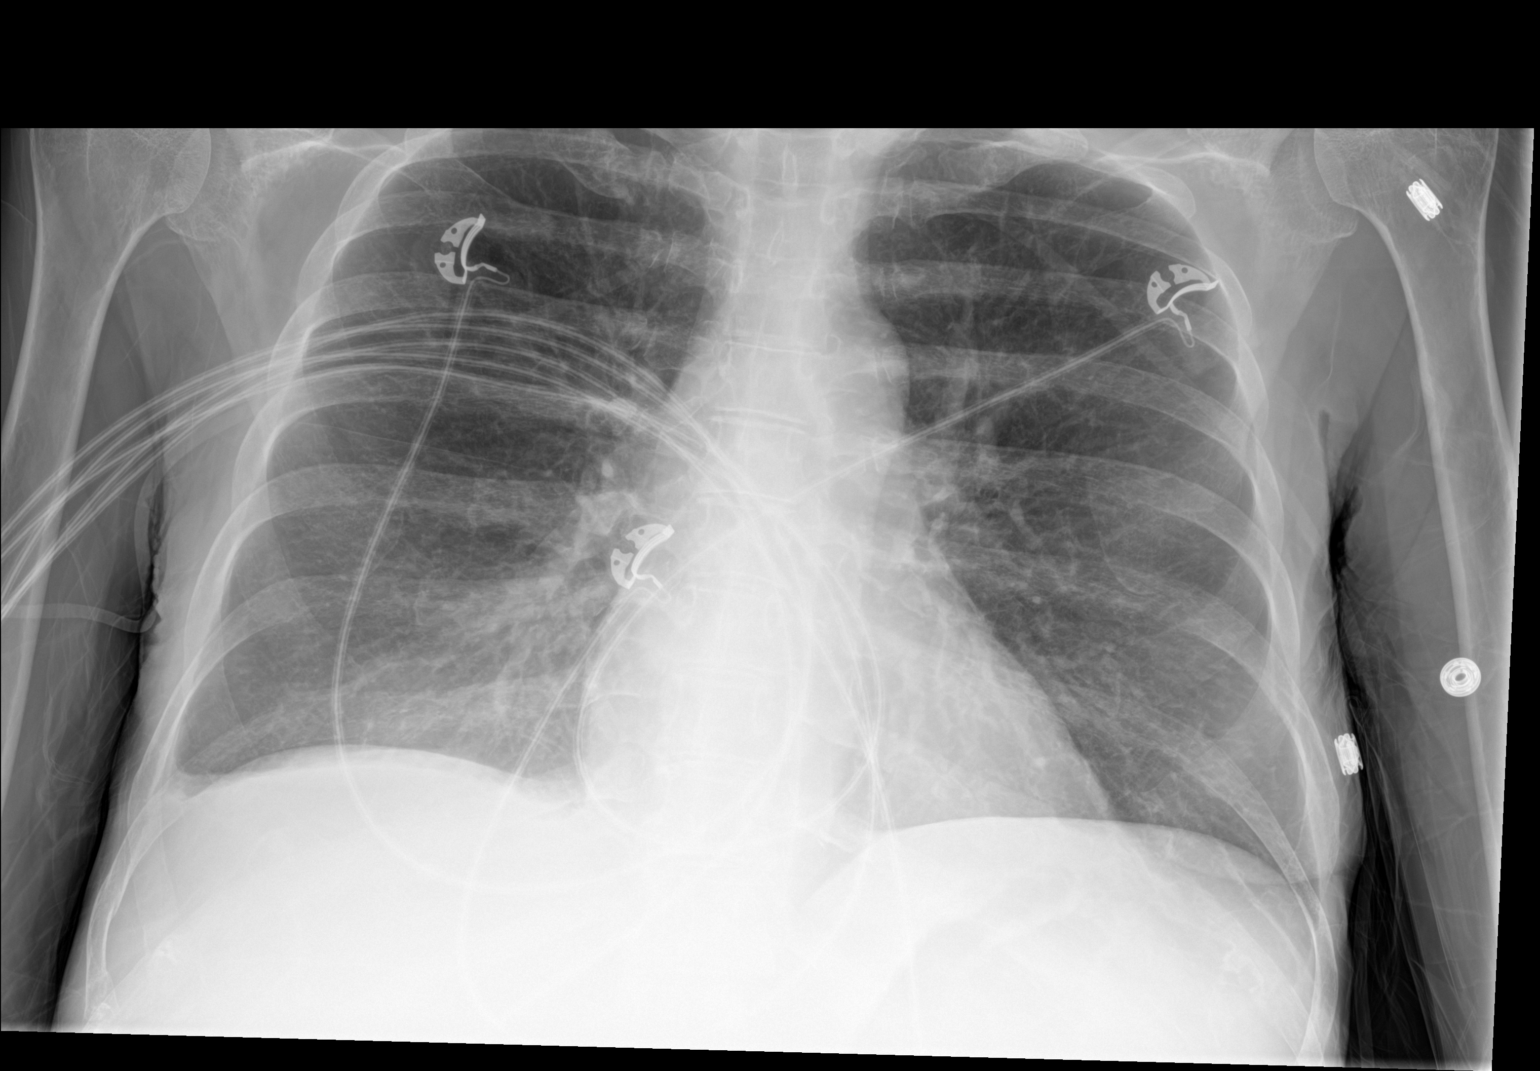

[abdomen supine]
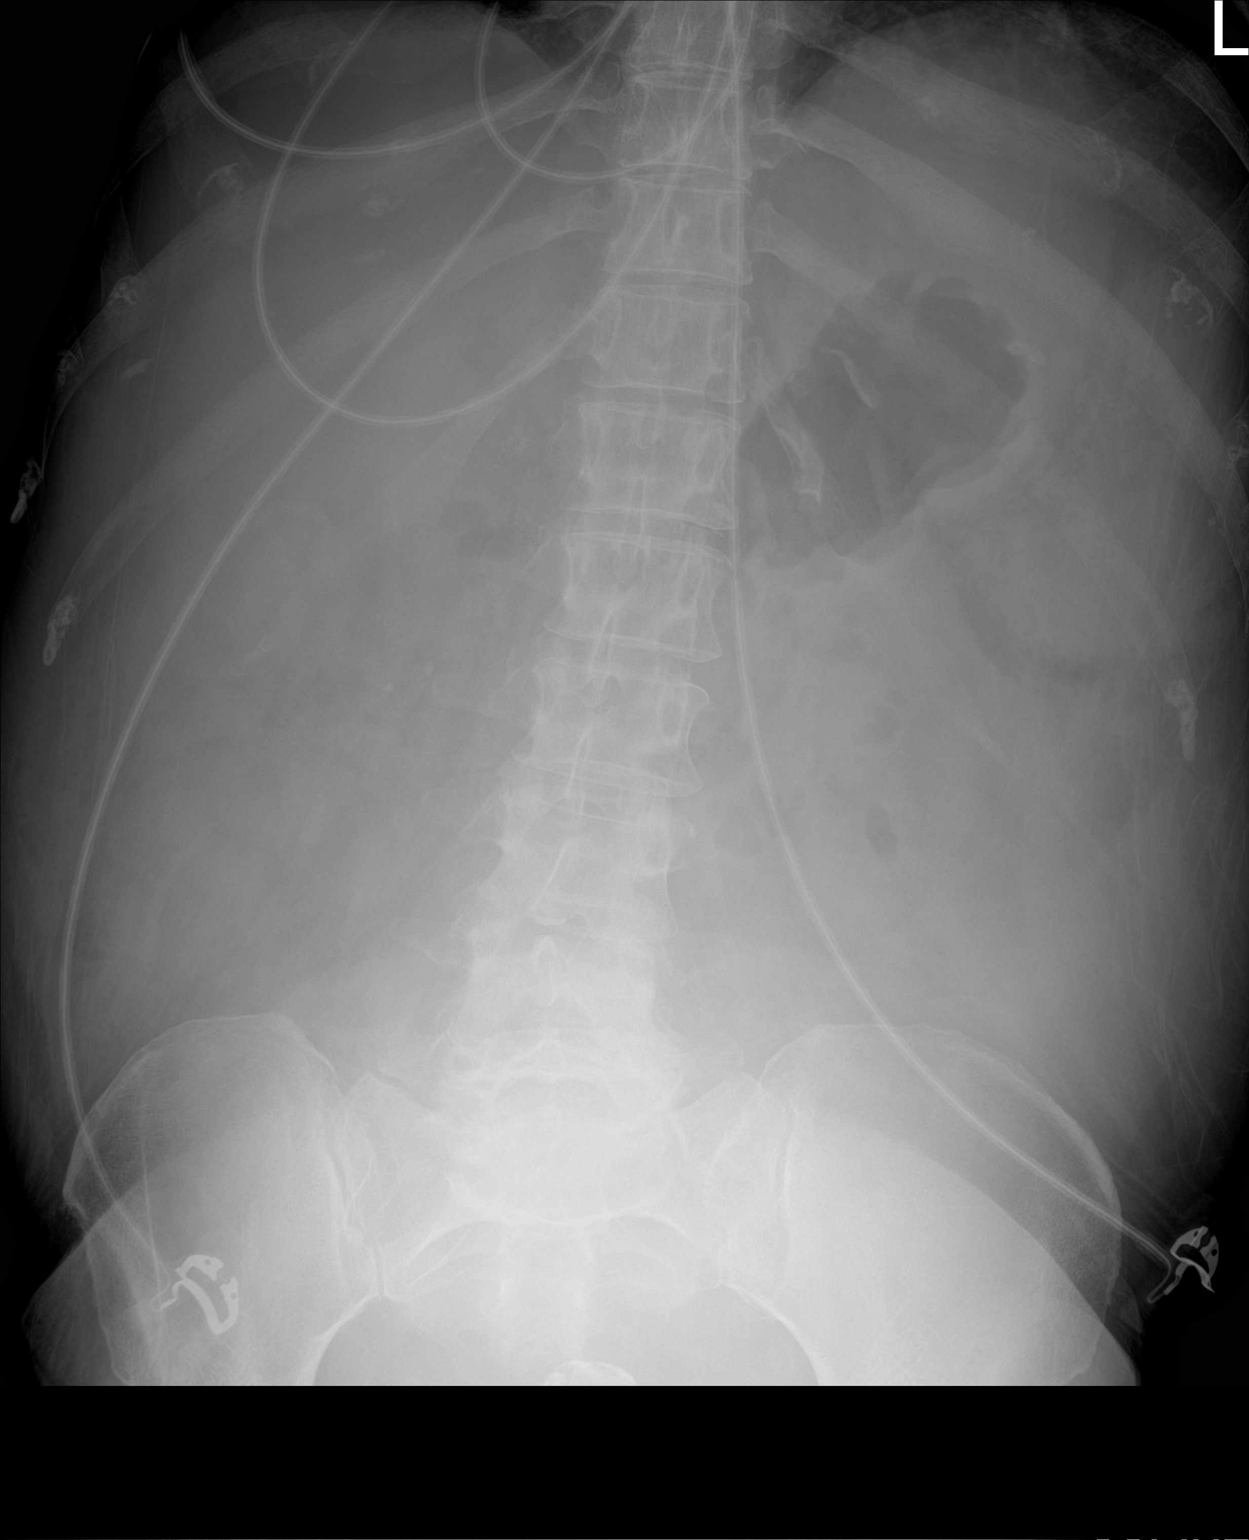

[abdomen decu]
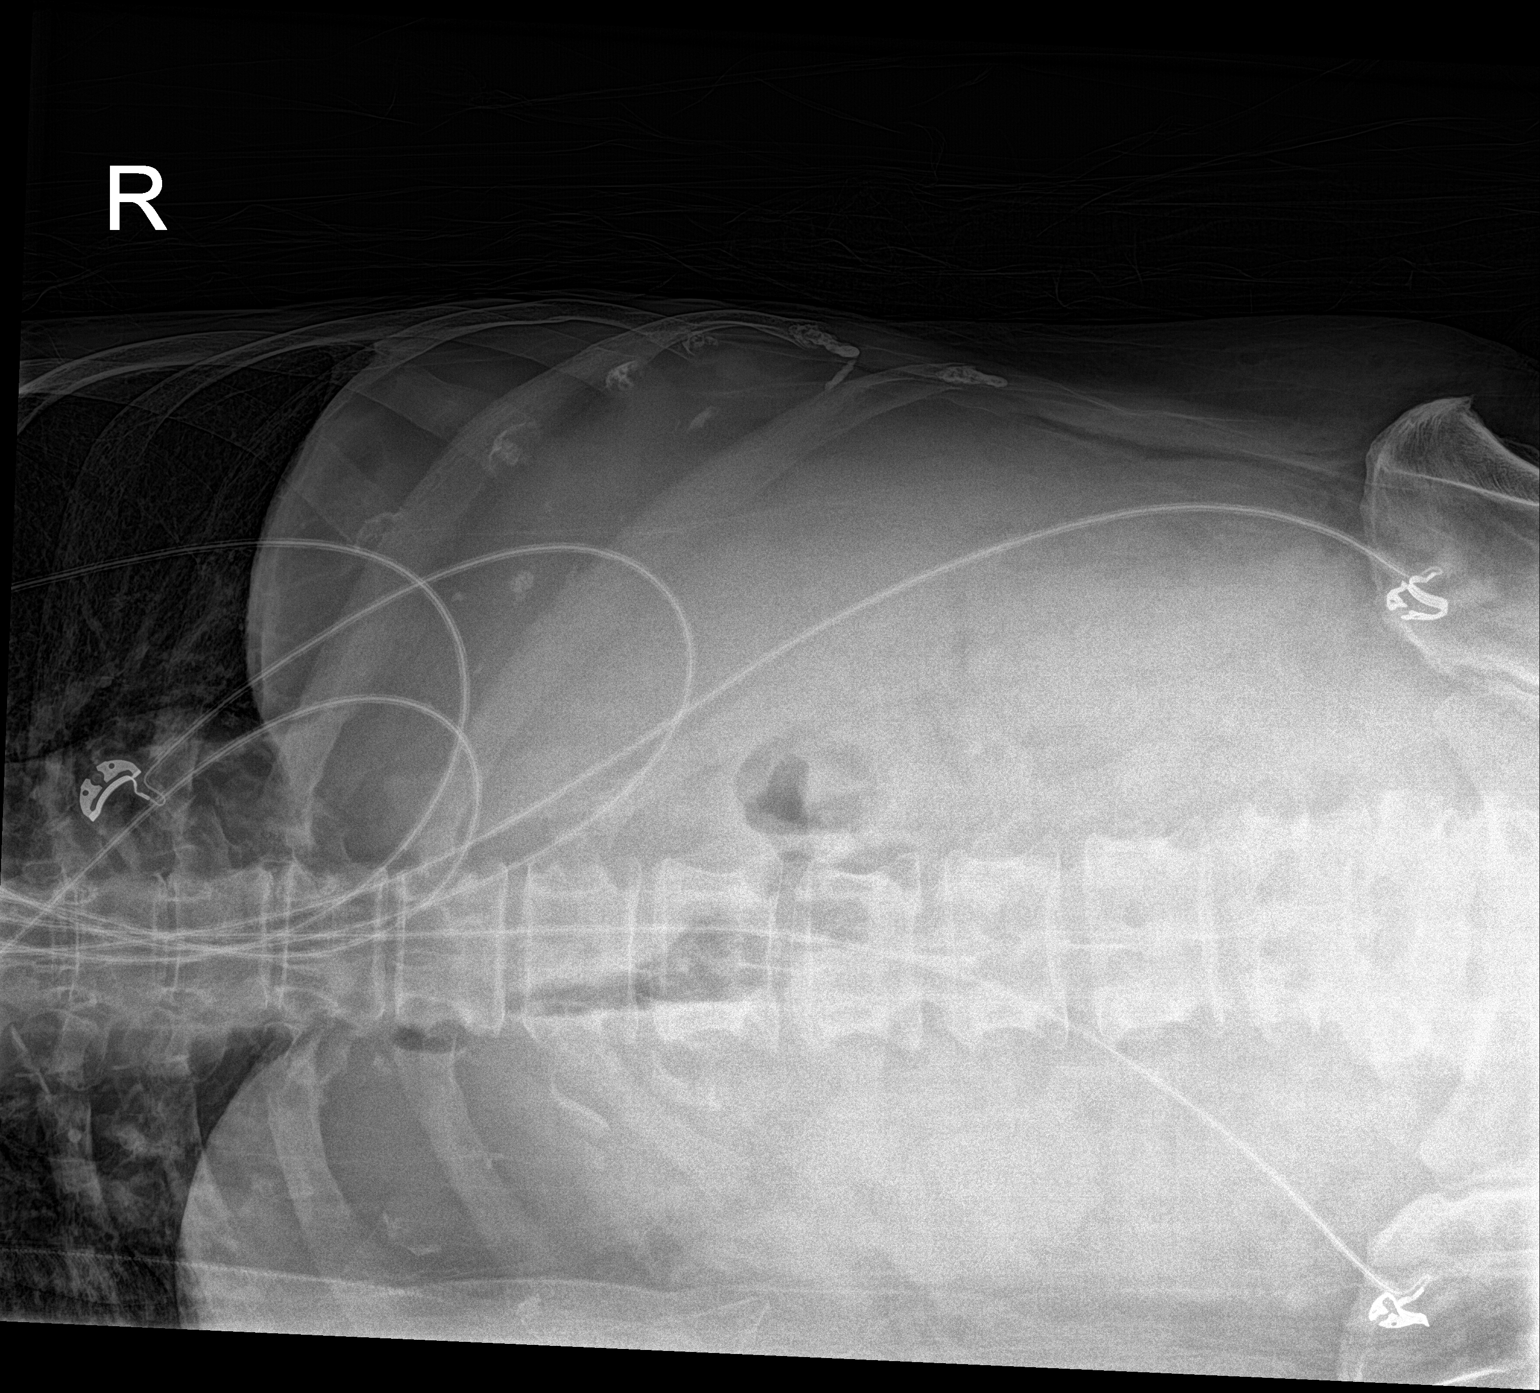

[3 of 3 positions shown; findings below may reference images not displayed]

FINDINGS: The heart size is normal. Chronic interstitial coarsening is
present. Recur and left basilar atelectasis is present.

A relative paucity bowel gas is present. There is no evidence for
obstruction or free air. Degenerative changes are again noted in the
lower lumbar spine. A calcified uterine fibroid is again noted.
IMPRESSION: 1. Bibasilar airspace opacity likely reflects atelectasis.
2. Relative paucity of bowel gas without evidence for obstruction or
free air.
3. Calcified uterine fibroid.
